# Patient Record
Sex: Female | Born: 1995 | Hispanic: Yes | Smoking: Former smoker
Health system: Southern US, Community
[De-identification: ages and names within clinical notes are randomized; demographics above are authoritative.]

## PROBLEM LIST (undated history)

## (undated) ENCOUNTER — Emergency Department (HOSPITAL_COMMUNITY): Discharge status: Against Medical Advice | Payer: Self-pay

## (undated) ENCOUNTER — Inpatient Hospital Stay (HOSPITAL_COMMUNITY): Payer: Self-pay

## (undated) DIAGNOSIS — A749 Chlamydial infection, unspecified: Secondary | ICD-10-CM

## (undated) DIAGNOSIS — T4145XA Adverse effect of unspecified anesthetic, initial encounter: Secondary | ICD-10-CM

## (undated) DIAGNOSIS — F32A Depression, unspecified: Secondary | ICD-10-CM

## (undated) DIAGNOSIS — O139 Gestational [pregnancy-induced] hypertension without significant proteinuria, unspecified trimester: Secondary | ICD-10-CM

## (undated) DIAGNOSIS — T8859XA Other complications of anesthesia, initial encounter: Secondary | ICD-10-CM

## (undated) DIAGNOSIS — F329 Major depressive disorder, single episode, unspecified: Secondary | ICD-10-CM

## (undated) DIAGNOSIS — F319 Bipolar disorder, unspecified: Secondary | ICD-10-CM

## (undated) HISTORY — DX: Major depressive disorder, single episode, unspecified: F32.9

## (undated) HISTORY — DX: Bipolar disorder, unspecified: F31.9

## (undated) HISTORY — DX: Depression, unspecified: F32.A

## (undated) HISTORY — DX: Chlamydial infection, unspecified: A74.9

## (undated) HISTORY — PX: HERNIA REPAIR: SHX51

---
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
## 2014-03-07 ENCOUNTER — Ambulatory Visit: Payer: Self-pay | Admitting: Gynecology

## 2014-03-13 ENCOUNTER — Ambulatory Visit: Payer: Self-pay | Admitting: Gynecology

## 2014-06-19 ENCOUNTER — Telehealth: Payer: Self-pay | Admitting: Gynecology

## 2014-06-19 NOTE — Telephone Encounter (Signed)
06/19/14-Blanca called patient and spoke w/her in Spanish to let her know the cost to remove her IUD is $216.74 as she has not yet met her deductible and that she would have to pay at least $100.00 when she comes in to have it removed towards the balance. Appt was made with JF/wl

## 2014-06-20 ENCOUNTER — Ambulatory Visit: Payer: BLUE CROSS/BLUE SHIELD | Admitting: Gynecology

## 2014-06-30 ENCOUNTER — Emergency Department (HOSPITAL_COMMUNITY)
Admission: EM | Admit: 2014-06-30 | Discharge: 2014-06-30 | Disposition: A | Discharge status: Home / Self Care | Payer: Self-pay | Attending: Emergency Medicine | Admitting: Emergency Medicine

## 2014-06-30 ENCOUNTER — Encounter (HOSPITAL_COMMUNITY): Payer: Self-pay | Admitting: Emergency Medicine

## 2014-06-30 DIAGNOSIS — Z8659 Personal history of other mental and behavioral disorders: Secondary | ICD-10-CM | POA: Insufficient documentation

## 2014-06-30 DIAGNOSIS — J3489 Other specified disorders of nose and nasal sinuses: Secondary | ICD-10-CM | POA: Insufficient documentation

## 2014-06-30 DIAGNOSIS — Z8619 Personal history of other infectious and parasitic diseases: Secondary | ICD-10-CM | POA: Insufficient documentation

## 2014-06-30 DIAGNOSIS — R0981 Nasal congestion: Secondary | ICD-10-CM | POA: Insufficient documentation

## 2014-06-30 DIAGNOSIS — Z87891 Personal history of nicotine dependence: Secondary | ICD-10-CM | POA: Insufficient documentation

## 2014-06-30 DIAGNOSIS — H65191 Other acute nonsuppurative otitis media, right ear: Secondary | ICD-10-CM | POA: Insufficient documentation

## 2014-06-30 MED ORDER — AMOXICILLIN 500 MG PO CAPS: 500.0000 mg | ORAL_CAPSULE | Freq: Two times a day (BID) | ORAL | Status: DC

## 2014-06-30 MED ORDER — IBUPROFEN 800 MG PO TABS: 800.0000 mg | ORAL_TABLET | Freq: Three times a day (TID) | ORAL | Status: DC

## 2014-06-30 NOTE — ED Notes (Signed)
Pt c/o R ear pain onset today and nasal congestion x 3-4 days, today noticed streaks of blood in mucus while blowing her nose.

## 2014-06-30 NOTE — ED Provider Notes (Signed)
CSN: [id number]     Arrival date & time 06/30/14  1755 History  This chart was scribed for non-physician practitioner, Jinny SandersJoseph Aeralyn Barna, PA-C working with Doug SouSam Jacubowitz, MD, by Abel PrestoKara Demonbreun, ED Scribe. This patient was seen in room WTR8/WTR8 and the patient's care was started at 6:32 PM.     Chief Complaint  Patient presents with  . Otalgia    Patient is a 19 y.o. female presenting with ear pain. The history is provided by the patient and the spouse. No language interpreter was used.  Otalgia Associated symptoms: congestion and rhinorrhea   Associated symptoms: no cough, no fever and no sore throat    HPI Comments: Jamie Hansen is a 19 y.o. female who presents to the Emergency Department complaining of right ear pain with onset today. Pt notes associated nasal congestion, sinus pressure, and blood tinged mucus blown from nose with onset 3-4 days ago. Pt with h/o of seasonal allergies. Pt has NKDA. Pt denies fever, chills, nausea, vomiting, cough, neck pain,  Past Medical History  Diagnosis Date  . Depression   . Bipolar 1 disorder   . Chlamydia    Past Surgical History  Procedure Laterality Date  . Cesarean section    . Hernia repair     Family History  Problem Relation Age of Onset  . Cancer Maternal Grandmother    History  Substance Use Topics  . Smoking status: Former Games developermoker  . Smokeless tobacco: Never Used  . Alcohol Use: No   OB History    Gravida Para Term Preterm AB TAB SAB Ectopic Multiple Living   1 1        1      Review of Systems  Constitutional: Negative for fever and chills.  HENT: Positive for congestion, ear pain, rhinorrhea and sinus pressure. Negative for sore throat.   Respiratory: Negative for cough.       Allergies  Review of patient's allergies indicates no known allergies.  Home Medications   Prior to Admission medications   Medication Sig Start Date End Date Taking? Authorizing Provider  amoxicillin (AMOXIL) 500 MG capsule Take 1  capsule (500 mg total) by mouth 2 (two) times daily. 06/30/14   Ladona MowJoe Nikky Duba, PA-C  fluconazole (DIFLUCAN) 150 MG tablet Take 1 tablet (150 mg total) by mouth once. 08/02/13   Harrington ChallengerNancy J Young, NP  ibuprofen (ADVIL,MOTRIN) 800 MG tablet Take 1 tablet (800 mg total) by mouth 3 (three) times daily. 06/30/14   Ladona MowJoe Jameek Bruntz, PA-C  tinidazole (TINDAMAX) 500 MG tablet Take four tablets today and four tablets tomorrow at the same time 04/16/13   Ok EdwardsJuan H Fernandez, MD   BP 140/85 mmHg  Pulse 103  Temp(Src) 98.4 F (36.9 C) (Oral)  Resp 17  SpO2 100% Physical Exam  Constitutional: She is oriented to person, place, and time. She appears well-developed and well-nourished.  HENT:  Head: Normocephalic.  Right Ear: Tympanic membrane is erythematous.  Left Ear: Tympanic membrane and external ear normal.  Nose: No nasal deformity, septal deviation or nasal septal hematoma. No epistaxis.  No foreign bodies. Right sinus exhibits maxillary sinus tenderness. Right sinus exhibits no frontal sinus tenderness. Left sinus exhibits maxillary sinus tenderness. Left sinus exhibits no frontal sinus tenderness.  Nares mildly erythematous bilaterally  Eyes: Conjunctivae and EOM are normal. Pupils are equal, round, and reactive to light.  Neck: Trachea normal, normal range of motion and full passive range of motion without pain. Neck supple. No spinous process tenderness and no muscular tenderness  present. No rigidity. No edema, no erythema and normal range of motion present. No Brudzinski's sign and no Kernig's sign noted.  Cardiovascular: Normal rate, regular rhythm and normal heart sounds.   Pulmonary/Chest: Effort normal and breath sounds normal. No respiratory distress. She has no wheezes. She has no rales.  Musculoskeletal: Normal range of motion.  Lymphadenopathy:    She has cervical adenopathy (mild anterior).  Neurological: She is alert and oriented to person, place, and time.  Skin: Skin is warm and dry.  Psychiatric: She  has a normal mood and affect. Her behavior is normal.  Nursing note and vitals reviewed.   ED Course  Procedures (including critical care time) DIAGNOSTIC STUDIES: Oxygen Saturation is 100% on room air, normal by my interpretation.    COORDINATION OF CARE: 6:43 PM Discussed treatment plan with patient at beside, the patient agrees with the plan and has no further questions at this time.   Labs Review Labs Reviewed - No data to display  Imaging Review No results found.   EKG Interpretation None      MDM   Final diagnoses:  Other acute nonsuppurative otitis media of right ear   Patient presents with otalgia and exam consistent with acute otitis media secondary to an upper respiratory infection. No concern for acute mastoiditis, meningitis.  No antibiotic use in the last month.  Patient discharged home with Amoxicillin. Advised patient to follow-up with primary care provider. I have also discussed reasons to return immediately to the ER.  Parent expresses understanding and agrees with plan.  I personally performed the services described in this documentation, which was scribed in my presence. The recorded information has been reviewed and is accurate.  BP 140/85 mmHg  Pulse 103  Temp(Src) 98.4 F (36.9 C) (Oral)  Resp 17  SpO2 100%  Signed,  Ladona MowJoe Vedanshi Massaro, PA-C 7:39 PM     Ladona MowJoe Antuan Limes, PA-C 06/30/14 1939  Doug SouSam Jacubowitz, MD 06/30/14 2348

## 2014-06-30 NOTE — Discharge Instructions (Signed)
Otitis media (Otitis Media) La otitis media es el enrojecimiento, el dolor y la inflamacin del odo [place]. La causa de la otitis media puede ser Vella Raring o, ms frecuentemente, una infeccin. Muchas veces ocurre como una complicacin de un resfro comn. SIGNOS Y SNTOMAS Los sntomas de la otitis media son:  Dolor de odos.  Grant Ruts.  Zumbidos en el odo.  Dolor de [place].  Prdida de lquido por el odo. DIAGNSTICO Para diagnosticar la otitis media, el mdico examinar su odo con un otoscopio. Este instrumento le permite al mdico observar el interior del odo y examinar el tmpano. El mdico le preguntar acerca de sus sntomas. TRATAMIENTO  Generalmente la otitis media mejora sin tratamiento entre 3 y los [address]. El mdico podr recetar algunos medicamentos para Primary school teacher. Si la otitis media no mejora dentro de los [address] o es recurrente, el mdico puede prescribir antibiticos si sospecha que la causa es una infeccin bacteriana. INSTRUCCIONES PARA EL CUIDADO EN EL HOGAR   Si le recetaron antibiticos, asegrese de terminarlos, incluso si comienza a sentirse mejor.  Tome los medicamentos solamente como se lo haya indicado el mdico.  Concurra a todas las visitas de control como se lo haya indicado el mdico. SOLICITE ATENCIN MDICA SI:  Tiene otitis media solo en un odo o sangra por la nariz, o ambas cosas.  Advierte un bulto en el cuello.  No mejora luego de 3 a 5 das.  Empeora en lugar de mejorar. SOLICITE ATENCIN MDICA DE INMEDIATO SI:   Aumenta el dolor y no puede controlarlo con Tourist information centre manager.  Observa hinchazn, irritacin o dolor alrededor del odo o rigidez en el cuello.  Nota que una parte de su rostro est paralizada.  Nota que el hueso que se encuentra detrs de la oreja (mastoides) le duele al tocarlo. ASEGRESE DE QUE:   Comprende estas instrucciones.  Controlar su afeccin.  Recibir ayuda de inmediato si no mejora o si  empeora. Document Released: 11/18/2004 Document Revised: 06/25/2013 [facility] Patient Information 2015 [place], [place]. This information is not intended to replace advice given to you by your health care provider. Make sure you discuss any questions you have with your health care provider.   Emergency Department Resource Guide 1) Find a Doctor and Pay Out of Pocket Although you won't have to find out who is covered by your insurance plan, it is a good idea to ask around and get recommendations. You will then need to call the office and see if the doctor you have chosen will accept you as a new patient and what types of options they offer for patients who are self-pay. Some doctors offer discounts or will set up payment plans for their patients who do not have insurance, but you will need to ask so you aren't surprised when you get to your appointment.  2) Contact Your Local Health Department Not all health departments have doctors that can see patients for sick visits, but many do, so it is worth a call to see if yours does. If you don't know where your local health department is, you can check in your phone book. The CDC also has a tool to help you locate your state's health department, and many state websites also have listings of all of their local health departments.  3) Find a Walk-in Clinic If your illness is not likely to be very severe or complicated, you may want to try a walk in clinic. These are popping up all  over the country in pharmacies, drugstores, and shopping centers. They're usually staffed by nurse practitioners or physician assistants that have been trained to treat common illnesses and complaints. They're usually fairly quick and inexpensive. However, if you have serious medical issues or chronic medical problems, these are probably not your best option.  No Primary Care Doctor: - Call Health Connect at  [phone] - they can help you locate a primary care doctor that  accepts your  insurance, provides certain services, etc. - Physician Referral Service- [phone]  Chronic Pain Problems: Organization         Address  Phone   Notes  [facility]  [phone]) [phone] Patients need to be referred by their primary care doctor.   Medication Assistance: Organization         Address  Phone   Notes  [facility] Medication [facility] [address] [place], [place] [address] [phone] --Must be a resident of [place] [facility] -- Must have NO insurance coverage whatsoever (no Medicaid/ Medicare, etc.) -- The pt. MUST have a primary care doctor that directs their care regularly and follows them in the community   MedAssist  [phone]   [employer]  [phone]    Agencies that provide inexpensive medical care: Organization         Address  Phone   Notes  [facility]  [phone]   [facility]    [phone]) [phone]   [facility] [address] [place], [place] [address] [phone]) [phone]   [facility] [address], [place] [phone]   Planned Parenthood    [phone]   [facility]    [phone]   [facility]  [address], [place] Phone:  [phone], Fax:  [phone] Hours of Operation:  9 am - 6 pm, M-F.  Also accepts Medicaid/Medicare and self-pay.  [facility]  [address], [place] Phone: [phone](336) [phone], Fax: [phone]. Hours of Operation:  8:30 am - 5:30 pm, M-F.  Also accepts Medicaid and self-pay.  [facility] [place] [address], [place] Phone: [phone](336) [phone]   [facility] [address], [place], [place] [phone], Ext. [phone] Mondays & Thursdays: 7-9 AM.  First 15 patients are seen on a first come, first serve basis.    Medicaid-accepting [facility] Providers:  Organization          Address  Phone   Notes  [facility] [address], [place] [phone] Also accepts self-pay patients.  [facility] [address], [place]  [phone]) [phone]   [facility] [address], [place] [phone]) [phone]   [facility] [address] [place] [address], [place] [phone]) [phone]   Renaye RakersVeita Bland [address], [place]   [phone](336) [phone] Only accepts [place] Access [place] patients after they have their name applied to their card.   Self-Pay (no insurance) in [facility] [place]:  Organization         Address  Phone   Notes  Sickle Cell Patients, [facility] [address], [place] [phone]   [facility] [address], [place] [phone]   [facility] [place]  [address], [place] [phone]   [facility]/Dr. Osei-Bonsu  [address] [place] [address], [place] or [address], [place] [phone]) [phone] Phone number for both [place] and [place] locations is the same.  [facility] [address], [place] [phone]   [facility] [place] [address] [place] [address], [place] or [address] [phone]) [phone]   [facility] [address], [place] [phone]) [phone], phone; [phone]) [phone], fax Sees patients 1st and 3rd Saturday of every month.  Must not qualify for public or private insurance (i.e. Medicaid, Medicare, [place] Health Choice, Veterans' Benefits)  Household income should be no more than 200% of the poverty level The clinic cannot treat you if you are pregnant or think you are pregnant  Sexually transmitted diseases are not treated at the clinic.    Dental Care: Organization         Address  Phone  Notes  [facility] of [facility] [address],  [place] [phone]) [phone] Accepts children up to age 821 who are enrolled in [place]icaid or [place] Health Choice; pregnant women with a Medicaid card; and children who have applied for Medicaid or [place] Health Choice, but were declined, whose parents can pay a reduced fee at time of service.  [facility] of [facility] [place]  [address], [place] [phone]) [phone] Accepts children up to age 221 who are enrolled in [place] or [place] Health Choice; pregnant women with a Medicaid card; and children who have applied for Medicaid or [place] Health Choice, but were declined, whose parents can pay a reduced fee at time of service.  [facility]  [address], [place] [phone] Patients are seen by appointment only. Walk-ins are not accepted. [facility] will see patients 20 years of age and older. Monday - Tuesday (8am-5pm) Most Wednesdays (8:30-5pm) $30 per visit, cash only  [facility]  [address], [facility] [phone] Patients are seen by appointment only. Walk-ins are not accepted. [facility] will see patients 20 years of age and older. One Wednesday Evening (Monthly: Volunteer Based).  $30 per visit, cash only  [employer] [facility]  [phone] for adults; Children under age 764, call Graduate Pediatric Dentistry at [phone]. Children aged 14-14, please call [phone] to request a pediatric application.  Dental services are provided in all areas of dental care including fillings, crowns and bridges, complete and partial dentures, implants, gum treatment, root canals, and extractions. Preventive care is also provided. Treatment is provided to both adults and children. Patients are selected via a lottery and there is often a waiting list.   [facility] [address], [place]  [phone] [email or web address]   [facility] [address], [place], [place]  [phone], Ext. [phone] Second and Fourth Thursday of each month, opens at 6:30 AM; Clinic ends at 9 AM.  Patients are seen on a first-come first-served basis, and a limited number are seen during each clinic.   [facility]  [address], [place], [place] [phone]) [phone]   Eligibility Requirements You must have lived in [place], [place], or [place] counties for at least the last three months.   You cannot be eligible for state or federal sponsored National Cityhealthcare insurance, including CIGNAVeterans Administration, [place]icaid, or Har[employer] EntertainmentMedicare.  You generally cannot be eligible for healthcare insurance through your employer.    How to apply: Eligibility screenings are held every Tuesday and Wednesday afternoon from 1:00 pm until 4:00 pm. You do not need an appointment for the interview!  [facility] [address], [place], [place]   [place]ole O[place]a  [place] Department  [place]  [phone]    Behavioral Health Resources in the Community: Intensive Outpatient Programs Organization         Address  Phone  Notes  Canova Potter. [address] [place], [place] [phone]   [facility] Outpatient [address] [place], [place]   ADS: Alcohol & Drug Svcs [address] [place], [place]   [place] [address]  [place], [place] or [phone]   Substance Abuse Resources Organization         Address  Phone  Notes  Alcohol and Drug Services  [phone]   [place]  [phone]   The [place]   Chinita Pester  [phone]   Residential & Outpatient Substance Abuse Program  [phone]   Psychological Services Organization         Address  Phone  Notes  [facility]  [place]  [phone]    [place] [address], [place] or [phone]    Mobile Crisis Teams Organization         Address  Phone  Notes  [facility]rapeutic [facility]atives, Mobile Crisis Care Unit  [phone]   Assertive Psychotherapeutic Services  [address] [place], [place]   [place] Levels [address], [place] [phone]    Self-Help/Support Groups Organization         Address  Phone             Notes  [place]. of [place] - variety of support groups  [place] Call for more information  Narcotics Anonymous (NA), [facility] [address], [employer] [place]  2 meetings at this location   Special educational needs teacher         Address  Phone  Notes  ASAP Residential Treatment [place],    [place]  [phone]   [facility]  [address], [place] [phone], [place], [place]   [place], [place] [phone] Admissions: 8am-3pm M-F  Incentives Substance [place] [address],    [place], [place] [phone]   [facility] [address] [place], [place], [place]   [facility] [address],  [place], [place]   Insight Programs - Intensive Outpatient [address], [place], [place]   [facility] ([place].,  [place], [place] [phone] or [phone]   [facility] Treatment Services (RTS) [address], [place], [place] Accepts Medicaid  [facility] [place] [address],  [place] [phone] Substance Abuse/Addiction Treatment   [facility] Organization         Address  Phone  Notes  [facility]  [phone]   Domenic Schwab, PhD [address] [place], [place]   [phone] or [phone]   [facility]   [phone]  [address] [place], [place] [phone]   [facility] [address] [place], [place] [phone] Insurance/Medicaid/sponsorship through [facility] and Families [address], Ste [place], [place] [phone] [place], [place] [phone]    Dr. Adele Schilder  [phone]   [facility] Dept. 1) [address], [place] 2) [place] 3)  [place] 65, [place] [phone]  [phone]   [place] [phone] or [phone] (After Hours)

## 2014-08-07 ENCOUNTER — Encounter: Payer: Self-pay | Admitting: Gynecology

## 2014-08-07 ENCOUNTER — Other Ambulatory Visit: Payer: Self-pay | Admitting: Gynecology

## 2014-08-07 ENCOUNTER — Ambulatory Visit (INDEPENDENT_AMBULATORY_CARE_PROVIDER_SITE_OTHER): Payer: BLUE CROSS/BLUE SHIELD

## 2014-08-07 ENCOUNTER — Ambulatory Visit (INDEPENDENT_AMBULATORY_CARE_PROVIDER_SITE_OTHER): Payer: BLUE CROSS/BLUE SHIELD | Admitting: Gynecology

## 2014-08-07 VITALS — BP 118/80 | Ht <= 58 in | Wt 112.0 lb

## 2014-08-07 DIAGNOSIS — R300 Dysuria: Secondary | ICD-10-CM

## 2014-08-07 DIAGNOSIS — T8332XA Displacement of intrauterine contraceptive device, initial encounter: Secondary | ICD-10-CM

## 2014-08-07 DIAGNOSIS — Z309 Encounter for contraceptive management, unspecified: Secondary | ICD-10-CM

## 2014-08-07 DIAGNOSIS — Z975 Presence of (intrauterine) contraceptive device: Principal | ICD-10-CM

## 2014-08-07 DIAGNOSIS — T8389XA Other specified complication of genitourinary prosthetic devices, implants and grafts, initial encounter: Secondary | ICD-10-CM

## 2014-08-07 DIAGNOSIS — N921 Excessive and frequent menstruation with irregular cycle: Secondary | ICD-10-CM

## 2014-08-07 DIAGNOSIS — N760 Acute vaginitis: Secondary | ICD-10-CM

## 2014-08-07 DIAGNOSIS — N852 Hypertrophy of uterus: Secondary | ICD-10-CM

## 2014-08-07 DIAGNOSIS — N898 Other specified noninflammatory disorders of vagina: Secondary | ICD-10-CM

## 2014-08-07 DIAGNOSIS — B9689 Other specified bacterial agents as the cause of diseases classified elsewhere: Secondary | ICD-10-CM

## 2014-08-07 DIAGNOSIS — Z113 Encounter for screening for infections with a predominantly sexual mode of transmission: Secondary | ICD-10-CM

## 2014-08-07 DIAGNOSIS — A499 Bacterial infection, unspecified: Secondary | ICD-10-CM

## 2014-08-07 DIAGNOSIS — Z789 Other specified health status: Secondary | ICD-10-CM

## 2014-08-07 DIAGNOSIS — Z30432 Encounter for removal of intrauterine contraceptive device: Secondary | ICD-10-CM

## 2014-08-07 LAB — URINALYSIS W MICROSCOPIC + REFLEX CULTURE
BILIRUBIN URINE: NEGATIVE
CRYSTALS: NONE SEEN
Casts: NONE SEEN
Glucose, UA: NEGATIVE mg/dL
Ketones, ur: NEGATIVE mg/dL
LEUKOCYTES UA: NEGATIVE
NITRITE: NEGATIVE
Protein, ur: NEGATIVE mg/dL
SPECIFIC GRAVITY, URINE: 1.02 (ref 1.005–1.030)
UROBILINOGEN UA: 0.2 mg/dL (ref 0.0–1.0)
WBC, UA: NONE SEEN WBC/hpf (ref <3)
pH: 5 (ref 5.0–8.0)

## 2014-08-07 LAB — WET PREP FOR TRICH, YEAST, CLUE
Trich, Wet Prep: NONE SEEN
Yeast Wet Prep HPF POC: NONE SEEN

## 2014-08-07 LAB — PREGNANCY, URINE: PREG TEST UR: NEGATIVE

## 2014-08-07 MED ORDER — ULIPRISTAL ACETATE 30 MG PO TABS: 1.0000 | ORAL_TABLET | Freq: Once | ORAL | Status: DC

## 2014-08-07 MED ORDER — TINIDAZOLE 500 MG PO TABS: ORAL_TABLET | ORAL | Status: DC

## 2014-08-07 NOTE — Patient Instructions (Addendum)
Tinidazole tablets What is this medicine? TINIDAZOLE (tye NI da zole) is an antiinfective. It is used to treat amebiasis, giardiasis, trichomoniasis, and vaginosis. It will not work for colds, flu, or other viral infections. This medicine may be used for other purposes; ask your health care provider or pharmacist if you have questions. COMMON BRAND NAME(S): Tindamax What should I tell my health care provider before I take this medicine? They need to know if you have any of these conditions: -anemia or other blood disorders -if you frequently drink alcohol containing drinks -receiving hemodialysis -seizure disorder -an unusual or allergic reaction to tinidazole, other medicines, foods, dyes, or preservatives -pregnant or trying to get pregnant -breast-feeding How should I use this medicine? Take this medicine by mouth with a full glass of water. Follow the directions on the prescription label. Take with food. Take your medicine at regular intervals. Do not take your medicine more often than directed. Take all of your medicine as directed even if you think you are better. Do not skip doses or stop your medicine early. Talk to your pediatrician regarding the use of this medicine in children. While this drug may be prescribed for children as young as 3 years of age for selected conditions, precautions do apply. Overdosage: If you think you have taken too much of this medicine contact a poison control center or emergency room at once. NOTE: This medicine is only for you. Do not share this medicine with others. What if I miss a dose? If you miss a dose, take it as soon as you can. If it is almost time for your next dose, take only that dose. Do not take double or extra doses. What may interact with this medicine? Do not take this medicine with any of the following medications: -alcohol or any product that contains alcohol -amprenavir oral solution -disulfiram -paclitaxel injection -ritonavir  oral solution -sertraline oral solution -sulfamethoxazole-trimethoprim injection This medicine may also interact with the following medications: -cholestyramine -cimetidine -conivaptan -cyclosporin -fluorouracil -fosphenytoin, phenytoin -ketoconazole -lithium -phenobarbital -tacrolimus -warfarin This list may not describe all possible interactions. Give your health care provider a list of all the medicines, herbs, non-prescription drugs, or dietary supplements you use. Also tell them if you smoke, drink alcohol, or use illegal drugs. Some items may interact with your medicine. What should I watch for while using this medicine? Tell your doctor or health care professional if your symptoms do not improve or if they get worse. Avoid alcoholic drinks while you are taking this medicine and for three days afterward. Alcohol may make you feel dizzy, sick, or flushed. If you are being treated for a sexually transmitted disease, avoid sexual contact until you have finished your treatment. Your sexual partner may also need treatment. What side effects may I notice from receiving this medicine? Side effects that you should report to your doctor or health care professional as soon as possible: -allergic reactions like skin rash, itching or hives, swelling of the face, lips, or tongue -breathing problems -confusion, depression -dark or white patches in the mouth -feeling faint or lightheaded, falls -fever, infection -numbness, tingling, pain or weakness in the hands or feet -pain when passing urine -seizures -unusually weak or tired -vaginal irritation or discharge -vomiting Side effects that usually do not require medical attention (report to your doctor or health care professional if they continue or are bothersome): -dark brown or reddish urine -diarrhea -headache -loss of appetite -metallic taste -nausea -stomach upset This list may not describe all   possible side effects. Call your  doctor for medical advice about side effects. You may report side effects to FDA at 1-800-FDA-1088. Where should I keep my medicine? Keep out of the reach of children. Store at room temperature between 15 and 30 degrees C (59 and 86 degrees F). Protect from light and moisture. Keep container tightly closed. Throw away any unused medicine after the expiration date. NOTE: This sheet is a summary. It may not cover all possible information. If you have questions about this medicine, talk to your doctor, pharmacist, or health care provider.  2015, Elsevier/Gold Standard. (2007-11-06 15:22:28) Bacterial Vaginosis Bacterial vaginosis is a vaginal infection that occurs when the normal balance of bacteria in the vagina is disrupted. It results from an overgrowth of certain bacteria. This is the most common vaginal infection in women of childbearing age. Treatment is important to prevent complications, especially in pregnant women, as it can cause a premature delivery. CAUSES  Bacterial vaginosis is caused by an increase in harmful bacteria that are normally present in smaller amounts in the vagina. Several different kinds of bacteria can cause bacterial vaginosis. However, the reason that the condition develops is not fully understood. RISK FACTORS Certain activities or behaviors can put you at an increased risk of developing bacterial vaginosis, including:  Having a new sex partner or multiple sex partners.  Douching.  Using an intrauterine device (IUD) for contraception. Women do not get bacterial vaginosis from toilet seats, bedding, swimming pools, or contact with objects around them. SIGNS AND SYMPTOMS  Some women with bacterial vaginosis have no signs or symptoms. Common symptoms include:  Grey vaginal discharge.  A fishlike odor with discharge, especially after sexual intercourse.  Itching or burning of the vagina and vulva.  Burning or pain with urination. DIAGNOSIS  Your health care  provider will take a medical history and examine the vagina for signs of bacterial vaginosis. A sample of vaginal fluid may be taken. Your health care provider will look at this sample under a microscope to check for bacteria and abnormal cells. A vaginal pH test may also be done.  TREATMENT  Bacterial vaginosis may be treated with antibiotic medicines. These may be given in the form of a pill or a vaginal cream. A second round of antibiotics may be prescribed if the condition comes back after treatment.  HOME CARE INSTRUCTIONS   Only take over-the-counter or prescription medicines as directed by your health care provider.  If antibiotic medicine was prescribed, take it as directed. Make sure you finish it even if you start to feel better.  Do not have sex until treatment is completed.  Tell all sexual partners that you have a vaginal infection. They should see their health care provider and be treated if they have problems, such as a mild rash or itching.  Practice safe sex by using condoms and only having one sex partner. SEEK MEDICAL CARE IF:   Your symptoms are not improving after 3 days of treatment.  You have increased discharge or pain.  You have a fever. MAKE SURE YOU:   Understand these instructions.  Will watch your condition.  Will get help right away if you are not doing well or get worse. FOR MORE INFORMATION  Centers for Disease Control and Prevention, Division of STD Prevention: www.cdc.gov/std American Sexual Health Association (ASHA): www.ashastd.org  Document Released: 02/08/2005 Document Revised: 11/29/2012 Document Reviewed: 09/20/2012 ExitCare Patient Information 2015 ExitCare, LLC. This information is not intended to replace advice given to you   by your health care provider. Make sure you discuss any questions you have with your health care provider. Ulipristal oral tablets Qu es este medicamento? El ULIPRISTAL es un mtodo anticonceptivo de Associate Professor.  Este medicamento evita el embarazo si lo [place] dentro de 5 das (120 horas) de que se produce un fallo en el control de la natalidad o una relacin sexual sin proteccin. Este medicamento no es eficaz si ya est embarazada. Este medicamento puede ser utilizado para otros usos; si tiene alguna pregunta consulte con su proveedor de atencin mdica o con su farmacutico. MARCAS COMERCIALES DISPONIBLES: Marguerita Beards le debo informar a mi profesional de la salud antes de tomar este medicamento? Necesita saber si usted presenta alguno de los siguientes problemas o situaciones: -una reaccin alrgica o inusual al ulipristal, a otros medicamentos, alimentos, colorantes o conservantes -si est embarazada o buscando quedar embarazada -si est amamantando a un beb Cmo debo utilizar este medicamento? Tome este medicamento por va oral con o sin alimentos. Su mdico podr pedirle que se realice una prueba de [place] de respuesta rpida antes de usar estas tabletas. Tome su medicamento lo antes posible y antes de 5 das (120 horas) despus de las relaciones. Este medicamento se puede tomar en cualquier momento de su ciclo menstrual. Deber seguir las instrucciones de su proveedor de atencin mdica al pie de Scientific laboratory technician. Comunquese con su proveedor de atencin mdica inmediatamente si usted vomitar dentro de 3 horas de tomar su medicamento para discutir si usted necesita tomar otra tableta. Usted recibir un prospecto para el paciente para este producto con cada receta y relleno. Asegrese de leer este folleto cada vez cuidadosamente. Este folleto puede cambiar con frecuencia. Hable con su pediatra para informarse acerca del uso de este medicamento en nios. Puede requerir atencin especial. Sobredosis: Pngase en contacto inmediatamente con un centro toxicolgico o una sala de urgencia si usted cree que haya tomado demasiado medicamento. ATENCIN: [employer] es solo para usted. No comparta este medicamento con  nadie. Qu sucede si me olvido de una dosis? No se aplica en este caso. Este medicamento no es para uso regular. Qu puede interactuar con este medicamento? Esta medicina tambin puede interactuar con los siguientes medicamentos: -pldoras anticonceptivas -bosentano -ciertos medicamentos para infecciones micticas, tales como griseofulvina, itraconazol y quetoconazol -ciertos medicamentos para convulsiones, tales como barbitricos, carbamazepina, [place], [place], [place], Marine scientist -dabigatrn -digoxina -rifampicina -hierba de [address] ser que esta lista no menciona todas las posibles interacciones. Informe a su profesional de [employer] de [employer] productos a base de hierbas, medicamentos de [place] o suplementos nutritivos que est tomando. Si usted fuma, consume bebidas alcohlicas o si utiliza drogas ilegales, indqueselo tambin a su profesional de [employer]. Algunas sustancias pueden interactuar con su medicamento. A qu debo estar atento al usar [employer]? Su perodo puede [employer] o despus de lo esperado. Si su periodo tarda ms de [address] el embarazo es posible. Consulte a su proveedor de atencin mdica tan pronto como puede y realizarse una prueba de Psychiatrist. Hablar con su proveedor de atencin mdica antes de tomar este medicamento si sabe o sospecha que est embarazada. Pngase en contacto con su proveedor de atencin mdica si cree que puede estar embarazada y ha [employer]. Su proveedor de atencin mdica tal vez desee proporcionar informacin sobre su embarazo para ayudar a Education administrator seguridad de Building surveyor. Para obtener informacin, visite [email or web address]. Si tiene dolor abdominal grave alrededor  de 3 a 5 semanas despus de tomar [place], puede que tenga un embarazo fuera del tero, lo que se llama un embarazo ectpico o tubrico. Llame a su proveedor de atencin mdica o ir a la sala  de emergencias ms cercana inmediatamente si piensa que esto est ocurriendo. Debe consultar a su proveedor de atencin mdica acerca de las diversas opciones de mtodos anticonceptivo. Anticonceptivos de emergencia no deben utilizarse como mtodo habitual de prevencin del Psychiatrist. No se debe usar ms que una vez durante un ciclo menstrual. Las pldoras anticonceptivas pueden no actuar correctamente mientras est utilizando este medicamento. Espere al menos 5 das despus de tomar este medicamento para iniciar o continuar otro anticonceptivo hormonal basado. Asegrese de usar un mtodo anticonceptivo de barrera confiable (como un condn con espermicida) entre el momento de que toma este medicamento y su prximo periodo. Este medicamento no la protege de la infeccin por VIH (SIDA) ni de ninguna otra enfermedad de transmisin sexual. Qu efectos secundarios puedo tener al utilizar este medicamento? Efectos secundarios que debe informar a su mdico o a Producer, television/film/video de la salud tan pronto como sea posible: -reacciones alrgicas como erupcin cutnea, picazn o urticarias, hinchazn de la cara, labios o lengua Efectos secundarios que, por lo general, no requieren atencin mdica (debe informarlos a su mdico o a su profesional de la salud si persisten o si son molestos): -mareos -dolor de cabeza -nuseas -manchado -dolor estomacal -cansancio Puede ser que esta lista no menciona todos los posibles efectos secundarios. Comunquese a su mdico por asesoramiento mdico [employer]. Usted puede informar los efectos secundarios a la FDA por telfono al 1-800-FDA-1088. Dnde debo guardar mi medicina? Mantngala fuera del alcance de los nios. [place]a a [employer], entre 20 y 25 grados C (39 y 73 grados F). Protjala de la luz y [place] en la tarjeta dentro del envase original hasta que est listo para tomarla. Deseche todo el medicamento que no haya utilizado, despus de la  fecha de vencimiento. ATENCIN: Este folleto es un resumen. Puede ser que no cubra toda la posible informacin. Si usted tiene preguntas acerca de esta medicina, consulte con su mdico, su farmacutico o su profesional de Radiographer, therapeutic.  2015, Elsevier/Gold Standard. (2013-05-21 13:13:22)

## 2014-08-07 NOTE — Progress Notes (Signed)
   Patient is a 19 year old gravida 1 para 1 who had a Mirena IUD placed 2 years ago. Patient presented to the office today complaining of some dysuria and also having some vaginal bleeding although she describes it as being light for the past 7 days. She also feels vaginal irritation. Patient has been with her current partner for 8 months. Patient had been in the office in June of last year and had a full STD screen which was negative. Patient has had history in the past of chlamydia infection.  Exam: Blood pressure 118/80 Gen. 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 Enlarged uterus anteflex homogeneous myometrium, IUD was seen in the lower uterine segment and right and left ovary were otherwise normal.  Assessment/plan: #1 bacterial vaginosis will be treated with Tindamax 500 mg 4 tablets today and 4 tablets in 24 hours #2 migration of IUD patient's had some light bleeding for several days had intercourse last weekend we'll check urine pregnancy test today if negative she will be prescribed emergency contraception with  Ella 30 mg tablets today. The cervix was cleansed with Betadine solution and the IUD was removed. She will use barrier contraception and await for the next menstrual cycle to replace the IUD.

## 2014-08-08 LAB — GC/CHLAMYDIA PROBE AMP
CT PROBE, AMP APTIMA: NEGATIVE
GC Probe RNA: NEGATIVE

## 2014-11-05 ENCOUNTER — Encounter: Payer: BLUE CROSS/BLUE SHIELD | Admitting: Gynecology

## 2014-11-12 ENCOUNTER — Encounter: Payer: BLUE CROSS/BLUE SHIELD | Admitting: Gynecology

## 2015-01-07 ENCOUNTER — Emergency Department (HOSPITAL_COMMUNITY)
Admission: EM | Admit: 2015-01-07 | Discharge: 2015-01-07 | Disposition: A | Discharge status: Home / Self Care | Payer: Self-pay | Attending: Emergency Medicine | Admitting: Emergency Medicine

## 2015-01-07 ENCOUNTER — Emergency Department (HOSPITAL_COMMUNITY): Payer: Self-pay

## 2015-01-07 ENCOUNTER — Encounter (HOSPITAL_COMMUNITY): Payer: Self-pay | Admitting: Emergency Medicine

## 2015-01-07 DIAGNOSIS — Z87891 Personal history of nicotine dependence: Secondary | ICD-10-CM | POA: Insufficient documentation

## 2015-01-07 DIAGNOSIS — N72 Inflammatory disease of cervix uteri: Secondary | ICD-10-CM | POA: Insufficient documentation

## 2015-01-07 DIAGNOSIS — Z3202 Encounter for pregnancy test, result negative: Secondary | ICD-10-CM | POA: Insufficient documentation

## 2015-01-07 DIAGNOSIS — Z8619 Personal history of other infectious and parasitic diseases: Secondary | ICD-10-CM | POA: Insufficient documentation

## 2015-01-07 DIAGNOSIS — Z8659 Personal history of other mental and behavioral disorders: Secondary | ICD-10-CM | POA: Insufficient documentation

## 2015-01-07 DIAGNOSIS — Z91040 Latex allergy status: Secondary | ICD-10-CM | POA: Insufficient documentation

## 2015-01-07 LAB — CBC
HCT: 39.2 % (ref 36.0–46.0)
Hemoglobin: 13.6 g/dL (ref 12.0–15.0)
MCH: 29.3 pg (ref 26.0–34.0)
MCHC: 34.7 g/dL (ref 30.0–36.0)
MCV: 84.5 fL (ref 78.0–100.0)
Platelets: 257 10*3/uL (ref 150–400)
RBC: 4.64 MIL/uL (ref 3.87–5.11)
RDW: 13.3 % (ref 11.5–15.5)
WBC: 12.9 10*3/uL — ABNORMAL HIGH (ref 4.0–10.5)

## 2015-01-07 LAB — URINALYSIS, ROUTINE W REFLEX MICROSCOPIC
Bilirubin Urine: NEGATIVE
GLUCOSE, UA: NEGATIVE mg/dL
Ketones, ur: NEGATIVE mg/dL
LEUKOCYTES UA: NEGATIVE
Nitrite: NEGATIVE
PH: 5.5 (ref 5.0–8.0)
Protein, ur: NEGATIVE mg/dL
SPECIFIC GRAVITY, URINE: 1.018 (ref 1.005–1.030)

## 2015-01-07 LAB — URINE MICROSCOPIC-ADD ON: BACTERIA UA: NONE SEEN

## 2015-01-07 LAB — COMPREHENSIVE METABOLIC PANEL
ALBUMIN: 4.1 g/dL (ref 3.5–5.0)
ALT: 83 U/L — ABNORMAL HIGH (ref 14–54)
ANION GAP: 10 (ref 5–15)
AST: 148 U/L — AB (ref 15–41)
Alkaline Phosphatase: 65 U/L (ref 38–126)
BILIRUBIN TOTAL: 0.9 mg/dL (ref 0.3–1.2)
BUN: 10 mg/dL (ref 6–20)
CALCIUM: 9.5 mg/dL (ref 8.9–10.3)
CO2: 26 mmol/L (ref 22–32)
Chloride: 102 mmol/L (ref 101–111)
Creatinine, Ser: 0.79 mg/dL (ref 0.44–1.00)
GFR calc Af Amer: >60 mL/min (ref >60)
GFR calc non Af Amer: >60 mL/min (ref >60)
GLUCOSE: 96 mg/dL (ref 65–99)
Potassium: 4.1 mmol/L (ref 3.5–5.1)
Sodium: 138 mmol/L (ref 135–145)
TOTAL PROTEIN: 7 g/dL (ref 6.5–8.1)

## 2015-01-07 LAB — WET PREP, GENITAL
Sperm: NONE SEEN
Trich, Wet Prep: NONE SEEN
YEAST WET PREP: NONE SEEN

## 2015-01-07 LAB — I-STAT BETA HCG BLOOD, ED (MC, WL, AP ONLY): I-stat hCG, quantitative: <5 m[IU]/mL (ref <5)

## 2015-01-07 MED ORDER — DOXYCYCLINE HYCLATE 100 MG PO TABS
100.0000 mg | ORAL_TABLET | Freq: Once | ORAL | Status: AC
  Administered 2015-01-07: 100 mg via ORAL
  Filled 2015-01-07: qty 1

## 2015-01-07 MED ORDER — METRONIDAZOLE 500 MG PO TABS: 500.0000 mg | ORAL_TABLET | Freq: Two times a day (BID) | ORAL | Status: DC

## 2015-01-07 MED ORDER — DOXYCYCLINE HYCLATE 100 MG PO CAPS: 100.0000 mg | ORAL_CAPSULE | Freq: Two times a day (BID) | ORAL | Status: DC

## 2015-01-07 MED ORDER — CEFTRIAXONE SODIUM 250 MG IJ SOLR
250.0000 mg | Freq: Once | INTRAMUSCULAR | Status: AC
  Administered 2015-01-07: 250 mg via INTRAMUSCULAR
  Filled 2015-01-07: qty 250

## 2015-01-07 NOTE — ED Notes (Signed)
MD at bedside. 

## 2015-01-07 NOTE — ED Notes (Signed)
Pt sts lower abd pain and bloating after her periods x 4 months since having her merena removed; pt sts some constipation as well

## 2015-01-07 NOTE — Discharge Instructions (Signed)
Cervicitis (Cervicitis) Es el dolor e hinchazn (inflamacin) del cuello uterino. El cuello del tero se encuentra en la parte inferior del tero. Se abre hacia la vagina. CAUSAS   Enfermedades de transmisin sexual (ETS).   Reacciones alrgicas.   Medicamentos o dispositivos anticonceptivos que se Research officer, trade unioncolocan en la vagina.   Traumatismo en el cuello del tero.   Infecciones bacterianas.  FACTORES DE RIESGO Usted tendr mayor riesgo de sufrir esta enfermedad si:  Tiene relaciones sexuales sin proteccin.  Ha tenido relaciones sexuales con varias parejas.  Comienza a Management consultanttener relaciones sexuales a una edad temprana.  Tiene una historia de ETS. SNTOMAS  Puede ser que no haya sntomas. Si hay sntomas, pueden ser:   Secrecin vaginal de color gris, blanco, amarillo o que tiene mal olor.   Picazn o dolor en la zona externa de la vagina.   Relaciones sexuales dolorosas.   Dolor en la zona baja del abdomen o la espalda, especialmente durante las The St. Paul Travelersrelaciones sexuales.   Ganas de orinar con frecuencia.   Sangrado vaginal anormal entre perodos, despus de las relaciones sexuales o despus de la menopausia.   Sensacin de presin o pesadez en la pelvis.  DIAGNSTICO  El diagnstico se realiza mediante un examen plvico. Otras pruebas son:   Examen de las secreciones en el microscopio (preparado fresco).   Papanicolau  TRATAMIENTO  El tratamiento depender de la causa de la cervicitis. Si la causa es una enfermedad de transmisin sexual, usted y su pareja necesitarn Pensions consultantrealizar un tratamiento. Le prescribirn antibiticos.  INSTRUCCIONES PARA EL CUIDADO EN EL HOGAR   No tenga relaciones sexuales hasta que el mdico la autorice.   No tenga relaciones sexuales hasta que su compaero haya sido tratado si la causa de la cervicitis es una enfermedad de transmisin sexual.   Tome los antibiticos como se le indic. Tmelos todos, aunque se sienta mejor.  SOLICITE  ATENCIN MDICA SI:  Los sntomas vuelven a Research officer, trade unionaparecer.   Tiene fiebre.  ASEGRESE DE QUE:   Comprende estas instrucciones.  Controlar su afeccin.  Recibir ayuda de inmediato si no mejora o si empeora.   Esta informacin no tiene Theme park managercomo fin reemplazar el consejo del mdico. Asegrese de hacerle al mdico cualquier pregunta que tenga.   Document Released: 02/08/2005 Document Revised: 10/11/2012 Elsevier Interactive Patient Education [employer]2016 Elsevier Inc.

## 2015-01-07 NOTE — ED Provider Notes (Signed)
CSN: [id number]     Arrival date & time 01/07/15  1732 History   First MD Initiated Contact with Patient 01/07/15 1818     Chief Complaint  Patient presents with  . Abdominal Pain     (Consider location/radiation/quality/duration/timing/severity/associated sxs/prior Treatment) Patient is a 19 y.o. female presenting with abdominal pain. The history is provided by the patient.  Abdominal Pain Pain location:  LLQ and RLQ Pain quality: aching and cramping   Pain radiates to:  Does not radiate Pain severity:  Moderate Onset quality:  Gradual Duration:  4 weeks Timing:  Constant Progression:  Worsening Chronicity:  New Relieved by:  Nothing Worsened by:  Nothing tried Ineffective treatments:  None tried Associated symptoms: no chest pain, no chills, no diarrhea, no dysuria, no fever, no nausea, no shortness of breath, no vaginal bleeding, no vaginal discharge and no vomiting     19 year old female with a chief complaints of lower abdominal cramping been going on for about a week. Patient had her IUD removed to this pain. Patient has not spoke with her GYN about this issue. Patient denies vaginal bleeding or vaginal discharge. Denies fevers or chills.  Past Medical History  Diagnosis Date  . Depression   . Bipolar 1 disorder (HCC)   . Chlamydia    Past Surgical History  Procedure Laterality Date  . Cesarean section    . Hernia repair     Family History  Problem Relation Age of Onset  . Cancer Maternal Grandmother    Social History  Substance Use Topics  . Smoking status: Former Games developer  . Smokeless tobacco: Never Used  . Alcohol Use: No   OB History    Gravida Para Term Preterm AB TAB SAB Ectopic Multiple Living   Review of Systems  Constitutional: Negative for fever and chills.  HENT: Negative for congestion and rhinorrhea.   Eyes: Negative for redness and visual disturbance.  Respiratory: Negative for shortness of breath and wheezing.    Cardiovascular: Negative for chest pain and palpitations.  Gastrointestinal: Positive for abdominal pain. Negative for nausea, vomiting and diarrhea.  Genitourinary: Negative for dysuria, urgency, vaginal bleeding and vaginal discharge.  Musculoskeletal: Negative for myalgias and arthralgias.  Skin: Negative for pallor and wound.  Neurological: Negative for dizziness and headaches.      Allergies  Latex  Home Medications   Prior to Admission medications   Medication Sig Start Date End Date Taking? Authorizing Provider  doxycycline (VIBRAMYCIN) 100 MG capsule Take 1 capsule (100 mg total) by mouth 2 (two) times daily. 01/07/15   Melene Plan, DO  metroNIDAZOLE (FLAGYL) 500 MG tablet Take 1 tablet (500 mg total) by mouth 2 (two) times daily. 01/07/15   Melene Plan, DO  tinidazole (TINDAMAX) 500 MG tablet Take four tablets today and four tablets tomorrow at the same time Patient not taking: Reported on 01/07/2015 08/07/14   Ok Edwards, MD  ulipristal acetate (ELLA) 30 MG tablet Take 1 tablet (30 mg total) by mouth once. Patient not taking: Reported on 01/07/2015 08/07/14   Ok Edwards, MD   BP 138/93 mmHg  Pulse 87  Temp(Src) 98.5 F (36.9 C) (Oral)  Resp 20  SpO2 100% Physical Exam  Constitutional: She is oriented to person, place, and time. She appears well-developed and well-nourished. No distress.  HENT:  Head: Normocephalic and atraumatic.  Eyes: EOM are normal. Pupils are equal, round, and reactive to  light.  Neck: Normal range of motion. Neck supple.  Cardiovascular: Normal rate and regular rhythm.  Exam reveals no gallop and no friction rub.   No murmur heard. Pulmonary/Chest: Effort normal. She has no wheezes. She has no rales.  Abdominal: Soft. She exhibits no distension. There is no tenderness. There is no rebound and no guarding.  Genitourinary: Cervix exhibits motion tenderness. Right adnexum displays no mass and no tenderness. Left adnexum displays no mass and  no tenderness.  Copious discharge  Musculoskeletal: She exhibits no edema or tenderness.  Neurological: She is alert and oriented to person, place, and time.  Skin: Skin is warm and dry. She is not diaphoretic.  Psychiatric: She has a normal mood and affect. Her behavior is normal.  Nursing note and vitals reviewed.   ED Course  Procedures (including critical care time) Labs Review Labs Reviewed  WET PREP, GENITAL - Abnormal; Notable for the following:    Clue Cells Wet Prep HPF POC PRESENT (*)    WBC, Wet Prep HPF POC MODERATE (*)    All other components within normal limits  COMPREHENSIVE METABOLIC PANEL - Abnormal; Notable for the following:    AST 148 (*)    ALT 83 (*)    All other components within normal limits  CBC - Abnormal; Notable for the following:    WBC 12.9 (*)    All other components within normal limits  URINALYSIS, ROUTINE W REFLEX MICROSCOPIC (NOT AT [facility]) - Abnormal; [facility]:    APPearance CLOUDY (*)    Hgb urine dipstick TRACE (*)    All other components within normal limits  URINE MICROSCOPIC-ADD ON - Abnormal; Notable for the following:    Squamous Epithelial / LPF 0-5 (*)    All other components within normal limits  HIV ANTIBODY (ROUTINE TESTING)  I-STAT BETA HCG BLOOD, ED ([facility], [facility], [facility] ONLY)  GC/CHLAMYDIA PROBE AMP ([place]) NOT AT [facility]    Imaging Review Dg Abd Acute W/chest  01/07/2015  CLINICAL DATA:  Pt sts lower abd pain and bloating after her periods x 4 months since having her merena removed; pt sts some constipation as well EXAM: DG ABDOMEN ACUTE W/ 1V CHEST COMPARISON:  None. FINDINGS: Linear scarring or subsegmental atelectasis in the right lower lung. Left lung clear. Heart size normal. No effusion. No free air. Normal bowel gas pattern. There are no abnormal calcifications. Regional bones unremarkable. IMPRESSION: 1. Linear scar or atelectasis in the right lower lung. 2. Negative abdominal radiographs. Electronically  Signed   By: Corlis Leak M.D.   On: 01/07/2015 18:23   I have personally reviewed and evaluated these images and lab results as part of my medical decision-making.   EKG Interpretation None      MDM   Final diagnoses:  Cervicitis    19 yo F with a chief complaint of lower abdominal pain. Pelvic exam concerning for PID will treat. UA negative, acute abd ordered from triage negative. Preg negative.     Patient also has BV on wet prep. Will treat. GYN follow-up.  8:41 PM:  I have discussed the diagnosis/risks/treatment options with the patient and family and believe the pt to be eligible for discharge home to follow-up with Gyn. We also discussed returning to the ED immediately if new or worsening sx occur. We discussed the sx which are most concerning (e.g., sudden worsening pain, fever, inability to tolerate by mouth) that necessitate immediate return. Medications administered to the patient during their  visit and any new prescriptions provided to the patient are listed below.  Medications given during this visit Medications  cefTRIAXone (ROCEPHIN) injection 250 mg (250 mg Intramuscular Given 01/07/15 1945)  doxycycline (VIBRA-TABS) tablet 100 mg (100 mg Oral Given 01/07/15 1945)    New Prescriptions   DOXYCYCLINE (VIBRAMYCIN) 100 MG CAPSULE    Take 1 capsule (100 mg total) by mouth 2 (two) times daily.   METRONIDAZOLE (FLAGYL) 500 MG TABLET    Take 1 tablet (500 mg total) by mouth 2 (two) times daily.    The patient appears reasonably screen and/or stabilized for discharge and I doubt any other medical condition or other [facility] requiring further screening, evaluation, or treatment in the ED at this time prior to discharge.    Melene Planan Agustus Mane, DO 01/07/15 2042

## 2015-01-08 LAB — HIV ANTIBODY (ROUTINE TESTING W REFLEX): HIV SCREEN 4TH GENERATION: NONREACTIVE

## 2015-01-08 LAB — GC/CHLAMYDIA PROBE AMP (~~LOC~~) NOT AT ARMC
CHLAMYDIA, DNA PROBE: POSITIVE — AB
NEISSERIA GONORRHEA: NEGATIVE

## 2015-01-09 ENCOUNTER — Telehealth (HOSPITAL_BASED_OUTPATIENT_CLINIC_OR_DEPARTMENT_OTHER): Payer: Self-pay | Admitting: Emergency Medicine

## 2015-08-03 ENCOUNTER — Emergency Department (HOSPITAL_COMMUNITY)
Admission: EM | Admit: 2015-08-03 | Discharge: 2015-08-04 | Disposition: A | Discharge status: Home / Self Care | Payer: Medicaid Other | Attending: Emergency Medicine | Admitting: Emergency Medicine

## 2015-08-03 ENCOUNTER — Encounter (HOSPITAL_COMMUNITY): Payer: Self-pay | Admitting: Emergency Medicine

## 2015-08-03 DIAGNOSIS — Y939 Activity, unspecified: Secondary | ICD-10-CM | POA: Insufficient documentation

## 2015-08-03 DIAGNOSIS — F329 Major depressive disorder, single episode, unspecified: Secondary | ICD-10-CM | POA: Diagnosis not present

## 2015-08-03 DIAGNOSIS — S8002XA Contusion of left knee, initial encounter: Secondary | ICD-10-CM

## 2015-08-03 DIAGNOSIS — S5011XA Contusion of right forearm, initial encounter: Secondary | ICD-10-CM | POA: Insufficient documentation

## 2015-08-03 DIAGNOSIS — Y999 Unspecified external cause status: Secondary | ICD-10-CM | POA: Diagnosis not present

## 2015-08-03 DIAGNOSIS — S5012XA Contusion of left forearm, initial encounter: Secondary | ICD-10-CM | POA: Diagnosis not present

## 2015-08-03 DIAGNOSIS — S060X1A Concussion with loss of consciousness of 30 minutes or less, initial encounter: Secondary | ICD-10-CM | POA: Diagnosis not present

## 2015-08-03 DIAGNOSIS — H9192 Unspecified hearing loss, left ear: Secondary | ICD-10-CM | POA: Insufficient documentation

## 2015-08-03 DIAGNOSIS — Y929 Unspecified place or not applicable: Secondary | ICD-10-CM | POA: Diagnosis not present

## 2015-08-03 DIAGNOSIS — M542 Cervicalgia: Secondary | ICD-10-CM | POA: Insufficient documentation

## 2015-08-03 DIAGNOSIS — W228XXA Striking against or struck by other objects, initial encounter: Secondary | ICD-10-CM | POA: Insufficient documentation

## 2015-08-03 DIAGNOSIS — Z87891 Personal history of nicotine dependence: Secondary | ICD-10-CM | POA: Insufficient documentation

## 2015-08-03 DIAGNOSIS — S0990XA Unspecified injury of head, initial encounter: Secondary | ICD-10-CM | POA: Diagnosis present
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
## 2015-08-04 ENCOUNTER — Emergency Department (HOSPITAL_COMMUNITY): Payer: Medicaid Other

## 2015-08-04 MED ORDER — NAPROXEN 500 MG PO TABS: 500.0000 mg | ORAL_TABLET | Freq: Two times a day (BID) | ORAL | Status: DC

## 2015-08-04 MED ORDER — NAPROXEN 250 MG PO TABS
500.0000 mg | ORAL_TABLET | Freq: Once | ORAL | Status: AC
  Administered 2015-08-04: 500 mg via ORAL
  Filled 2015-08-04: qty 2

## 2015-08-04 NOTE — Discharge Instructions (Signed)
Please read and follow all provided instructions.  Your diagnoses today include:  1. Concussion, with loss of consciousness of 30 minutes or less, initial encounter     Tests performed today include:  CT scan of your head that did not show any serious injury.  X-ray of your knee - no broken bones  Vital signs. See below for your results today.   Medications prescribed:   Naproxen - anti-inflammatory pain medication  Do not exceed 500mg  naproxen every 12 hours, take with food  You have been prescribed an anti-inflammatory medication or NSAID. Take with food. Take smallest effective dose for the shortest duration needed for your pain. Stop taking if you experience stomach pain or vomiting.   Take any prescribed medications only as directed.  Home care instructions:  Follow any educational materials contained in this packet.  BE VERY CAREFUL not to take multiple medicines containing Tylenol (also called acetaminophen). Doing so can lead to an overdose which can damage your liver and cause liver failure and possibly death.   Follow-up instructions: Please follow-up with your primary care provider in the next 3 days for further evaluation of your symptoms.   Return instructions:  SEEK IMMEDIATE MEDICAL ATTENTION IF:  There is confusion or drowsiness (although children frequently become drowsy after injury).   You cannot awaken the injured person.   You have more than one episode of vomiting.   You notice dizziness or unsteadiness which is getting worse, or inability to walk.   You have convulsions or unconsciousness.   You experience severe, persistent headaches not relieved by Tylenol.  You cannot use arms or legs normally.   There are changes in pupil sizes. (This is the black center in the colored part of the eye)   There is clear or bloody discharge from the nose or ears.   You have change in speech, vision, swallowing, or understanding.   Localized weakness,  numbness, tingling, or change in bowel or bladder control.  You have any other emergent concerns.  Additional Information: You have had a head injury which does not appear to require admission at this time.  Your vital signs today were: BP 117/80 mmHg   Pulse 69   Temp(Src) 97.6 F (36.4 C) (Oral)   Resp 14   Ht 4\' 11"  (1.499 m)   Wt 56.926 kg   BMI 25.33 kg/m2   SpO2 99%   LMP 06/30/2015 If your blood pressure (BP) was elevated above 135/85 this visit, please have this repeated by your doctor within one month. --------------

## 2015-08-04 NOTE — ED Notes (Signed)
Pt departed in NAD.  
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
## 2015-08-04 NOTE — ED Notes (Signed)
Patient transported to CT 

## 2015-09-17 ENCOUNTER — Emergency Department (HOSPITAL_COMMUNITY)
Admission: EM | Admit: 2015-09-17 | Discharge: 2015-09-17 | Disposition: A | Discharge status: Home / Self Care | Payer: Medicaid Other | Attending: Emergency Medicine | Admitting: Emergency Medicine

## 2015-09-17 ENCOUNTER — Encounter (HOSPITAL_COMMUNITY): Payer: Self-pay | Admitting: *Deleted

## 2015-09-17 ENCOUNTER — Inpatient Hospital Stay (EMERGENCY_DEPARTMENT_HOSPITAL)
Admission: AD | Admit: 2015-09-17 | Discharge: 2015-09-17 | Disposition: A | Discharge status: Home / Self Care | Payer: Medicaid Other | Source: Ambulatory Visit | Attending: Obstetrics and Gynecology | Admitting: Obstetrics and Gynecology

## 2015-09-17 DIAGNOSIS — N898 Other specified noninflammatory disorders of vagina: Secondary | ICD-10-CM | POA: Insufficient documentation

## 2015-09-17 DIAGNOSIS — Z3202 Encounter for pregnancy test, result negative: Secondary | ICD-10-CM | POA: Insufficient documentation

## 2015-09-17 DIAGNOSIS — N760 Acute vaginitis: Secondary | ICD-10-CM | POA: Diagnosis not present

## 2015-09-17 DIAGNOSIS — F319 Bipolar disorder, unspecified: Secondary | ICD-10-CM

## 2015-09-17 DIAGNOSIS — N390 Urinary tract infection, site not specified: Secondary | ICD-10-CM | POA: Diagnosis not present

## 2015-09-17 DIAGNOSIS — Z91040 Latex allergy status: Secondary | ICD-10-CM

## 2015-09-17 DIAGNOSIS — Z87891 Personal history of nicotine dependence: Secondary | ICD-10-CM

## 2015-09-17 DIAGNOSIS — B9689 Other specified bacterial agents as the cause of diseases classified elsewhere: Secondary | ICD-10-CM

## 2015-09-17 DIAGNOSIS — R1030 Lower abdominal pain, unspecified: Secondary | ICD-10-CM | POA: Diagnosis present

## 2015-09-17 HISTORY — DX: Gestational (pregnancy-induced) hypertension without significant proteinuria, unspecified trimester: O13.9

## 2015-09-17 LAB — URINE MICROSCOPIC-ADD ON

## 2015-09-17 LAB — URINALYSIS, ROUTINE W REFLEX MICROSCOPIC
BILIRUBIN URINE: NEGATIVE
BILIRUBIN URINE: NEGATIVE
Glucose, UA: NEGATIVE mg/dL
Glucose, UA: NEGATIVE mg/dL
KETONES UR: NEGATIVE mg/dL
Ketones, ur: NEGATIVE mg/dL
NITRITE: NEGATIVE
Nitrite: NEGATIVE
PROTEIN: NEGATIVE mg/dL
Protein, ur: NEGATIVE mg/dL
Specific Gravity, Urine: 1.015 (ref 1.005–1.030)
Specific Gravity, Urine: 1.016 (ref 1.005–1.030)
pH: 7 (ref 5.0–8.0)
pH: 6.5 (ref 5.0–8.0)

## 2015-09-17 LAB — COMPREHENSIVE METABOLIC PANEL
ALK PHOS: 59 U/L (ref 38–126)
ALT: 33 U/L (ref 14–54)
AST: 21 U/L (ref 15–41)
Albumin: 4.3 g/dL (ref 3.5–5.0)
Anion gap: 8 (ref 5–15)
BUN: 10 mg/dL (ref 6–20)
CALCIUM: 9.9 mg/dL (ref 8.9–10.3)
CO2: 27 mmol/L (ref 22–32)
Chloride: 103 mmol/L (ref 101–111)
Creatinine, Ser: 0.72 mg/dL (ref 0.44–1.00)
Glucose, Bld: 101 mg/dL — ABNORMAL HIGH (ref 65–99)
Potassium: 4 mmol/L (ref 3.5–5.1)
SODIUM: 138 mmol/L (ref 135–145)
Total Bilirubin: 0.8 mg/dL (ref 0.3–1.2)
Total Protein: 7.4 g/dL (ref 6.5–8.1)

## 2015-09-17 LAB — CBC
HCT: 41.5 % (ref 36.0–46.0)
Hemoglobin: 14.3 g/dL (ref 12.0–15.0)
MCH: 28.9 pg (ref 26.0–34.0)
MCHC: 34.5 g/dL (ref 30.0–36.0)
MCV: 84 fL (ref 78.0–100.0)
PLATELETS: 262 10*3/uL (ref 150–400)
RBC: 4.94 MIL/uL (ref 3.87–5.11)
RDW: 13.4 % (ref 11.5–15.5)
WBC: 9.8 10*3/uL (ref 4.0–10.5)

## 2015-09-17 LAB — WET PREP, GENITAL
Sperm: NONE SEEN
Trich, Wet Prep: NONE SEEN
Yeast Wet Prep HPF POC: NONE SEEN

## 2015-09-17 LAB — I-STAT BETA HCG BLOOD, ED (MC, WL, AP ONLY)

## 2015-09-17 LAB — POCT PREGNANCY, URINE: PREG TEST UR: NEGATIVE

## 2015-09-17 MED ORDER — FLUCONAZOLE 150 MG PO TABS: ORAL_TABLET | ORAL | 0 refills | Status: DC

## 2015-09-17 MED ORDER — SULFAMETHOXAZOLE-TRIMETHOPRIM 800-160 MG PO TABS: 1.0000 | ORAL_TABLET | Freq: Two times a day (BID) | ORAL | 0 refills | Status: DC

## 2015-09-17 MED ORDER — METRONIDAZOLE 500 MG PO TABS: 500.0000 mg | ORAL_TABLET | Freq: Two times a day (BID) | ORAL | 0 refills | Status: DC

## 2015-09-17 NOTE — Discharge Instructions (Signed)
Jamie Hansen,  Por favor tome el antibitico flagyl dos veces al da durante una semana. Si usted desarrolla la secrecin de queso y picazn, tome diflucan para la levadura. Contine con el motrin y el ibuprofeno segn sea necesario para calambres. Recibir una llamada si cualquiera de sus otras infecciones de transmisin sexual resultan positivas.  Ms. Jamie Hansen,  Please take the antibiotic flagyl twice daily for a week. If you develop cheesy discharge and itching, take diflucan for yeast. Continue motrin and ibuprofen as needed for cramping. You will get a call if any of your other sexually transmitted infections result as positive.

## 2015-09-17 NOTE — ED Notes (Signed)
This RN and Sherron Monday MD reviewed d/c instructions with patient via the interpreter phone and pt has no further questions. Pt stable and NAD.

## 2015-09-17 NOTE — ED Triage Notes (Signed)
Patient presents with c/o lower abd and bloating also c/o vaginal discharge

## 2015-09-17 NOTE — Discharge Instructions (Signed)

## 2015-09-17 NOTE — MAU Provider Note (Signed)
History     CSN: [id number]  Arrival date and time: 09/17/15 1818   First Provider Initiated Contact with Patient 09/17/15 1937      Chief Complaint  Patient presents with  . Vaginal Bleeding  . Abdominal Pain   HPI   Spanish interpretor used   [name] is a 20 y.o. female G1P1001 here with concerns of fatigue, increased urination, and lower abdominal pain and lower back pain. She had mirena IUD removed one year ago and has been having heavy menstrual/ painful periods.   She started spotting today and passed one clot. This is the time her normal menstrual cycle would start.   OB History    Gravida Para Term Preterm AB Living   1 1 1     1    SAB TAB Ectopic Multiple Live Births           1      Past Medical History:  Diagnosis Date  . Bipolar 1 disorder (HCC)   . Chlamydia   . Depression   . Pregnancy induced hypertension     Past Surgical History:  Procedure Laterality Date  . CESAREAN SECTION    . HERNIA REPAIR      Family History  Problem Relation Age of Onset  . Cancer Maternal Grandmother     Social History  Substance Use Topics  . Smoking status: Former Games developer  . Smokeless tobacco: Never Used  . Alcohol use No    Allergies:  Allergies  Allergen Reactions  . Latex Itching and Other (See Comments)    Itching and yeast like symptoms with latex condom use.    Facility-Administered Medications Prior to Admission  Medication Dose Route Frequency Provider Last Rate Last Dose  . levonorgestrel (MIRENA) 20 MCG/24HR IUD   Intrauterine Once Dara Lords, MD       Prescriptions Prior to Admission  Medication Sig Dispense Refill Last Dose  . doxycycline (VIBRAMYCIN) 100 MG capsule Take 1 capsule (100 mg total) by mouth 2 (two) times daily. 20 capsule 0   . metroNIDAZOLE (FLAGYL) 500 MG tablet Take 1 tablet (500 mg total) by mouth 2 (two) times daily. 14 tablet 0   . naproxen (NAPROSYN) 500 MG tablet Take 1 tablet (500 mg total) by  mouth 2 (two) times daily. 20 tablet 0    Results for orders placed or performed during the hospital encounter of 09/17/15 (from the past 48 hour(s))  Urinalysis, Routine w reflex microscopic (not at [facility])     Status: Abnormal   Collection Time: 09/17/15  6:45 PM  Result Value Ref Range   Color, Urine YELLOW YELLOW   APPearance CLEAR CLEAR   Specific Gravity, Urine 1.015 1.005 - 1.030   pH 7.0 5.0 - 8.0   Glucose, UA NEGATIVE NEGATIVE mg/dL   Hgb urine dipstick LARGE (A) NEGATIVE   Bilirubin Urine NEGATIVE NEGATIVE   Ketones, ur NEGATIVE NEGATIVE mg/dL   Protein, ur NEGATIVE NEGATIVE mg/dL   Nitrite NEGATIVE NEGATIVE   Leukocytes, UA TRACE (A) NEGATIVE  Urine microscopic-add on     Status: Abnormal   Collection Time: 09/17/15  6:45 PM  Result Value Ref Range   Squamous Epithelial / LPF 0-5 (A) NONE SEEN   WBC, UA 0-5 0 - 5 WBC/hpf   RBC / HPF TOO NUMEROUS TO COUNT 0 - 5 RBC/hpf   Bacteria, UA FEW (A) NONE SEEN  Pregnancy, urine POC     Status: None   Collection Time: 09/17/15  7:12 PM  Result Value Ref Range   Preg Test, Ur NEGATIVE NEGATIVE    Comment:        THE SENSITIVITY OF THIS METHODOLOGY IS >24 mIU/mL     Review of Systems  Genitourinary: Negative for dysuria.  Neurological: Negative for dizziness.   Physical Exam   Blood pressure 139/91, pulse 78, temperature 98.5 F (36.9 C), temperature source Oral, resp. rate 16, height 5' (1.524 m), weight 123 lb (55.8 kg), last menstrual period 08/12/2015.  Physical Exam  Constitutional: She is oriented to person, place, and time. She appears well-developed and well-nourished. No distress.  HENT:  Head: Normocephalic.  Eyes: Pupils are equal, round, and reactive to light.  Respiratory: Effort normal.  GI: Soft. Normal appearance. She exhibits no distension and no mass. There is tenderness in the suprapubic area. There is no rebound and no guarding.  Musculoskeletal: Normal range of motion.  Neurological: She is alert  and oriented to person, place, and time.  Skin: Skin is warm. She is not diaphoretic.  Psychiatric: Her behavior is normal.    MAU Course  Procedures  None  MDM  Patient declined STD testing  UA   Assessment and Plan   A:  1. UTI (lower urinary tract infection)   2. Encounter for pregnancy test with result negative     P:  Discharge home stable condition Rx: Bactrim Follow up with PCP Return to MAU for emergencies only   Duane Lope, NP 09/18/2015 8:05 AM

## 2015-09-17 NOTE — MAU Note (Signed)
No period in July. This morning she woke up with back pain and cramping.  When she went to the bathroom, she passed a big red clot, since then she has been bleeding.  Had not done a preg test

## 2015-09-17 NOTE — ED Provider Notes (Signed)
[facility]-EMERGENCY DEPT Provider Note   CSN: [id number] Arrival date & time: 09/17/15  2029  First Provider Contact:  First MD Initiated Contact with Patient 09/17/15 2203      History   Chief Complaint Chief Complaint  Patient presents with  . Abdominal Pain    HPI Jamie Hansen is a 20 y.o. female. She presents for 2 weeks of vaginal discharge and 3 days of lower abdominal pain. She was seen at [facility] earlier today and had a negative pregnancy test and negative beta hcg. She was given bactrim for possible UTI but had not filled prescription. She denied n/v/d. She is sexually active with 1 female partner with whom she has been with for 2 years. She is uncertain if he has had other partners. She was treated for chlamydia last year. They do not use condoms, and she is not on birth control despite her not wanting to have another child until "the future." No fevers. She says she passed a blood clot this morning and was concerned she could have been pregnant. She is having abdominal cramping similar to her normal menstrual cramps. She has had improvement in abdominal pain with motrin.   Visit conducted with assistance of [employer] [phone]).   HPI  Past Medical History:  Diagnosis Date  . Bipolar 1 disorder (HCC)   . Chlamydia   . Depression   . Pregnancy induced hypertension     Patient Active Problem List   Diagnosis Date Noted  . Bipolar disorder, unspecified (HCC) 11/14/2012  . Anxiety 11/14/2012  . History of chlamydia infection 11/14/2012    Past Surgical History:  Procedure Laterality Date  . CESAREAN SECTION    . HERNIA REPAIR      OB History    Gravida Para Term Preterm AB Living   1 1 1     1    SAB TAB Ectopic Multiple Live Births           1       Home Medications    Prior to Admission medications   Medication Sig Start Date End Date Taking? Authorizing Provider  fluconazole (DIFLUCAN) 150 MG tablet Take 1 pill if you  develop itching. May repeat in 72 hours if symptoms do not improve. 09/17/15   Jatoria Kneeland Percell Boston, MD  metroNIDAZOLE (FLAGYL) 500 MG tablet Take 1 tablet (500 mg total) by mouth 2 (two) times daily. 09/17/15   Maia Handa Percell Boston, MD  naproxen (NAPROSYN) 500 MG tablet Take 1 tablet (500 mg total) by mouth 2 (two) times daily. 08/04/15   Renne Crigler, PA-C    Family History Family History  Problem Relation Age of Onset  . Cancer Maternal Grandmother     Social History Social History  Substance Use Topics  . Smoking status: Former Games developer  . Smokeless tobacco: Never Used  . Alcohol use No     Allergies   Latex   Review of Systems Review of Systems  Constitutional: Negative for activity change, chills and fever.  HENT: Negative for congestion, rhinorrhea and sore throat.   Eyes: Negative for pain and visual disturbance.  Respiratory: Negative for cough and shortness of breath.   Cardiovascular: Negative for chest pain and palpitations.  Gastrointestinal: Negative for abdominal pain, diarrhea, nausea and vomiting.  Genitourinary: Positive for frequency, vaginal bleeding and vaginal discharge. Negative for dyspareunia, dysuria and urgency.  Musculoskeletal: Positive for back pain (cramping). Negative for joint swelling.  Skin: Negative for rash and wound.  Physical Exam Updated Vital Signs BP 125/64   Pulse 80   Temp 99.2 F (37.3 C) (Oral)   Resp 18   Ht 4\' 11"  (1.499 m)   Wt 54.6 kg   LMP 09/17/2015 (Exact Date)   SpO2 100%   BMI 24.30 kg/m   Physical Exam  Constitutional: She is oriented to person, place, and time. She appears well-developed and well-nourished. No distress.  HENT:  Head: Normocephalic and atraumatic.  Mouth/Throat: Oropharynx is clear and moist.  Eyes: Conjunctivae and EOM are normal. Pupils are equal, round, and reactive to light.  Neck: Normal range of motion. Neck supple.  Cardiovascular: Normal rate, regular rhythm, normal heart  sounds and intact distal pulses.   No murmur heard. Pulmonary/Chest: Effort normal and breath sounds normal. No respiratory distress. She has no wheezes.  Abdominal: Soft. Bowel sounds are normal. She exhibits no distension and no mass. There is tenderness (Mild LLQ tenderness). There is no rebound and no guarding.  Genitourinary:  Genitourinary Comments: Vaginal bleeding noted on speculum exam. No thick discharge once blood wiped away. No cervical motion tenderness on bimanual exam. No rashes or lesions noted.  Musculoskeletal: Normal range of motion. She exhibits no edema.  Neurological: She is alert and oriented to person, place, and time.  Skin: Skin is warm and dry.  Psychiatric: She has a normal mood and affect.  Nursing note and vitals reviewed.  ED Treatments / Results  Labs (all labs ordered are listed, but only abnormal results are displayed) Labs Reviewed  WET PREP, GENITAL - Abnormal; Notable for the following:       Result Value   Clue Cells Wet Prep HPF POC PRESENT (*)    WBC, Wet Prep HPF POC MANY (*)    All other components within normal limits  COMPREHENSIVE METABOLIC PANEL - Abnormal; Notable for the following:    Glucose, Bld 101 (*)    All other components within normal limits  URINALYSIS, ROUTINE W REFLEX MICROSCOPIC (NOT AT [facility]) - Abnormal; Notable for the following:    APPearance CLOUDY (*)    Hgb urine dipstick LARGE (*)    Leukocytes, UA SMALL (*)    All other components within normal limits  URINE MICROSCOPIC-ADD ON - Abnormal; Notable for the following:    Squamous Epithelial / LPF 0-5 (*)    Bacteria, UA RARE (*)    All other components within normal limits  CBC  RPR  HIV ANTIBODY (ROUTINE TESTING)  I-STAT BETA HCG BLOOD, ED ([facility], [facility], [facility] ONLY)  GC/CHLAMYDIA PROBE AMP () NOT AT [facility]    EKG  EKG Interpretation None       Radiology No results found.  Procedures Procedures (including critical care time)  Medications Ordered in  ED Medications - No data to display   Initial Impression / Assessment and Plan / ED Course  I have reviewed the triage vital signs and the nursing notes.  Pertinent labs & imaging results that were available during my care of the patient were reviewed by me and considered in my medical decision making (see chart for details).  Clinical Course   Pt presents with abdominal pain and increased vaginal discharge. Wet prep was positive for clue cells. No cervical motion tenderness on exam and LLQ pain was not consistently present, so no imaging was performed. HIV, RPR, GC/chlamydia obtained and pending. Low suspicion for PID given lack of cervical motion tenderness. UA from earlier in the day showed small leukocytes and blood but  add-on showed squamous cells, and patient appears to be on her menstrual cycle. Recommended treatment with flagyl. Provided prescription for diflucan in case pt develops yeast infection. Recommended not taking bactrim prescribed earlier for possible UTI, as labs more consistent with bacterial vaginosis.   Final Clinical Impressions(s) / ED Diagnoses   Final diagnoses:  BV (bacterial vaginosis)    New Prescriptions New Prescriptions   FLUCONAZOLE (DIFLUCAN) 150 MG TABLET    Take 1 pill if you develop itching. May repeat in 72 hours if symptoms do not improve.   METRONIDAZOLE (FLAGYL) 500 MG TABLET    Take 1 tablet (500 mg total) by mouth 2 (two) times daily.     Casey Burkitt, MD 09/18/15 [id number]    Zadie Rhine, MD 09/18/15 1640

## 2015-09-17 NOTE — ED Notes (Signed)
MD at bedside. 

## 2015-09-18 LAB — GC/CHLAMYDIA PROBE AMP (~~LOC~~) NOT AT ARMC
CHLAMYDIA, DNA PROBE: NEGATIVE
NEISSERIA GONORRHEA: NEGATIVE

## 2015-09-18 LAB — RPR: RPR: NONREACTIVE

## 2015-09-18 LAB — HIV ANTIBODY (ROUTINE TESTING W REFLEX): HIV SCREEN 4TH GENERATION: NONREACTIVE

## 2015-09-22 ENCOUNTER — Encounter: Payer: Self-pay | Admitting: Gynecology

## 2015-09-23 ENCOUNTER — Telehealth (HOSPITAL_BASED_OUTPATIENT_CLINIC_OR_DEPARTMENT_OTHER): Payer: Self-pay | Admitting: Emergency Medicine

## 2015-10-09 ENCOUNTER — Encounter: Payer: Self-pay | Admitting: Gynecology

## 2015-10-09 DIAGNOSIS — Z0289 Encounter for other administrative examinations: Secondary | ICD-10-CM

## 2015-10-13 ENCOUNTER — Ambulatory Visit (INDEPENDENT_AMBULATORY_CARE_PROVIDER_SITE_OTHER): Payer: Medicaid Other | Admitting: Gynecology

## 2015-10-13 ENCOUNTER — Encounter: Payer: Self-pay | Admitting: Gynecology

## 2015-10-13 VITALS — BP 124/78

## 2015-10-13 DIAGNOSIS — R350 Frequency of micturition: Secondary | ICD-10-CM | POA: Diagnosis not present

## 2015-10-13 DIAGNOSIS — Z8619 Personal history of other infectious and parasitic diseases: Secondary | ICD-10-CM | POA: Diagnosis not present

## 2015-10-13 DIAGNOSIS — N3001 Acute cystitis with hematuria: Secondary | ICD-10-CM | POA: Diagnosis not present

## 2015-10-13 DIAGNOSIS — N979 Female infertility, unspecified: Secondary | ICD-10-CM | POA: Diagnosis not present

## 2015-10-13 LAB — URINALYSIS W MICROSCOPIC + REFLEX CULTURE
BILIRUBIN URINE: NEGATIVE
CASTS: NONE SEEN [LPF]
Crystals: NONE SEEN [HPF]
Glucose, UA: NEGATIVE
KETONES UR: NEGATIVE
NITRITE: NEGATIVE
Protein, ur: NEGATIVE
SPECIFIC GRAVITY, URINE: 1.015 (ref 1.001–1.035)
WBC, UA: >60 WBC/HPF — AB (ref <=5)
Yeast: NONE SEEN [HPF]
pH: 5 (ref 5.0–8.0)

## 2015-10-13 MED ORDER — PHENAZOPYRIDINE HCL 200 MG PO TABS: 200.0000 mg | ORAL_TABLET | Freq: Three times a day (TID) | ORAL | 0 refills | Status: DC | PRN

## 2015-10-13 MED ORDER — FLUCONAZOLE 150 MG PO TABS: 150.0000 mg | ORAL_TABLET | Freq: Once | ORAL | 0 refills | Status: AC

## 2015-10-13 MED ORDER — NITROFURANTOIN MONOHYD MACRO 100 MG PO CAPS: 100.0000 mg | ORAL_CAPSULE | Freq: Two times a day (BID) | ORAL | 0 refills | Status: DC

## 2015-10-13 NOTE — Progress Notes (Signed)
   Patient is a 20 year old who presented to the office today complaining over the past 2 days of urinary frequency and the inability to empty completely. Also some slight suprapubic discomfort. Patient denied any vaginal discharge. Patient had any back pain, fever, chills, nausea, or vomiting. Patient also stating that for the past years that she had the IUD removed she has not been able to get pregnant but has not really been trying. She did have chlamydia infection several years ago. Her partner has had 2 children with a prior partner. He is in his 57s.  Exam: Abdomen: Soft nontender no rebound or guarding Back: No CVA tenderness Pelvic: Bartholin urethra Skene was within normal limits Vagina: No lesions or discharge Cervix: No lesions or discharge Uterus: Anteverted normal size shape and consistency Adnexa: No palpable mass or tenderness Rectal exam: Not done  Urinalysis: Moderate bacteria, greater than 60 WBC, greater than 60 RBC  Assessment/plan: #1 clinical evidence of urinary tract infection will be treated with Macrobid one by mouth twice a day for 7 days with the addition of Pyridium 300 mg 3 times a day for 3 days. #2 because of patient's sensitivity to antibiotics and developing yeast infection a prescription of Diflucan 150 mg will be provided #3 patient with primary infertility normal menstrual cycle was given instructions on timing of intercourse with utilization ovulation predictor kit and of unsuccessful in 3 months we will schedule an HSG as well as a semen analysis. Literature information on infertility was provided.  Time of consultation today was 15 minutes

## 2015-10-13 NOTE — Patient Instructions (Addendum)
Infertilidad (Infertility) La infertilidad es no poder Research scientist (physical sciences)lograr el embarazo (concebir) despus de un ao de Pharmacologistmantener relaciones sexuales regularmente sin usar ningn mtodo de control de la natalidad. La infertilidad tambin puede significar que una mujer no puede llevar el embarazo a trmino.  Tanto hombres como mujeres pueden tener problemas de fertilidad. CULES SON LAS CAUSAS DE LA INFERTILIDAD? Cules son las causas de la infertilidad en las mujeres? Hay muchas causas posibles de infertilidad en las mujeres. En algunas mujeres, no hay ninguna causa aparente de infertilidad (infertilidad idioptica). La infertilidad tambin puede estar vinculada a ms de [place]una causa. Los problemas de infertilidad en las mujeres pueden deberse a problemas en el ciclo menstrual o los rganos reproductivos, ciertas afecciones mdicas y factores relacionados con la edad y el estilo de vida.  Los problemas en el ciclo menstrual pueden interferir con los ovarios que producen los vulos (ovulacin). Esto puede causar dificultad para quedar embarazada e incluye tener un ciclo menstrual muy largo, muy corto o irregular.  Los problemas relacionados con los rganos reproductivos incluyen lo siguiente:  Un cuello de tero anormalmente angosto o que no permanece cerrado durante el Senecaembarazo.  Una obstruccin en las trompas de [place].  Un tero de forma anormal.  Fibromas uterinos. Estos son masas de tejido (tumores) que pueden Solicitordesarrollarse en el tero.  Las afecciones mdicas que pueden afectar la fertilidad en las mujeres incluyen lo siguiente:  Sndrome de ovario poliqustico (SOP). Este es un trastorno hormonal que produce pequeos quistes en los ovarios y es la causa ms frecuente de infertilidad en las mujeres.  Endometriosis. Es una enfermedad en la que el tejido que recubre el tero (endometrio) crece fuera de su ubicacin normal.  Insuficiencia ovrica primaria. Esto es cuando los ovarios dejan de producir  vulos y hormonas antes de los 40aos de [place].  Enfermedades de transmisin sexual (ETS), como la clamidia o la gonorrea. Estas infecciones pueden provocar fibrosis en las trompas de [place], lo que disminuye la probabilidad de que los vulos lleguen al tero.  Trastornos autoinmunitarios. Estas son afecciones en las que el sistema inmunitario ataca las clulas normales y saludables.  Desequilibrios hormonales.  Otros factores incluyen los siguientes:  La edad. La fertilidad en las mujeres declina con la edad, especialmente despus de los 35aos.  Tener bajo peso o sobrepeso.  Beber alcohol en exceso.  Consumir drogas.  Hacer ejercicio en exceso.  La exposicin a toxinas ambientales, como la radiacin, los plaguicidas y ciertos qumicos. Cules son las causas de la infertilidad en los hombres? Hay muchas causas de infertilidad en los hombres. La infertilidad puede estar vinculada a ms de Dean Foods Companyuna causa. Los problemas de infertilidad en los hombres pueden deberse a problemas con los espermatozoides o los rganos reproductivos, ciertas afecciones mdicas y factores relacionados con la edad y el estilo de vida. Algunos hombres tienen infertilidad idioptica.   Problemas con los espermatozoides. La infertilidad puede ser consecuencia de un problema para producir lo siguiente:  Suficientes espermatozoides (bajo recuento de espermatozoides).  Suficientes espermatozoides con forma normal (morfologa de los espermatozoides).  Espermatozoides que puedan llegar al vulo (motilidad deficiente).  Las causas de la infertilidad tambin incluyen las siguientes:  Un problema con las hormonas.  Venas agrandadas (varicocele), quistes (espermatocele) o tumores en los testculos.  Disfuncin sexual.  Neomia DearUna lesin en los testculos.  Un defecto de nacimiento, como no tener los conductos que transportan los espermatozoides (conductos deferentes).  Algunas de las afecciones mdicas que pueden  afectar la fertilidad Teachers Insurance and Annuity Associationen los  hombres son las siguientes:  Diabetes.  Tratamiento Water quality scientist, como la radioterapia o la quimioterapia.  El sndrome de Klinefelter. Este es un trastorno gentico hereditario.  Problemas de tiroides, como una tiroides hipoactiva o hiperactiva.  Fibrosis qustica.  Enfermedades de transmisin sexual.  Otros factores incluyen los siguientes:  La edad. La fertilidad en los hombres declina con la edad.  Beber alcohol en exceso.  Consumir drogas.  La exposicin a toxinas ambientales, como la radiacin, los plaguicidas y el plomo. CULES SON LOS SNTOMAS DE LA INFERTILIDAD? El nico signo de infertilidad es no poder Research scientist (physical sciences) despus de un ao de Pharmacologist relaciones sexuales regularmente sin usar ningn mtodo de control de la natalidad.  CMO SE DIAGNOSTICA LA INFERTILIDAD? Para recibir un diagnstico de infertilidad, ambos integrantes de la pareja se harn a un examen fsico. Tambin se analizar en detalle la historia clnica y sexual de ambos. Si no hay una razn evidente de infertilidad, se harn estudios adicionales. Qu estudios se harn las mujeres? Las mujeres primero pueden hacerse estudios para controlar si ovulan todos los meses. Estos estudios pueden incluir lo siguiente:  Anlisis de sangre para [employer] niveles hormonales.  Una ecografa de los ovarios. Este estudio detecta posibles problemas en los ovarios.  Toma de [place] de tejido que recubre el tero para examinarla en el microscopio (biopsia de endometrio). Las mujeres que ovulan pueden realizarse estudios adicionales. Estos pueden incluir los siguientes:  Histerosalpingografa.  Es una radiografa de las trompas de [place] y el tero despus de Tourist information centre manager un tipo especfico de sustancia de [place].  Esta prueba puede mostrar la forma del tero y si las trompas de [place] estn abiertas.  Laparoscopia.  En [employer], se [place] un tubo que  emite luz (laparoscopio) para Stage manager en las trompas de [place]io y otros rganos femeninos.  Ecografa transvaginal.  Este es un estudio de diagnstico por imgenes que determina si hay anomalas en el tero y los ovarios.  El mdico puede utilizar este estudio para contar la cantidad de [employer] ovarios.  Histeroscopa.  En [employer], se [place] un tubo que emite luz para examinar el cuello y el interior del tero.  Se realiza para Psychologist, sport and exercise en el interior del tero. Qu estudios se harn los hombres? Los estudios para Production assistant, radio infertilidad en los hombres incluyen los siguientes:  Anlisis de semen para controlar el recuento, la morfologa y la motilidad de los espermatozoides.  Anlisis de sangre para [employer].  Toma de [place] de tejido del interior de un testculo (biopsia). La muestra se examina en el microscopio.  Anlisis de sangre para Psychologist, sport and exercise genticas (pruebas genticas). CUL ES EL TRATAMIENTO PARA LA INFERTILIDAD EN LAS MUJERES?  El tratamiento depende de la causa de la infertilidad. En la [employer], la infertilidad en las mujeres se trata con medicamentos o [place].  Las mujeres pueden tomar medicamentos para:  Clinical cytogeneticist de ovulacin.  Tratar otras enfermedades, como el [place].  Se puede realizar [place] para lo siguiente:  Reparar daos en los ovarios, las trompas de [place], [address] cuello del tero o Careers information officer.  Extraer tumores del tero.  Extraer tejido cicatricial del tero, la pelvis u otros rganos femeninos. CUL ES EL TRATAMIENTO PARA LA INFERTILIDAD EN LOS HOMBRES?  El tratamiento depende de la causa de la infertilidad. En la [employer], la infertilidad en los hombres se trata con medicamentos o [place].  Los hombres pueden tomar medicamentos para:  Regulatory affairs officer.  Tratar otras enfermedades.  Tratar la disfuncin  sexual.  Se puede realizar [place] para lo siguiente:  Eliminar obstrucciones en el tracto reproductivo.  Corregir otros problemas estructurales del tracto reproductivo. QU SON LAS TCNICAS DE REPRODUCCIN ASISTIDA? Las tcnicas de reproduccin asistida (TRA) se refieren a todos los tratamientos y procedimientos que unen vulos y espermatozoides fuera del cuerpo para ayudar a una pareja a Office manager. Las TRA se suelen Teacher, English as a foreign language con medicamentos para la fertilidad que estimulan la ovulacin. En algunos casos, las TRA se llevan a cabo con vulos extrados del cuerpo de otra mujer (vulos de donante) o con vulos previamente fertilizados y congelados (embriones).  Hay diferentes tipos de TRA. Estos incluyen los siguientes:   Inseminacin intrauterina (IIU).  En este procedimiento, los espermatozoides se colocan directamente en el tero de la mujer con un tubo largo y delgado.  Esta tcnica puede ser la ms efectiva para la infertilidad causada por problemas con los espermatozoides, incluidos el bajo recuento y la baja motilidad.  Se puede utilizar en combinacin con medicamentos para la fertilidad.  Fertilizacin in vitro (FIV).  Por lo general, se [place] esta tcnica cuando las trompas de [place] de la mujer estn obstruidas o si el hombre tiene un bajo recuento de espermatozoides.  Los medicamentos para la infertilidad estimulan los ovarios para que produzcan varios vulos. Cuando estn maduros, estos vulos se extraen del cuerpo y se unen a los espermatozoides para ser fertilizados.  Luego los vulos fertilizados se colocan en el tero de la [place].   Esta informacin no tiene Theme park manager el consejo del mdico. Asegrese de hacerle al mdico cualquier pregunta que tenga.   Document Released: 02/28/2007 Document Revised: 03/01/2014 Elsevier Interactive Patient Education 2016 [employer]. Phenazopyridine tablets Qu es este medicamento? La FENAZOPIRIDINA es un analgsico.  Sunday Spillers para Engineer, materials, ardor o molestia causada por una infeccin o irritacin del tracto urinario. Este medicamento no es un antibitico. No curar una infeccin del tracto urinario. Este medicamento puede ser utilizado para otros usos; si tiene alguna pregunta consulte con su proveedor de atencin mdica o con su farmacutico. Qu le debo informar a mi profesional de la salud antes de tomar este medicamento? Necesita saber si usted presenta alguno de los siguientes problemas o situaciones: -deficiencia de glucosa-6-fosfato deshidrogenasa (L-6PD) -enfermedad renal -una reaccin alrgica o inusual a la fenazopiridina, a otros medicamentos, alimentos, colorantes o conservantes -si est embarazada o buscando quedar embarazada -si est amamantando a un beb Cmo debo utilizar este medicamento? Tome este medicamento por va oral con un vaso de agua. Siga las instrucciones de la etiqueta del [place]. Tmelo despus de las comidas. Tome sus dosis a intervalos regulares. No tome su medicamento con una frecuencia mayor que la indicada. No omita ninguna dosis o suspenda el uso de su medicamento antes de lo indicado aun si se siente mejor. No deje de tomarlo excepto si as lo indica su mdico. Hable con su pediatra para informarse acerca del uso de este medicamento en nios. Puede requerir atencin especial. Sobredosis: Pngase en contacto inmediatamente con un centro toxicolgico o una sala de urgencia si usted cree que haya tomado demasiado medicamento. ATENCIN: [employer] es solo para usted. No comparta este medicamento con nadie. Qu sucede si me olvido de una dosis? Si olvida una dosis, tmela lo antes posible. Si es casi la hora de la prxima dosis, tome slo esa dosis. No tome dosis adicionales  o dobles. Qu puede interactuar con este medicamento? No se esperan interacciones. Puede ser que esta lista no menciona todas las posibles interacciones. Informe a su profesional de DIRECTVla  salud de Ingram Micro Inctodos los productos a base de hierbas, medicamentos de Covedaleventa libre o suplementos nutritivos que est tomando. Si usted fuma, consume bebidas alcohlicas o si utiliza drogas ilegales, indqueselo tambin a su profesional de Beazer Homesla salud. Algunas sustancias pueden interactuar con su medicamento. A qu debo estar atento al usar PPL Corporationeste medicamento? Si los sntomas no mejoran o si empeoran, consulte con su mdico o con su profesional de Beazer Homesla salud. Este medicamento produce un color rojo en los lquidos corporales. Este efecto es inofensivo y Geneticist, moleculardesaparecer despus de que deje de tomar este medicamento. Este medicamento cambiar el color de su orina a un color naranja oscura o rojo. El color rojo puede manchar la ropa. Los lentes de contacto se pueden Soil scientistmanchar permanentemente. Es preferible no usar lentes de contacto blandos mientras est tomando este medicamento Si es diabtico podr Baristaobtener un resultado positivo falso en los anlisis de determinacin del nivel de International aid/development workerazcar en la orina. Consulte a su proveedor de Psychologist, prison and probation servicesatencin mdica. Qu efectos secundarios puedo tener al Boston Scientificutilizar este medicamento? Efectos secundarios que debe informar a su mdico o a Producer, television/film/videosu profesional de la salud tan pronto como sea posible: -Therapist, artreacciones alrgicas como erupcin cutnea, picazn o urticarias, hinchazn de la cara, labios o lengua -color azul o morado de la piel -dificultad al respirar -fiebre -orinar menos -sangrado, magulladuras inusuales -cansancio o debilidad inusual -vmitos -color amarillento de los ojos o la piel Efectos secundarios que, por lo general, no requieren Psychologist, prison and probation servicesatencin mdica (debe informarlos a su mdico o a su profesional de la salud si persisten o si son molestos): -orina de color amarillo oscuro -dolor de cabeza -Programme researcher, broadcasting/film/videomalestar estomacal Puede ser que esta lista no menciona todos los posibles efectos secundarios. Comunquese a su mdico por asesoramiento mdico Hewlett-Packardsobre los efectos secundarios. Usted puede informar los  efectos secundarios a la FDA por telfono al 1-800-FDA-1088. Dnde debo guardar mi medicina? Mantngala fuera del alcance de los nios. Gurdela a Sanmina-SCItemperatura ambiente, entre 15 y 30 grados C (2159 y 4386 grados F). Protjala de la luz y de la humedad. Deseche todo el medicamento que no haya utilizado, despus de la fecha de vencimiento. ATENCIN: Este folleto es un resumen. Puede ser que no cubra toda la posible informacin. Si usted tiene preguntas acerca de esta medicina, consulte con su mdico, su farmacutico o su profesional de Radiographer, therapeuticla salud.    2016, Elsevier/Gold Standard. (2014-04-02 00:00:00) Nitrofurantoin tablets or capsules Qu es este medicamento? La NITROFURANTONA es un antibitico. Se utiliza en el tratamiento de la infecciones del tracto urinario. Este medicamento puede ser utilizado para otros usos; si tiene alguna pregunta consulte con su proveedor de atencin mdica o con su farmacutico. Qu le debo informar a mi profesional de la salud antes de tomar este medicamento? Necesita saber si usted presenta alguno de los Coventry Health Caresiguientes problemas o situaciones: -anemia -diabetes -deficiencia de glucosa-6-fosfato deshidrogenasa -enfermedad renal -enfermedad heptica -enfermedad pulmonar -otras enfermedades crnicas -una reaccin alrgica o inusual a la nitrofurantona, a otros antibiticos, a otros medicamentos, alimentos, colorantes o conservantes -si est embarazada o buscando quedar embarazada -si est amamantando a un beb Cmo debo utilizar este medicamento? Tome este medicamento por va oral con un vaso de agua. Siga las instrucciones de la etiqueta del Edenbornmedicamento. Tome este medicamento con leche o con alimentos. Tome sus dosis a intervalos regulares. No tome su medicamento  con Neomia Dear frecuencia mayor que la indicada. No deje de tomarlo excepto si as lo indica su mdico. Hable con su pediatra para informarse acerca del uso de este medicamento en nios. Aunque este medicamento se  puede recetar para condiciones selectivas, las precauciones se aplican. Sobredosis: Pngase en contacto inmediatamente con un centro toxicolgico o una sala de urgencia si usted cree que haya tomado demasiado medicamento. ATENCIN: [employer] es solo para usted. No comparta este medicamento con nadie. Qu sucede si me olvido de una dosis? Si olvida una dosis, tmela lo antes posible. Si es casi la hora de la prxima dosis, tome slo esa dosis. No tome dosis adicionales o dobles. Qu puede interactuar con este medicamento? -anticidos que contienen trisilicato de magnesio -probenecid -antibiticos quinolnicos, tales como ciprofloxacina, lomefloxacino, norfloxacino y ofloxacino -sulfapirazona Puede ser que esta lista no menciona todas las posibles interacciones. Informe a su profesional de [employer] Homes de [employer] Inc productos a base de hierbas, medicamentos de [place] o suplementos nutritivos que est tomando. Si usted fuma, consume bebidas alcohlicas o si utiliza drogas ilegales, indqueselo tambin a su profesional de [employer]. Algunas sustancias pueden interactuar con su medicamento. A qu debo estar atento al usar PPL Corporation? Si los sntomas no mejoran o si experimenta nuevos sntomas, consulte con su mdico o su profesional de [employer]. Beba varios vasos de Warehouse manager. Si toma este medicamento durante un perodo de [employer], debe visitar a su mdico para chequear su evolucin peridicamente. Si es diabtico, podr Barista un resultado positivo falso en los [employer] de determinacin del nivel de azcar en la orina con algunas marcas de [place] de [place]. Consulte con su mdico. Qu efectos secundarios puedo tener al [employer] este medicamento? Efectos secundarios que debe informar a su mdico o a Producer, television/film/video de la salud tan pronto como sea posible: -Therapist, art como erupcin cutnea o urticarias, hinchazn de la cara, labios o lengua -dolor en el  pecho -tos -dificultad al respirar -mareos, somnolencia -fiebre o infeccin -molestias o dolores articulares -piel plida o teida azul -enrojecimiento, formacin de ampollas, descamacin o aflojamiento de la piel, inclusive dentro de la boca -hormigueo, ardor, Engineer, mining o entumecimiento de las manos o los pies -sangrado o magulladuras inusuales -cansancio o debilidad inusual -color amarillento de ojos o piel Efectos secundarios que, por lo general, no requieren Psychologist, prison and probation services (debe informarlos a su mdico o a su profesional de la salud si persisten o si son molestos): -orina de color amarillo oscuro -diarrea -dolor de cabeza -prdida del apetito -nuseas o vmitos -prdida del cabello temporal Puede ser que esta lista no menciona todos los posibles efectos secundarios. Comunquese a su mdico por asesoramiento mdico [employer]lett[employer]ard. Usted puede informar los efectos secundarios a la FDA por telfono al 1-800-FDA-1088. Dnde debo guardar mi medicina? Mantngala fuera del alcance de los nios. Gurdela a San[employer]-SCI, entre 15 y 30 grados C (23 y 8 grados F). Protjala de la luz. Deseche todo el medicamento que no haya utilizado, despus de la fecha de vencimiento. ATENCIN: Este folleto es un resumen. Puede ser que no cubra toda la posible informacin. Si usted tiene preguntas acerca de esta medicina, consulte con su mdico, su farmacutico o su profesional de Radiographer, therapeutic.    2016, Elsevier/Gold Standard. (2014-04-02 00:00:00) Infeccin urinaria  (Urinary Tract Infection)  La infeccin urinaria puede ocurrir en cualquier lugar del tracto urinario. El tracto urinario es un sistema de drenaje del cuerpo por el que se [employer]  desechos y el exceso de [place]. El tracto urinario est formado por dos riones, dos urteres, la vejiga y Engineer, mining. Los riones son rganos que tienen forma de frijol. Cada rin tiene aproximadamente el tamao del puo. Estn situados debajo de  las [place], uno a cada lado de la columna vertebral CAUSAS  La causa de la infeccin son los microbios, que son organismos microscpicos, que incluyen hongos, virus, y bacterias. Estos organismos son tan pequeos que slo pueden verse a travs del microscopio. Las bacterias son los microorganismos que ms comnmente causan infecciones urinarias.  SNTOMAS  Los sntomas pueden variar segn la edad y el sexo del paciente y por la ubicacin de la infeccin. Los sntomas en las mujeres jvenes incluyen la necesidad frecuente e intensa de orinar y una sensacin dolorosa de ardor en la vejiga o en la uretra durante la miccin. Las mujeres y los hombres mayores podrn sentir cansancio, temblores y debilidad y Futures trader musculares y Engineer, mining abdominal. Si tiene [place], puede significar que la infeccin est en los riones. Otros sntomas son dolor en la espalda o en los lados debajo de las [place], nuseas y vmitos.  DIAGNSTICO  Para diagnosticar una infeccin urinaria, el mdico le preguntar acerca de sus sntomas. [employer] una [place] de [place]. La muestra de orina se analiza para Engineer, manufacturing bacterias y glbulos blancos de Risk manager. Los glbulos blancos se forman en el organismo para ayudar a Artist las infecciones.  TRATAMIENTO  Por lo general, las infecciones urinarias pueden tratarse con medicamentos. Debido a que la [employer]son de las infecciones son causadas por bacterias, por lo general pueden tratarse con antibiticos. La eleccin del antibitico y la duracin del tratamiento depender de sus sntomas y el tipo de bacteria causante de la infeccin.  INSTRUCCIONES PARA EL CUIDADO EN EL HOGAR   Si le recetaron antibiticos, tmelos exactamente como su mdico le indique. Termine el medicamento aunque se sienta mejor despus de haber tomado slo algunos.  Beba gran cantidad de lquido para mantener la orina de tono claro o color amarillo plido.  Evite la cafena, el t y las [address]. Estas sustancias irritan la vejiga.  Vaciar la vejiga con frecuencia. Evite retener la orina durante largos perodos.  Vace la vejiga antes y despus de Management consultant.  Despus de mover el intestino, las mujeres deben higienizarse la regin perineal desde adelante hacia atrs. Use slo un papel tissue por vez. SOLICITE ATENCIN MDICA SI:   Siente dolor en la espalda.  Le sube la fiebre.  Los sntomas no mejoran luego de [address]. SOLICITE ATENCIN MDICA DE INMEDIATO SI:   Siente dolor intenso en la espalda o en la zona inferior del abdomen.  Comienza a sentir escalofros.  Tiene nuseas o vmitos.  Tiene una sensacin continua de quemazn o molestias al [employer]. ASEGRESE DE QUE:   Comprende estas instrucciones.  Controlar su enfermedad.  Solicitar ayuda de inmediato si no mejora o empeora.   Esta informacin no tiene Theme park manager el consejo del mdico. Asegrese de hacerle al mdico cualquier pregunta que tenga.   Document Released: 11/18/2004 Document Revised: 11/03/2011 Elsevier Interactive Patient Education [employer]

## 2015-10-15 ENCOUNTER — Other Ambulatory Visit: Payer: Self-pay | Admitting: Gynecology

## 2015-10-15 ENCOUNTER — Other Ambulatory Visit: Payer: Self-pay | Admitting: Anesthesiology

## 2015-10-15 DIAGNOSIS — N3001 Acute cystitis with hematuria: Secondary | ICD-10-CM

## 2015-10-15 LAB — URINE CULTURE: Organism ID, Bacteria: 10000

## 2015-10-15 MED ORDER — NITROFURANTOIN MONOHYD MACRO 100 MG PO CAPS: 100.0000 mg | ORAL_CAPSULE | Freq: Two times a day (BID) | ORAL | 0 refills | Status: DC

## 2015-10-31 ENCOUNTER — Encounter: Payer: Self-pay | Admitting: Gynecology

## 2015-11-05 ENCOUNTER — Encounter: Payer: Self-pay | Admitting: Gynecology

## 2015-11-05 DIAGNOSIS — Z0289 Encounter for other administrative examinations: Secondary | ICD-10-CM

## 2015-11-14 ENCOUNTER — Encounter: Payer: Self-pay | Admitting: Gynecology

## 2015-11-14 ENCOUNTER — Ambulatory Visit (INDEPENDENT_AMBULATORY_CARE_PROVIDER_SITE_OTHER): Payer: Medicaid Other | Admitting: Gynecology

## 2015-11-14 VITALS — BP 112/70 | Ht 59.0 in | Wt 119.0 lb

## 2015-11-14 DIAGNOSIS — Z01419 Encounter for gynecological examination (general) (routine) without abnormal findings: Secondary | ICD-10-CM | POA: Diagnosis not present

## 2015-11-14 DIAGNOSIS — Z0000 Encounter for general adult medical examination without abnormal findings: Secondary | ICD-10-CM

## 2015-11-14 DIAGNOSIS — N979 Female infertility, unspecified: Secondary | ICD-10-CM

## 2015-11-14 DIAGNOSIS — Z113 Encounter for screening for infections with a predominantly sexual mode of transmission: Secondary | ICD-10-CM

## 2015-11-14 LAB — CBC WITH DIFFERENTIAL/PLATELET
Basophils Absolute: 0 {cells}/uL (ref 0–200)
Basophils Relative: 0 %
Eosinophils Absolute: 71 {cells}/uL (ref 15–500)
Eosinophils Relative: 1 %
HCT: 41.7 % (ref 35.0–45.0)
Hemoglobin: 14 g/dL (ref 11.7–15.5)
Lymphocytes Relative: 29 %
Lymphs Abs: 2059 {cells}/uL (ref 850–3900)
MCH: 28.7 pg (ref 27.0–33.0)
MCHC: 33.6 g/dL (ref 32.0–36.0)
MCV: 85.5 fL (ref 80.0–100.0)
MPV: 10.7 fL (ref 7.5–12.5)
Monocytes Absolute: 355 {cells}/uL (ref 200–950)
Monocytes Relative: 5 %
Neutro Abs: 4615 {cells}/uL (ref 1500–7800)
Neutrophils Relative %: 65 %
Platelets: 244 10*3/uL (ref 140–400)
RBC: 4.88 MIL/uL (ref 3.80–5.10)
RDW: 14 % (ref 11.0–15.0)
WBC: 7.1 10*3/uL (ref 3.8–10.8)

## 2015-11-14 MED ORDER — DOXYCYCLINE HYCLATE 100 MG PO CAPS: 100.0000 mg | ORAL_CAPSULE | Freq: Two times a day (BID) | ORAL | 0 refills | Status: DC

## 2015-11-14 NOTE — Progress Notes (Signed)
Jamie Hansen 04/09/1995 [id number]   History:    20 y.o.  for annual gyn exam gravida 1 para 1 not using any form of contraception asymptomatic today. Patient reports normal menstrual cycle. Patient has been with unprotected intercourse for the past 2 years with the same partner has not been able to conceive. Her only child is from a previous relationship and her new partner has had a child with another relationship.  Her boyfriend is in the [employer]	[place]. Patient with past history of Chlamydia infection several years ago. Patient later the flu vaccine today.  Past medical history,surgical history, family history and social history were all reviewed and documented in the EPIC chart.  Gynecologic History Patient's last menstrual period was 10/20/2015. Contraception: none Last Pap: Not indicated. Results were: Not indicated Last mammogram: Not indicated. Results were: Not indicated  Obstetric History OB History  Gravida Para Term Preterm AB Living  1 1 1     1   SAB TAB Ectopic Multiple Live Births          1    # Outcome Date GA Lbr Len/2nd Weight Sex Delivery Anes PTL Lv  1 Term      CS-LTranv   LIV       ROS: A ROS was performed and pertinent positives and negatives are included in the history.  GENERAL: No fevers or chills. HEENT: No change in vision, no earache, sore throat or sinus congestion. NECK: No pain or stiffness. CARDIOVASCULAR: No chest pain or pressure. No palpitations. PULMONARY: No shortness of breath, cough or wheeze. GASTROINTESTINAL: No abdominal pain, nausea, vomiting or diarrhea, melena or bright red blood per rectum. GENITOURINARY: No urinary frequency, urgency, hesitancy or dysuria. MUSCULOSKELETAL: No joint or muscle pain, no back pain, no recent trauma. DERMATOLOGIC: No rash, no itching, no lesions. ENDOCRINE: No polyuria, polydipsia, no heat or cold intolerance. No recent change in weight. HEMATOLOGICAL: No anemia or easy bruising or bleeding.  NEUROLOGIC: No headache, seizures, numbness, tingling or weakness. PSYCHIATRIC: No depression, no loss of interest in normal activity or change in sleep pattern.     Exam: chaperone present  BP 112/70   Ht 4\' 11"  (1.499 m)   Wt 119 lb (54 kg)   LMP 10/20/2015   BMI 24.04 kg/m   Body mass index is 24.04 kg/m.  General appearance : Well developed well nourished female. No acute distress HEENT: Eyes: no retinal hemorrhage or exudates,  Neck supple, trachea midline, no carotid bruits, no thyroidmegaly Lungs: Clear to auscultation, no rhonchi or wheezes, or rib retractions  Heart: Regular rate and rhythm, no murmurs or gallops Breast:Examined in sitting and supine position were symmetrical in appearance, no palpable masses or tenderness,  no skin retraction, no nipple inversion, no nipple discharge, no skin discoloration, no axillary or supraclavicular lymphadenopathy Abdomen: no palpable masses or tenderness, no rebound or guarding Extremities: no edema or skin discoloration or tenderness  Pelvic:  Bartholin, Urethra, Skene Glands: Within normal limits             Vagina: No gross lesions or discharge  Cervix: No gross lesions or discharge  Uterus  anteverted, normal size, shape and consistency, non-tender and mobile  Adnexa  Without masses or tenderness  Anus and perineum  normal   Rectovaginal  normal sphincter tone without palpated masses or tenderness             Hemoccult not indicated     Assessment/Plan:  21 y.o. female for annual exam  with secondary infertility will be scheduled for an HSG after her next menstrual cycle. The procedure was outlined. Literature information was provided. She'll be prescribed Vibramycin 100 mg to take twice a day for 3 days starting the day before the procedure for prophylaxis. We also discussed importance of prenatal vitamins for neural tube defect prophylaxis. We also discussed and literature information was provided so that her boyfriend we'll  get a semen analysis and give us the report and then she will make an appointment to discuss these studies and plan a course of management. A GC and Chlamydia culture was done today along with a CBC and urinalysis. Patient received the flu vaccine today.   Ok EdwardsFERNANDEZ,Devontae Casasola H MD, 12:52 PM 11/14/2015

## 2015-11-14 NOTE — Patient Instructions (Signed)
Influenza Virus Vaccine (Flucelvax) Qu es este medicamento? La VACUNA ANTIGRIPAL ayuda a disminuir el riesgo de contraer la influenza, tambin conocida como la gripe. La vacuna solo ayuda a protegerle contra algunas cepas de influenza. Este medicamento puede ser utilizado para otros usos; si tiene alguna pregunta consulte con su proveedor de atencin mdica o con su farmacutico. Qu le debo informar a mi profesional de la salud antes de tomar este medicamento? Necesita saber si usted presenta alguno de los siguientes problemas o situaciones: -trastorno de sangrado como hemofilia -fiebre o infeccin -sndrome de Guillain-Barre u otros problemas neurolgicos -problemas del sistema inmunolgico -infeccin por el virus de la inmunodeficiencia humana (VIH) o SIDA -niveles bajos de plaquetas en la sangre -esclerosis mltiple -una reaccin alrgica o inusual a las vacunas antigripales, a otros medicamentos, alimentos, colorantes o conservantes -si est embarazada o buscando quedar embarazada -si est amamantando a un beb Cmo debo utilizar este medicamento? Esta vacuna se administra mediante inyeccin por va intramuscular. Lo administra un profesional de [employer]	[place]. Recibir una copia de informacin escrita sobre la vacuna antes de cada vacuna. Asegrese de leer este folleto cada vez cuidadosamente. Este folleto puede cambiar con frecuencia. Hable con su pediatra para informarse acerca del uso de este medicamento en nios. Puede requerir atencin especial. Sobredosis: Pngase en contacto inmediatamente con un centro toxicolgico o una sala de urgencia si usted cree que haya tomado demasiado medicamento. ATENCIN: [employer] es solo para usted. No comparta este medicamento con nadie. Qu sucede si me olvido de una dosis? No se aplica en este caso. Qu puede interactuar con este medicamento? -quimioterapia o radioterapia -medicamentos que suprimen el sistema inmunolgico, tales como  etanercept, anakinra, infliximab y adalimumab -medicamentos que tratan o previenen cogulos sanguneos, como warfarina -fenitona -medicamentos esteroideos, como la prednisona o la cortisona -teofilina -vacunas Puede ser que esta lista no menciona todas las posibles interacciones. Informe a su profesional de [employer]	[place] de [employer] productos a base de hierbas, medicamentos de [place] o suplementos nutritivos que est tomando. Si usted fuma, consume bebidas alcohlicas o si utiliza drogas ilegales, indqueselo tambin a su profesional de [employer]	[place]. Algunas sustancias pueden interactuar con su medicamento. A qu debo estar atento al usar [employer]? Informe a su mdico o a Barrister's clerk de la [employer] todos los efectos secundarios que persistan despus de 3 das. Llame a su proveedor de atencin mdica si se presentan sntomas inusuales dentro de las 6 semanas de recibir esta vacuna. Es posible que todava pueda contraer la gripe, pero la enfermedad no ser tan fuerte como normalmente. No puede contraer la gripe de esta vacuna. La vacuna antigripal no le protege contra resfros u otras enfermedades que pueden causar [place]. Debe vacunarse cada ao. Qu efectos secundarios puedo tener al [employer] este medicamento? Efectos secundarios que debe informar a su mdico o a Barrister's clerk de la salud tan pronto como sea posible: -reacciones alrgicas como erupcin cutnea, picazn o urticarias, hinchazn de la cara, labios o lengua Efectos secundarios que, por lo general, no requieren atencin mdica (debe informarlos a su mdico o a su profesional de la salud si persisten o si son molestos): -fiebre -dolor de cabeza -molestias y dolores musculares -dolor, sensibilidad, enrojecimiento o Estate agent de la inyeccin -cansancio Puede ser que esta lista no menciona todos los posibles efectos secundarios. Comunquese a su mdico por asesoramiento mdico [employer]. Usted  puede informar los efectos secundarios a la FDA por telfono al 1-800-FDA-1088. Dnde  debo guardar mi medicina? Esta vacuna se administrar por un profesional de la salud en una [place], Corporate investment banker, consultorio mdico u otro consultorio de un profesional de la salud. No se le suministrar esta vacuna para guardar en su domicilio. ATENCIN: Este folleto es un resumen. Puede ser que no cubra toda la posible informacin. Si usted tiene preguntas acerca de esta medicina, consulte con su mdico, su farmacutico o su profesional de Radiographer, therapeutic.    2016, Elsevier/Gold Standard. (2011-01-25 16:29:16) Espermograma (Semen Analysis Test) POR QU ME DEBO REALIZAR ESTE EXAMEN? El espermograma se [place] para Chief Operating Officer ciertos aspectos de la salud de los rganos reproductivos del hombre (testculos) y el sistema hormonal que cumple una funcin en la produccin de semen. El semen es una secrecin blanquecina que se expulsa desde el pene durante la fase final del orgasmo (eyaculacin). Est compuesto por lquidos y nutrientes de la prstata, las vesculas seminales y otras glndulas. Tambin contiene espermatozoides de los testculos. Un solo espermatozoide contiene una serie completa del cdigo gentico de un hombre (cromosomas). Este [employer] puede realizarse como parte de la prueba de esterilidad, que se efecta para ayudar a Chief Strategy Officer las causas de la incapacidad de [place]. Cuando el espermograma se realiza por esta causa, la prueba incluir la forma (morfologa), el tamao y el movimiento (movilidad) de los espermatozoides. En este anlisis tambin se puede incluir el examen de la capacidad de los espermatozoides de Customer service manager en un vulo Camera operator) as Immunologist formacin del material gentico (ADN). Tambin puede realizarse un espermograma para determinar si una vasectoma realizada con anterioridad fue satisfactoria. Una vasectoma es un procedimiento que se realiza para hacer que un hombre sea estril de forma  [place]. Consiste en la ligadura del conducto que recibe el semen del testculo, que se denomina conducto deferente. En una vasectoma, se impide que el semen atraviese el conducto deferente y el pene de modo que no se introduzca en la vagina durante la relacin sexual. QU TIPO DE MUESTRA SE TOMA? Para esta prueba, se extrae Lauris Poag de semen. TENDR QUE RECOGER LAS MUESTRAS EN MI CASA? Se recolectar Lauris Poag de semen al eyacular en un recipiente de vidrio o plstico estril provisto por el laboratorio. La prueba puede hacerse en el hogar, el consultorio del mdico o en el laboratorio. Si la muestra se Engineer, production, siga las instrucciones del mdico sobre cmo obtener la [place]. [place] Fetter debe entregarse al laboratorio en el lapso de 1 hora despus de la recoleccin. Tambin debe protegerse del calor o el fro extremos. CMO DEBO PREPARARME PARA ESTA PRUEBA? Para la prueba de esterilidad: Evite la actividad sexual durante 2 a 3 das antes de la recoleccin de la [place] de semen. Sin embargo, no evite Psychiatrist un perodo prolongado porque esto puede Special educational needs teacher movilidad de los espermatozoides. Para la prueba en la que se determina si la vasectoma fue satisfactoria: Asegrese de Civil Service fast streamer o dos veces antes del da de recoleccin de la [place] de semen. Esto despejar el conducto deferente del semen que estuvo presente antes de la vasectoma. QU SON LOS VALORES DE REFERENCIA? Los valores de referencia son los valores saludables establecidos despus de Education officer, environmental el anlisis a un grupo grande de personas sanas. Pueden variar [employer], laboratorios y hospitales. Es su responsabilidad retirar el resultado del [place]. Consulte en el laboratorio o en el departamento en el que fue realizado el estudio cundo y cmo podr [employer].   Volumen: de 2 a 5  ml.  Tiempo de licuefaccin: de 20 a 30 minutos despus de la  recoleccin.  Aspecto: normal (color blanquecino).  Movilidad/ml: mayor o igual que [id number] ( ).  Semen/ml: mayor o igual que [id number] ( ).  Viscosidad: mayor o igual que 3.  Aglutinacin: mayor o igual que 3.  Supravital: mayor o igual que el 75% vivos.  Fructosa: positivo.  pH: de 7,12 a 8.  Recuento de espermatozoides (densidad): mayor o igual que 40millones/ml.  Movilidad espermtica: mayor o igual que el 50% en una hora.  Morfologa de los espermatozoides: mayor que el 30% (criterio de [place] mayor que el 14%) de formas normales. QU SIGNIFICAN LOS RESULTADOS? El bajo nmero de espermatozoides, su movilidad o su morfologa anormales pueden causar problemas de esterilidad masculina. Cuando el espermograma se realiza para determinar si la vasectoma fue satisfactoria, la presencia de semen puede indicar que la ciruga no fue exitosa. El mdico puede sugerir que se repita la prueba. Hable con el mdico [employer], las opciones de tratamiento y, si es necesario, la necesidad de Education officer, environmental ms [place]. Hable con el mdico si tiene [employer].   Esta informacin no tiene Theme park manager el consejo del mdico. Asegrese de hacerle al mdico cualquier pregunta que tenga.   Document Released: 11/29/2012 Document Revised: 03/01/2014 Elsevier Interactive Patient Education 2016 [employer]. Histerosalpingografa (Hysterosalpingography) La histerosalpingografa es un procedimiento para observar el interior del tero y las trompas de [place]. Durante este procedimiento, se inyecta una sustancia de contraste en el tero, a travs de la vagina y el cuello del tero para poder visualizar el tero mientras se toman imgenes radiogrficas. Este procedimiento puede ayudar al mdico a diagnosticar tumores, adherencias o anormalidades estructurales en el tero. Generalmente, se [place] para determinar las razones por las que una  mujer no puede tener hijos (infertilidad). El procedimiento suele durar entre 15 y 30 minutos. INFORME A SU MDICO:  Cualquier alergia que tenga.  Todos los Walt Disney, incluidos vitaminas, hierbas, gotas oftlmicas, cremas y [address] de [address].  Problemas previos que usted o los [employer] de su familia hayan tenido con el uso de anestsicos.  Enfermedades de [employer].  Cirugas previas.  Afecciones mdicas que tenga. RIESGOS Y COMPLICACIONES  En general, se trata de un procedimiento seguro. Sin embargo, Photographer, pueden surgir problemas. Estos son algunos posibles problemas:  Infeccin en el revestimiento interior del tero (endometritis) o en las trompas de [place] (salpingitis).  Dao o perforacin del tero o las trompas de [place].  Reaccin alrgica a la sustancia de [place] para tomar la radiografa. ANTES DEL PROCEDIMIENTO   Debe planificar el procedimiento para despus que finalice su perodo, pero antes de su prxima ovulacin. Esto ocurre por lo general [employer] 5 y 10 de su ltimo perodo. El Da 1 es Film/video editor de su perodo.  Consulte a su mdico si debe cambiar o suspender los medicamentos que toma habitualmente.  Podr comer y beber normalmente.  Antes de comenzar el procedimiento, vace la vejiga. PROCEDIMIENTO  Es posible que le administren un medicamento para Lexicographer (sedante) o un analgsico de venta libre para Paramedic las molestias durante el procedimiento.  Deber acostarse en una mesa de radiografas con los pies en los estribos.  Le colocarn en la vagina un dispositivo llamado espculo. Esto permite al mdico observar el interior de la vagina hasta el cuello del tero.  El cuello del tero se lava con un jabn especial.  Luego se pasa un tubo delgado y flexible a travs del cuello del tero hasta el tero.  Por el tubo se colocar una sustancia de [place].  Le tomarn varias  radiografas a medida que el contraste pasa a travs del tero y las trompas de [place].  Despus del procedimiento se retira el tubo. DESPUS DEL PROCEDIMIENTO   La mayor parte de la sustancia de contraste se eliminar de la vagina naturalmente. Puede ser necesario que use un apsito sanitario.  Puede sentir clicos leves y notar una hemorragia vaginal leve. Luego de 24 horas deben desaparecer.  Pregunte cundo Hughes Supplyestarn listos los resultados. Asegrese de Starbucks Corporationobtener los resultados.   Esta informacin no tiene Theme park managercomo fin reemplazar el consejo del mdico. Asegrese de hacerle al mdico cualquier pregunta que tenga.   Document Released: 05/03/2011 Document Revised: 02/13/2013 Elsevier Interactive Patient Education [employer] Inc2016 Elsevier Inc.

## 2015-11-15 LAB — URINALYSIS W MICROSCOPIC + REFLEX CULTURE
BILIRUBIN URINE: NEGATIVE
Bacteria, UA: NONE SEEN [HPF]
CRYSTALS: NONE SEEN [HPF]
Casts: NONE SEEN [LPF]
Glucose, UA: NEGATIVE
Hgb urine dipstick: NEGATIVE
Ketones, ur: NEGATIVE
LEUKOCYTES UA: NEGATIVE
Nitrite: NEGATIVE
Protein, ur: NEGATIVE
RBC / HPF: NONE SEEN RBC/HPF (ref <=2)
SPECIFIC GRAVITY, URINE: 1.013 (ref 1.001–1.035)
WBC UA: NONE SEEN WBC/HPF (ref <=5)
Yeast: NONE SEEN [HPF]
pH: 7 (ref 5.0–8.0)

## 2015-11-15 LAB — GC/CHLAMYDIA PROBE AMP
CT PROBE, AMP APTIMA: NOT DETECTED
GC PROBE AMP APTIMA: NOT DETECTED

## 2015-11-17 ENCOUNTER — Telehealth: Payer: Self-pay | Admitting: *Deleted

## 2015-11-17 DIAGNOSIS — N979 Female infertility, unspecified: Secondary | ICD-10-CM

## 2015-11-17 NOTE — Telephone Encounter (Signed)
-----   Message from Jerilynn Mageslaudia Shaffer sent at 11/17/2015  9:31 AM EDT ----- Regarding: hsg Please schedule hsg for JF pt. LMP 11-16-15. She prefers morning appointment. Thx

## 2015-11-17 NOTE — Telephone Encounter (Signed)
[facility] pt will be reminded to take doxycycline 100 mg twice a day for 3 days starting day of procedure and no sex prior to procedure. Debarah CrapeClaudia will relay all this information in spanish for pt.

## 2015-11-18 NOTE — Telephone Encounter (Signed)
Pt will need to be self pay due to insurance medicaid and out of pocket cost [phone], pt would like to have canceled unable to afford this procedure.

## 2015-11-19 ENCOUNTER — Ambulatory Visit (INDEPENDENT_AMBULATORY_CARE_PROVIDER_SITE_OTHER): Payer: Medicaid Other

## 2015-11-19 DIAGNOSIS — Z3200 Encounter for pregnancy test, result unknown: Secondary | ICD-10-CM

## 2015-11-19 DIAGNOSIS — Z3202 Encounter for pregnancy test, result negative: Secondary | ICD-10-CM | POA: Diagnosis not present

## 2015-11-19 LAB — POCT PREGNANCY, URINE: PREG TEST UR: NEGATIVE

## 2015-11-19 NOTE — Progress Notes (Signed)
Patient presented to clinic for pregnancy test. Test confirms she is not pregnant at this time. Patient was happy with these results.

## 2015-11-24 ENCOUNTER — Ambulatory Visit (HOSPITAL_COMMUNITY): Payer: Medicaid Other

## 2015-12-26 ENCOUNTER — Ambulatory Visit: Payer: Medicaid Other | Admitting: Gynecology

## 2015-12-26 DIAGNOSIS — Z0289 Encounter for other administrative examinations: Secondary | ICD-10-CM

## 2016-03-19 ENCOUNTER — Inpatient Hospital Stay (HOSPITAL_COMMUNITY)
Admission: AD | Admit: 2016-03-19 | Discharge: 2016-03-20 | Disposition: A | Discharge status: Home / Self Care | Payer: Medicaid Other | Source: Ambulatory Visit | Attending: Gynecology | Admitting: Gynecology

## 2016-03-19 DIAGNOSIS — B9689 Other specified bacterial agents as the cause of diseases classified elsewhere: Secondary | ICD-10-CM | POA: Insufficient documentation

## 2016-03-19 DIAGNOSIS — Z91040 Latex allergy status: Secondary | ICD-10-CM | POA: Insufficient documentation

## 2016-03-19 DIAGNOSIS — R102 Pelvic and perineal pain: Secondary | ICD-10-CM

## 2016-03-19 DIAGNOSIS — N760 Acute vaginitis: Secondary | ICD-10-CM | POA: Insufficient documentation

## 2016-03-19 DIAGNOSIS — Z87891 Personal history of nicotine dependence: Secondary | ICD-10-CM | POA: Insufficient documentation

## 2016-03-19 LAB — URINALYSIS, ROUTINE W REFLEX MICROSCOPIC
BILIRUBIN URINE: NEGATIVE
Glucose, UA: NEGATIVE mg/dL
Hgb urine dipstick: NEGATIVE
Ketones, ur: NEGATIVE mg/dL
LEUKOCYTES UA: NEGATIVE
NITRITE: NEGATIVE
PH: 5 (ref 5.0–8.0)
Protein, ur: NEGATIVE mg/dL
SPECIFIC GRAVITY, URINE: 1.013 (ref 1.005–1.030)

## 2016-03-19 LAB — CBC
HEMATOCRIT: 39.6 % (ref 36.0–46.0)
HEMOGLOBIN: 14.1 g/dL (ref 12.0–15.0)
MCH: 28.8 pg (ref 26.0–34.0)
MCHC: 35.6 g/dL (ref 30.0–36.0)
MCV: 80.8 fL (ref 78.0–100.0)
Platelets: 243 10*3/uL (ref 150–400)
RBC: 4.9 MIL/uL (ref 3.87–5.11)
RDW: 13.8 % (ref 11.5–15.5)
WBC: 9.3 10*3/uL (ref 4.0–10.5)

## 2016-03-19 LAB — POCT PREGNANCY, URINE: Preg Test, Ur: NEGATIVE

## 2016-03-19 LAB — HCG, QUANTITATIVE, PREGNANCY: hCG, Beta Chain, Quant, S: <1 m[IU]/mL (ref <5)

## 2016-03-19 NOTE — MAU Note (Signed)
I want to see if I am pregnant. Has gotten 4 positive upts. LMP 02/25/16. Last period was late. Alittle lower abd pain but no bleeding.

## 2016-03-20 DIAGNOSIS — R102 Pelvic and perineal pain: Secondary | ICD-10-CM

## 2016-03-20 LAB — ABO/RH: ABO/RH(D): O POS

## 2016-03-20 LAB — WET PREP, GENITAL
Sperm: NONE SEEN
Trich, Wet Prep: NONE SEEN
Yeast Wet Prep HPF POC: NONE SEEN

## 2016-03-20 LAB — HIV ANTIBODY (ROUTINE TESTING W REFLEX): HIV SCREEN 4TH GENERATION: NONREACTIVE

## 2016-03-20 MED ORDER — METRONIDAZOLE 500 MG PO TABS: 500.0000 mg | ORAL_TABLET | Freq: Two times a day (BID) | ORAL | 0 refills | Status: AC

## 2016-03-20 NOTE — Progress Notes (Signed)
Sharen CounterLisa Leftwich-Kirby CNM in to discuss test results and d/c plan using [place] video interpreter. Understanding voiced

## 2016-03-20 NOTE — MAU Provider Note (Signed)
Chief Complaint: to see if i am pregnant   First Provider Initiated Contact with Patient 03/19/16 2340      SUBJECTIVE HPI: Jamie Hansen is a 21 y.o. G1P1001 who presents to maternity admissions reporting pelvic pain x 1 week and positive home pregnancy tests x 4 yesterday.  She reports the lines on the pregnancy test were faint but indicated positive on each one.  Patient's last menstrual period was 02/25/2016.  She has regular periods.  This is a new symptom, the pain is low in her abdomen/pelvis and sharp and it is intermittent and unchanged since onset.  She has not tried any treatments. Nothing makes it better or worse.   She denies vaginal bleeding, vaginal itching/burning, urinary symptoms, h/a, dizziness, n/v, or fever/chills.    Video interpreter with spanish language used for all communication.  HPI  Past Medical History:  Diagnosis Date  . Bipolar 1 disorder (HCC)   . Chlamydia   . Depression   . Pregnancy induced hypertension    Past Surgical History:  Procedure Laterality Date  . CESAREAN SECTION    . HERNIA REPAIR     Social History   Social History  . Marital status: Divorced    Spouse name: N/A  . Number of children: N/A  . Years of education: N/A   Occupational History  . Not on file.   Social History Main Topics  . Smoking status: Former Games developermoker  . Smokeless tobacco: Never Used  . Alcohol use No  . Drug use: No  . Sexual activity: Yes    Partners: Female    Birth control/ protection: None     Comment: 1ST INTERCOURSE- 14, PARTNERS- 3   Other Topics Concern  . Not on file   Social History Narrative  . No narrative on file   No current facility-administered medications on file prior to encounter.    No current outpatient prescriptions on file prior to encounter.   Allergies  Allergen Reactions  . Latex Itching and Other (See Comments)    Itching and yeast like symptoms with latex condom use.    ROS:  Review of Systems   Constitutional: Negative for chills, fatigue and fever.  Respiratory: Negative for shortness of breath.   Cardiovascular: Negative for chest pain.  Gastrointestinal: Negative for nausea and vomiting.  Genitourinary: Positive for pelvic pain. Negative for difficulty urinating, dysuria, flank pain, vaginal bleeding, vaginal discharge and vaginal pain.  Neurological: Negative for dizziness and headaches.  Psychiatric/Behavioral: Negative.      I have reviewed patient's Past Medical Hx, Surgical Hx, Family Hx, Social Hx, medications and allergies.   Physical Exam   Patient Vitals for the past 24 hrs:  BP Temp Temp src Pulse Resp Height Weight  03/20/16 0045 133/65 98.8 F (37.1 C) Oral 81 16 - -  03/19/16 2234 139/89 98.9 F (37.2 C) Oral 96 18 - -  03/19/16 2044 137/84 98.1 F (36.7 C) - 94 18 4\' 11"  (1.499 m) 120 lb 12.8 oz (54.8 kg)   Constitutional: Well-developed, well-nourished female in no acute distress.  Cardiovascular: normal rate Respiratory: normal effort GI: Abd soft, non-tender. Pos BS x 4 MS: Extremities nontender, no edema, normal ROM Neurologic: Alert and oriented x 4.  GU: Neg CVAT.  PELVIC EXAM: Cervix pink, visually closed, without lesion, scant white creamy discharge, vaginal walls and external genitalia normal Bimanual exam: Cervix 0/long/high, firm, anterior, neg CMT, uterus nontender, nonenlarged, adnexa without tenderness, enlargement, or mass   LAB RESULTS  Results for orders placed or performed during the hospital encounter of 03/19/16 (from the past 24 hour(s))  CBC     Status: None   Collection Time: 03/19/16  9:51 PM  Result Value Ref Range   WBC 9.3 4.0 - 10.5 K/uL   RBC 4.90 3.87 - 5.11 MIL/uL   Hemoglobin 14.1 12.0 - 15.0 g/dL   HCT 69.6 29.5 - 28.4 %   MCV 80.8 78.0 - 100.0 fL   MCH 28.8 26.0 - 34.0 pg   MCHC 35.6 30.0 - 36.0 g/dL   RDW 13.2 44.0 - 10.2 %   Platelets 243 150 - 400 K/uL  hCG, quantitative, pregnancy     Status: None    Collection Time: 03/19/16  9:51 PM  Result Value Ref Range   hCG, Beta Chain, Quant, S <1 <5 mIU/mL  ABO/Rh     Status: None   Collection Time: 03/19/16  9:51 PM  Result Value Ref Range   ABO/RH(D) O POS   Urinalysis, Routine w reflex microscopic     Status: None   Collection Time: 03/19/16 10:15 PM  Result Value Ref Range   Color, Urine YELLOW YELLOW   APPearance CLEAR CLEAR   Specific Gravity, Urine 1.013 1.005 - 1.030   pH 5.0 5.0 - 8.0   Glucose, UA NEGATIVE NEGATIVE mg/dL   Hgb urine dipstick NEGATIVE NEGATIVE   Bilirubin Urine NEGATIVE NEGATIVE   Ketones, ur NEGATIVE NEGATIVE mg/dL   Protein, ur NEGATIVE NEGATIVE mg/dL   Nitrite NEGATIVE NEGATIVE   Leukocytes, UA NEGATIVE NEGATIVE  Pregnancy, urine POC     Status: None   Collection Time: 03/19/16 10:32 PM  Result Value Ref Range   Preg Test, Ur NEGATIVE NEGATIVE  Wet prep, genital     Status: Abnormal   Collection Time: 03/19/16 11:55 PM  Result Value Ref Range   Yeast Wet Prep HPF POC NONE SEEN NONE SEEN   Trich, Wet Prep NONE SEEN NONE SEEN   Clue Cells Wet Prep HPF POC PRESENT (A) NONE SEEN   WBC, Wet Prep HPF POC FEW (A) NONE SEEN   Sperm NONE SEEN     --/--/O POS (01/26 2151)  IMAGING No results found.  MAU Management/MDM: Ordered labs and reviewed results.  With quant hcg <1, no evidence of recent pregnancy so likely false positive pregnancy tests at home.  No evidence of PID or acute pelvic/abdominal process.  BV on wet prep. Treat with Flagyl 500 mg BID x 7 days.  Pt to f/u with Gyn provider if pelvic pain persists/worsens, return to MAU as needed for emergencies. Pt stable at time of discharge.  ASSESSMENT 1. Acute pelvic pain, female   2. BV (bacterial vaginosis)     PLAN Discharge home  Allergies as of 03/20/2016      Reactions   Latex Itching, Other (See Comments)   Itching and yeast like symptoms with latex condom use.      Medication List    STOP taking these medications   doxycycline 100  MG capsule Commonly known as:  VIBRAMYCIN   naproxen 500 MG tablet Commonly known as:  NAPROSYN   nitrofurantoin (macrocrystal-monohydrate) 100 MG capsule Commonly known as:  MACROBID   phenazopyridine 200 MG tablet Commonly known as:  PYRIDIUM     TAKE these medications   metroNIDAZOLE 500 MG tablet Commonly known as:  FLAGYL Take 1 tablet (500 mg total) by mouth 2 (two) times daily.      Follow-up Information  Ok Edwards, MD Follow up.   Specialties:  Gynecology, Radiology Why:  As needed if symptoms persist. Return to MAU as needed for emergencies. Contact information: [address] [place] [address] [phone]           Sharen Counter Certified Nurse-Midwife 03/20/2016  7:31 AM

## 2016-03-23 LAB — GC/CHLAMYDIA PROBE AMP (~~LOC~~) NOT AT ARMC
CHLAMYDIA, DNA PROBE: NEGATIVE
NEISSERIA GONORRHEA: NEGATIVE

## 2016-07-07 ENCOUNTER — Encounter: Payer: Self-pay | Admitting: Gynecology

## 2016-07-11 ENCOUNTER — Inpatient Hospital Stay (HOSPITAL_COMMUNITY): Payer: Self-pay

## 2016-07-11 ENCOUNTER — Encounter (HOSPITAL_COMMUNITY): Payer: Self-pay

## 2016-07-11 ENCOUNTER — Inpatient Hospital Stay (HOSPITAL_COMMUNITY)
Admission: AD | Admit: 2016-07-11 | Discharge: 2016-07-11 | Disposition: A | Discharge status: Home / Self Care | Payer: Self-pay | Source: Ambulatory Visit | Attending: Obstetrics & Gynecology | Admitting: Obstetrics & Gynecology

## 2016-07-11 DIAGNOSIS — O99612 Diseases of the digestive system complicating pregnancy, second trimester: Secondary | ICD-10-CM | POA: Insufficient documentation

## 2016-07-11 DIAGNOSIS — O26892 Other specified pregnancy related conditions, second trimester: Secondary | ICD-10-CM | POA: Insufficient documentation

## 2016-07-11 DIAGNOSIS — Z3A08 8 weeks gestation of pregnancy: Secondary | ICD-10-CM | POA: Insufficient documentation

## 2016-07-11 DIAGNOSIS — O26899 Other specified pregnancy related conditions, unspecified trimester: Secondary | ICD-10-CM

## 2016-07-11 DIAGNOSIS — Z888 Allergy status to other drugs, medicaments and biological substances status: Secondary | ICD-10-CM | POA: Insufficient documentation

## 2016-07-11 DIAGNOSIS — O99342 Other mental disorders complicating pregnancy, second trimester: Secondary | ICD-10-CM | POA: Insufficient documentation

## 2016-07-11 DIAGNOSIS — F319 Bipolar disorder, unspecified: Secondary | ICD-10-CM | POA: Insufficient documentation

## 2016-07-11 DIAGNOSIS — R42 Dizziness and giddiness: Secondary | ICD-10-CM | POA: Insufficient documentation

## 2016-07-11 DIAGNOSIS — R109 Unspecified abdominal pain: Secondary | ICD-10-CM

## 2016-07-11 DIAGNOSIS — R110 Nausea: Secondary | ICD-10-CM | POA: Insufficient documentation

## 2016-07-11 DIAGNOSIS — R531 Weakness: Secondary | ICD-10-CM | POA: Insufficient documentation

## 2016-07-11 DIAGNOSIS — O34219 Maternal care for unspecified type scar from previous cesarean delivery: Secondary | ICD-10-CM | POA: Insufficient documentation

## 2016-07-11 DIAGNOSIS — Z809 Family history of malignant neoplasm, unspecified: Secondary | ICD-10-CM | POA: Insufficient documentation

## 2016-07-11 DIAGNOSIS — R0603 Acute respiratory distress: Secondary | ICD-10-CM | POA: Insufficient documentation

## 2016-07-11 DIAGNOSIS — O26891 Other specified pregnancy related conditions, first trimester: Secondary | ICD-10-CM | POA: Insufficient documentation

## 2016-07-11 DIAGNOSIS — Z9889 Other specified postprocedural states: Secondary | ICD-10-CM | POA: Insufficient documentation

## 2016-07-11 DIAGNOSIS — O9989 Other specified diseases and conditions complicating pregnancy, childbirth and the puerperium: Secondary | ICD-10-CM

## 2016-07-11 DIAGNOSIS — K5900 Constipation, unspecified: Secondary | ICD-10-CM | POA: Insufficient documentation

## 2016-07-11 DIAGNOSIS — O09299 Supervision of pregnancy with other poor reproductive or obstetric history, unspecified trimester: Secondary | ICD-10-CM

## 2016-07-11 DIAGNOSIS — R0602 Shortness of breath: Secondary | ICD-10-CM | POA: Insufficient documentation

## 2016-07-11 DIAGNOSIS — Z87891 Personal history of nicotine dependence: Secondary | ICD-10-CM | POA: Insufficient documentation

## 2016-07-11 LAB — WET PREP, GENITAL
Clue Cells Wet Prep HPF POC: NONE SEEN
SPERM: NONE SEEN
Trich, Wet Prep: NONE SEEN
Yeast Wet Prep HPF POC: NONE SEEN

## 2016-07-11 LAB — ABO/RH: ABO/RH(D): O POS

## 2016-07-11 LAB — CBC
HEMATOCRIT: 35.8 % — AB (ref 36.0–46.0)
Hemoglobin: 12.6 g/dL (ref 12.0–15.0)
MCH: 29.2 pg (ref 26.0–34.0)
MCHC: 35.2 g/dL (ref 30.0–36.0)
MCV: 82.9 fL (ref 78.0–100.0)
Platelets: 251 10*3/uL (ref 150–400)
RBC: 4.32 MIL/uL (ref 3.87–5.11)
RDW: 14.6 % (ref 11.5–15.5)
WBC: 10.1 10*3/uL (ref 4.0–10.5)

## 2016-07-11 LAB — URINALYSIS, ROUTINE W REFLEX MICROSCOPIC
Bilirubin Urine: NEGATIVE
GLUCOSE, UA: NEGATIVE mg/dL
HGB URINE DIPSTICK: NEGATIVE
Ketones, ur: NEGATIVE mg/dL
Leukocytes, UA: NEGATIVE
Nitrite: NEGATIVE
PROTEIN: NEGATIVE mg/dL
Specific Gravity, Urine: 1.013 (ref 1.005–1.030)
pH: 7 (ref 5.0–8.0)

## 2016-07-11 LAB — HCG, QUANTITATIVE, PREGNANCY: hCG, Beta Chain, Quant, S: 91600 m[IU]/mL — ABNORMAL HIGH (ref <5)

## 2016-07-11 LAB — POCT PREGNANCY, URINE: Preg Test, Ur: POSITIVE — AB

## 2016-07-11 MED ORDER — PROMETHAZINE HCL 12.5 MG PO TABS: 12.5000 mg | ORAL_TABLET | Freq: Four times a day (QID) | ORAL | 0 refills | Status: DC | PRN

## 2016-07-11 NOTE — Discharge Instructions (Signed)
Eating Plan for Hyperemesis Gravidarum °Hyperemesis gravidarum is a severe form of morning sickness. Because this condition causes severe nausea and vomiting, it can lead to dehydration, malnutrition, and weight loss. One way to lessen the symptoms of nausea and vomiting is to follow the eating plan for hyperemesis gravidarum. It is often used along with prescribed medicines to control your symptoms. °What can I do to relieve my symptoms? °Listen to your body. Everyone is different and has different preferences. Find what works best for you. Take any of the following actions that are helpful to you: °· Eat and drink slowly. °· Eat 5-6 small meals daily instead of 3 large meals. °· Eat crackers before you get out of bed in the morning. °· Try having a snack in the middle of the night. °· Starchy foods are usually tolerated well. Examples include cereal, toast, bread, potatoes, pasta, rice, and pretzels. °· Ginger may help with nausea. Add ¼ tsp ground ginger to hot tea or choose ginger tea. °· Try drinking 100% fruit juice or an electrolyte drink. An electrolyte drink contains sodium, potassium, and chloride. °· Continue to take your prenatal vitamins as told by your health care provider. If you are having trouble taking your prenatal vitamins, talk with your health care provider about different options. °· Include at least 1 serving of protein with your meals and snacks. Protein options include meats or poultry, beans, nuts, eggs, and yogurt. Try eating a protein-rich snack before bed. Examples of these snacks include cheese and crackers or half of a peanut butter or turkey sandwich. °· Consider eliminating foods that trigger your symptoms. These may include spicy foods, coffee, high-fat foods, very sweet foods, and acidic foods. °· Try meals that have more protein combined with bland, salty, lower-fat, and dry foods, such as nuts, seeds, pretzels, crackers, and cereal. °· Talk with your healthcare provider about  starting a supplement of vitamin B6. °· Have fluids that are cold, clear, and carbonated or sour. Examples include lemonade, ginger ale, lemon-lime soda, ice water, and sparkling water. °· Try lemon or mint tea. °· Try brushing your teeth or using a mouth rinse after meals. °What should I avoid to reduce my symptoms? °Avoiding some of the following things may help reduce your symptoms. °· Foods with strong smells. Try eating meals in well-ventilated areas that are free of odors. °· Drinking water or other beverages with meals. Try not to drink anything during the 30 minutes before and after your meals. °· Drinking more than 1 cup of fluid at a time. Sometimes using a straw helps. °· Fried or high-fat foods, such as butter and cream sauces. °· Spicy foods. °· Skipping meals as best as you can. Nausea can be more intense on an empty stomach. If you cannot tolerate food at that time, do not force it. Try sucking on ice chips or other frozen items, and make up for missed calories later. °· Lying down within 2 hours after eating. °· Environmental triggers. These may include smoky rooms, closed spaces, rooms with strong smells, warm or humid places, overly loud and noisy rooms, and rooms with motion or flickering lights. °· Quick and sudden changes in your movement. °This information is not intended to replace advice given to you by your health care provider. Make sure you discuss any questions you have with your health care provider. °Document Released: 12/06/2006 Document Revised: 10/08/2015 Document Reviewed: 09/09/2015 °Elsevier Interactive Patient Education © 2017 [employer] Inc[employer] ° °

## 2016-07-11 NOTE — MAU Note (Signed)
Pt reports she can't breathe well, she feels fatigued, and she gets dizzy. Also reports she has not had a BM for the last week and has lower abd pain.

## 2016-07-11 NOTE — MAU Provider Note (Signed)
History   Patient Jamie Hansen Is a [age] y.o.  G2P1001 At 5555w1d By certain LMP here with complaints of abdominal pain/constipation, shortness of breath,  dizziness and nausea. She has spent the last 4 months in [place]; [place] yesterday. She has not established prenatal care yet.    CSN: [id number]  Arrival date and time: 07/11/16 1733   First Provider Initiated Contact with Patient 07/11/16 1840      Chief Complaint  Patient presents with  . Constipation  . Respiratory Distress   Dizziness  This is a new problem. The current episode started 1 to 4 weeks ago. The problem occurs intermittently. The problem has been unchanged. Associated symptoms include abdominal pain, a change in bowel habit, fatigue, nausea and weakness. Pertinent negatives include no urinary symptoms, visual change or vomiting. Nothing aggravates the symptoms. She has tried nothing for the symptoms.  Patient says she feels tired, weak and dizzy. She has not been eating because food makes her nauseated, although she did eat rice and meat today. She has not been throwing up. She has sharp abdominal pains which she says is related to constipation as she has not had a bowel movement in 1 week.   OB History    Gravida Para Term Preterm AB Living   2 1 1     1    SAB TAB Ectopic Multiple Live Births           1      Past Medical History:  Diagnosis Date  . Bipolar 1 disorder (HCC)   . Chlamydia   . Depression   . Pregnancy induced hypertension     Past Surgical History:  Procedure Laterality Date  . CESAREAN SECTION    . HERNIA REPAIR      Family History  Problem Relation Age of Onset  . Cancer Maternal Grandmother     Social History  Substance Use Topics  . Smoking status: Former Games developermoker  . Smokeless tobacco: Never Used  . Alcohol use No    Allergies:  Allergies  Allergen Reactions  . Latex Itching and Other (See Comments)    Itching and yeast like  symptoms with latex condom use.    No prescriptions prior to admission.    Review of Systems  Constitutional: Positive for fatigue.  Gastrointestinal: Positive for abdominal pain, change in bowel habit, constipation and nausea. Negative for vomiting.  Genitourinary: Negative for vaginal bleeding and vaginal discharge.  Neurological: Positive for dizziness and weakness.   Physical Exam   Blood pressure 123/60, pulse 84, temperature 98.6 F (37 C), temperature source Oral, resp. rate 16, height 4\' 11"  (1.499 m), weight 116 lb (52.6 kg), last menstrual period 05/15/2016, SpO2 100 %.  Physical Exam  Constitutional: She is oriented to person, place, and time. She appears well-developed and well-nourished.  HENT:  Head: Normocephalic.  Neck: Normal range of motion.  Respiratory: Effort normal.  GI: Soft. She exhibits no distension and no mass. There is no tenderness. There is no rebound and no guarding.  Genitourinary: Vagina normal.  Genitourinary Comments: NEFG; no blood or discharge in the vaginal vault, cervix is pink with no lesions, no suprapubic tenderness, no adnexal tenderness.   Musculoskeletal: Normal range of motion.  Neurological: She is alert and oriented to person, place, and time. She has normal reflexes.  Skin: Skin is warm and dry.    MAU Course  Procedures  MDM ABO, betahcg, CBC -Transvaginal US shows SIUP  US Ob Comp Less 14 Wks  Result Date: 07/11/2016 CLINICAL DATA:  Left lower quadrant pain 1 week. Positive urine pregnancy test. Estimated gestational age per LMP 8 weeks 1 day. EXAM: OBSTETRIC <14 WK ULTRASOUND TECHNIQUE: Transabdominal ultrasound was performed for evaluation of the gestation as well as the maternal uterus and adnexal regions. COMPARISON:  None. FINDINGS: Intrauterine gestational sac: Single visualized. Yolk sac:  Visualized. Embryo:  Visualized. Cardiac Activity: Visualized. Heart Rate: 173 bpm CRL:   18.5  mm   8 w to d                  [place] EDC:  02/18/2017 Subchorionic hemorrhage:  None visualized. Maternal uterus/adnexae: Normal right ovary. Rounded hyperechoic left ovarian mass measuring 2.1 cm in diameter. No significant free pelvic fluid. IMPRESSION: Single live IUP with estimated gestational age 105 weeks 2 days. 2.1 cm rounded echogenic mass over the left ovary. 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 Recommend attention on follow-up ultrasound. Electronically Signed   By: Elberta Fortis M.D.   On: 07/11/2016 19:29   US Ob Transvaginal  Result Date: 07/11/2016 CLINICAL DATA:  Left lower quadrant pain 1 week. Positive urine pregnancy test. Estimated gestational age per LMP 8 weeks 1 day. EXAM: OBSTETRIC <14 WK ULTRASOUND TECHNIQUE: Transabdominal ultrasound was performed for evaluation of the gestation as well as the maternal uterus and adnexal regions. COMPARISON:  None. FINDINGS: Intrauterine gestational sac: Single visualized. Yolk sac:  Visualized. Embryo:  Visualized. Cardiac Activity: Visualized. Heart Rate: 173 bpm CRL:   18.5  mm   8 w to d                  [place] EDC: 02/18/2017 Subchorionic hemorrhage:  None visualized. Maternal uterus/adnexae: Normal right ovary. Rounded hyperechoic left ovarian mass measuring 2.1 cm in diameter. No significant free pelvic fluid. IMPRESSION: Single live IUP with estimated gestational age 105 weeks 2 days. 2.1 cm rounded echogenic mass over the left ovary. 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 Recommend attention on follow-up ultrasound. Electronically Signed   By: Elberta Fortis M.D.   On: 07/11/2016 19:29     Assessment and Plan   1. Constipation, unspecified constipation type   2. Abdominal pain affecting pregnancy    3. Patient stable for discharge. Patient plans to keep Prenatal visit at [facility] on 08-02-2016.  RX for phenergan sent to pharmacy on file.  4. Recommended increase water, fiber, miralax and colace for constipation. Reviewed when to return to the MAU (bleeding,  abdominal pain that does not go away after constipation has resolved) 5. Patient verbalized understanding; all questions answered.    Charlesetta Garibaldi Francile Woolford CNM 07/11/2016, 7:28 PM

## 2016-07-12 LAB — GC/CHLAMYDIA PROBE AMP (~~LOC~~) NOT AT ARMC
Chlamydia: NEGATIVE
Neisseria Gonorrhea: NEGATIVE

## 2016-08-06 ENCOUNTER — Other Ambulatory Visit (HOSPITAL_COMMUNITY): Payer: Self-pay | Admitting: Nurse Practitioner

## 2016-08-06 DIAGNOSIS — Z3682 Encounter for antenatal screening for nuchal translucency: Secondary | ICD-10-CM

## 2016-08-18 ENCOUNTER — Encounter (HOSPITAL_COMMUNITY): Payer: Self-pay

## 2016-08-18 ENCOUNTER — Other Ambulatory Visit (HOSPITAL_COMMUNITY): Payer: Self-pay | Admitting: Nurse Practitioner

## 2016-08-18 ENCOUNTER — Ambulatory Visit (HOSPITAL_COMMUNITY)
Admission: RE | Admit: 2016-08-18 | Discharge: 2016-08-18 | Disposition: A | Discharge status: Home / Self Care | Payer: Medicaid Other | Source: Ambulatory Visit | Attending: Nurse Practitioner | Admitting: Nurse Practitioner

## 2016-08-18 DIAGNOSIS — Z3682 Encounter for antenatal screening for nuchal translucency: Secondary | ICD-10-CM | POA: Diagnosis present

## 2016-08-18 DIAGNOSIS — O34219 Maternal care for unspecified type scar from previous cesarean delivery: Secondary | ICD-10-CM | POA: Insufficient documentation

## 2016-08-18 DIAGNOSIS — Z3A13 13 weeks gestation of pregnancy: Secondary | ICD-10-CM | POA: Insufficient documentation

## 2016-08-18 DIAGNOSIS — O09292 Supervision of pregnancy with other poor reproductive or obstetric history, second trimester: Secondary | ICD-10-CM | POA: Diagnosis not present

## 2016-08-18 DIAGNOSIS — Z98891 History of uterine scar from previous surgery: Secondary | ICD-10-CM

## 2016-08-24 ENCOUNTER — Other Ambulatory Visit: Payer: Self-pay

## 2017-02-08 ENCOUNTER — Inpatient Hospital Stay (HOSPITAL_COMMUNITY)
Admission: AD | Admit: 2017-02-08 | Discharge: 2017-02-08 | Disposition: A | Discharge status: Home / Self Care | Payer: Medicaid Other | Source: Ambulatory Visit | Attending: Family Medicine | Admitting: Family Medicine

## 2017-02-08 ENCOUNTER — Encounter (HOSPITAL_COMMUNITY): Payer: Self-pay

## 2017-02-08 DIAGNOSIS — O133 Gestational [pregnancy-induced] hypertension without significant proteinuria, third trimester: Secondary | ICD-10-CM

## 2017-02-08 DIAGNOSIS — Z3689 Encounter for other specified antenatal screening: Secondary | ICD-10-CM

## 2017-02-08 DIAGNOSIS — R030 Elevated blood-pressure reading, without diagnosis of hypertension: Secondary | ICD-10-CM | POA: Insufficient documentation

## 2017-02-08 DIAGNOSIS — Z3A38 38 weeks gestation of pregnancy: Secondary | ICD-10-CM

## 2017-02-08 DIAGNOSIS — O26893 Other specified pregnancy related conditions, third trimester: Secondary | ICD-10-CM | POA: Diagnosis not present

## 2017-02-08 DIAGNOSIS — O09299 Supervision of pregnancy with other poor reproductive or obstetric history, unspecified trimester: Secondary | ICD-10-CM

## 2017-02-08 DIAGNOSIS — Z87891 Personal history of nicotine dependence: Secondary | ICD-10-CM | POA: Diagnosis not present

## 2017-02-08 HISTORY — DX: Adverse effect of unspecified anesthetic, initial encounter: T41.45XA

## 2017-02-08 HISTORY — DX: Other complications of anesthesia, initial encounter: T88.59XA

## 2017-02-08 LAB — PROTEIN / CREATININE RATIO, URINE
Creatinine, Urine: 45 mg/dL
Total Protein, Urine: <6 mg/dL

## 2017-02-08 LAB — COMPREHENSIVE METABOLIC PANEL
ALT: 22 U/L (ref 14–54)
AST: 22 U/L (ref 15–41)
Albumin: 3.4 g/dL — ABNORMAL LOW (ref 3.5–5.0)
Alkaline Phosphatase: 167 U/L — ABNORMAL HIGH (ref 38–126)
Anion gap: 12 (ref 5–15)
BUN: 10 mg/dL (ref 6–20)
CO2: 19 mmol/L — ABNORMAL LOW (ref 22–32)
Calcium: 9.1 mg/dL (ref 8.9–10.3)
Chloride: 101 mmol/L (ref 101–111)
Creatinine, Ser: 0.5 mg/dL (ref 0.44–1.00)
GFR calc Af Amer: >60 mL/min (ref >60)
GFR calc non Af Amer: >60 mL/min (ref >60)
Glucose, Bld: 75 mg/dL (ref 65–99)
Potassium: 3.7 mmol/L (ref 3.5–5.1)
Sodium: 132 mmol/L — ABNORMAL LOW (ref 135–145)
Total Bilirubin: 1.5 mg/dL — ABNORMAL HIGH (ref 0.3–1.2)
Total Protein: 7.7 g/dL (ref 6.5–8.1)

## 2017-02-08 LAB — CBC
HCT: 37.7 % (ref 36.0–46.0)
Hemoglobin: 13.1 g/dL (ref 12.0–15.0)
MCH: 28.9 pg (ref 26.0–34.0)
MCHC: 34.7 g/dL (ref 30.0–36.0)
MCV: 83.2 fL (ref 78.0–100.0)
Platelets: 188 10*3/uL (ref 150–400)
RBC: 4.53 MIL/uL (ref 3.87–5.11)
RDW: 14 % (ref 11.5–15.5)
WBC: 10.1 10*3/uL (ref 4.0–10.5)

## 2017-02-08 LAB — URINALYSIS, ROUTINE W REFLEX MICROSCOPIC
Bilirubin Urine: NEGATIVE
Glucose, UA: NEGATIVE mg/dL
Hgb urine dipstick: NEGATIVE
Ketones, ur: NEGATIVE mg/dL
Leukocytes, UA: NEGATIVE
Nitrite: NEGATIVE
Protein, ur: NEGATIVE mg/dL
Specific Gravity, Urine: 1.009 (ref 1.005–1.030)
pH: 5 (ref 5.0–8.0)

## 2017-02-08 NOTE — MAU Note (Signed)
Urine in lab 

## 2017-02-08 NOTE — MAU Provider Note (Signed)
Chief Complaint:  Hypertension   First Provider Initiated Contact with Patient 02/08/17 1830      HPI: Jamie Hansen is a 21 y.o. G2P1001 at 94w3dwho presents to maternity admissions reporting elevated BP. She was seen in the office this morning around 0900 where her BP was elevated. Went to [employer] around 1430 and took her BP where it was noted to be 151/94. She denies being on any medication for BP and this being a chronic issue. She report dizziness and visual changes- seeing spots with BP elevation. She denies epigastric pain, headache or increased swelling. She reports a hx of preeclampsia with previous pregnancy with C/S- plans TOLAC.She reports good fetal movement, denies LOF, vaginal bleeding, vaginal itching/burning, urinary symptoms n/v, or fever/chills.     Past Medical History: Past Medical History:  Diagnosis Date  . Bipolar 1 disorder (HCC)   . Chlamydia   . Complication of anesthesia    low BP, drowsy  . Depression   . Pregnancy induced hypertension     Past obstetric history: OB History  Gravida Para Term Preterm AB Living  2 1 1     1   SAB TAB Ectopic Multiple Live Births          1    # Outcome Date GA Lbr Len/2nd Weight Sex Delivery Anes PTL Lv  2 Current           1 Term      CS-LTranv   LIV      Past Surgical History: Past Surgical History:  Procedure Laterality Date  . CESAREAN SECTION    . HERNIA REPAIR      Family History: Family History  Problem Relation Age of Onset  . Cancer Maternal Grandmother     Social History: Social History   Tobacco Use  . Smoking status: Former Games developer  . Smokeless tobacco: Never Used  Substance Use Topics  . Alcohol use: No  . Drug use: No    Allergies:  Allergies  Allergen Reactions  . Latex Itching and Other (See Comments)    Itching and yeast like symptoms with latex condom use.    Meds:  Medications Prior to Admission  Medication Sig Dispense Refill Last Dose  . promethazine (PHENERGAN)  12.5 MG tablet Take 1 tablet (12.5 mg total) by mouth every 6 (six) hours as needed for nausea or vomiting. 30 tablet 0 Taking    ROS:  Review of Systems  Constitutional: Negative.   Respiratory: Negative.   Cardiovascular: Negative.   Gastrointestinal: Negative.   Genitourinary: Negative.   Musculoskeletal: Negative.   Skin: Negative.   Neurological: Positive for dizziness. Negative for syncope, weakness, light-headedness and headaches.  Psychiatric/Behavioral: Negative.   Positive for visual changes-spots    I have reviewed patient's Past Medical Hx, Surgical Hx, Family Hx, Social Hx, medications and allergies.   Physical Exam   Patient Vitals for the past 24 hrs:  BP Temp Temp src Pulse Resp SpO2  02/08/17 2053 124/79 98.4 F (36.9 C) Oral 96 18 100 %  02/08/17 2030 122/79 - - 81 - 100 %  02/08/17 2000 125/80 - - 83 - 98 %  02/08/17 1945 120/76 - - 93 - 98 %  02/08/17 1930 126/77 - - 94 - 98 %  02/08/17 1915 122/71 - - 95 - 99 %  02/08/17 1901 119/78 - - 91 - 99 %  02/08/17 1845 126/87 - - 100 - 99 %  02/08/17 1830 131/87 - - 87 -  99 %  02/08/17 1811 133/64 98.6 F (37 C) Oral (!) 103 18 100 %   Constitutional: Well-developed, well-nourished female in no acute distress.  Cardiovascular: normal rate Respiratory: normal effort GI: Abd soft, non-tender, gravid appropriate for gestational age.  MS: Extremities nontender, no edema, normal ROM, DTR +1  Neurologic: Alert and oriented x 4.  GU: Neg CVAT.  Cervical exam:  Dilation: Closed Effacement (%): Thick Cervical Position: Posterior Exam by:: Lanice ShirtsV. Rogers, CNM Presentation: vertex by digital exam   FHT:  Baseline 140 , moderate variability, accelerations present, no decelerations Contractions: irreg mild contractions    Labs: Results for orders placed or performed during the hospital encounter of 02/08/17 (from the past 24 hour(s))  Protein / creatinine ratio, urine     Status: None   Collection Time: 02/08/17   6:00 PM  Result Value Ref Range   Creatinine, Urine 45.00 mg/dL   Total Protein, Urine <6.00 mg/dL   Protein Creatinine Ratio        0.00 - 0.15 mg/mg[Cre]  Urinalysis, Routine w reflex microscopic     Status: None   Collection Time: 02/08/17  6:00 PM  Result Value Ref Range   Color, Urine YELLOW YELLOW   APPearance CLEAR CLEAR   Specific Gravity, Urine 1.009 1.005 - 1.030   pH 5.0 5.0 - 8.0   Glucose, UA NEGATIVE NEGATIVE mg/dL   Hgb urine dipstick NEGATIVE NEGATIVE   Bilirubin Urine NEGATIVE NEGATIVE   Ketones, ur NEGATIVE NEGATIVE mg/dL   Protein, ur NEGATIVE NEGATIVE mg/dL   Nitrite NEGATIVE NEGATIVE   Leukocytes, UA NEGATIVE NEGATIVE  CBC     Status: None   Collection Time: 02/08/17  7:04 PM  Result Value Ref Range   WBC 10.1 4.0 - 10.5 K/uL   RBC 4.53 3.87 - 5.11 MIL/uL   Hemoglobin 13.1 12.0 - 15.0 g/dL   HCT 16.137.7 09.636.0 - 04.546.0 %   MCV 83.2 78.0 - 100.0 fL   MCH 28.9 26.0 - 34.0 pg   MCHC 34.7 30.0 - 36.0 g/dL   RDW 40.914.0 81.111.5 - 91.415.5 %   Platelets 188 150 - 400 K/uL  Comprehensive metabolic panel     Status: Abnormal   Collection Time: 02/08/17  7:04 PM  Result Value Ref Range   Sodium 132 (L) 135 - 145 mmol/L   Potassium 3.7 3.5 - 5.1 mmol/L   Chloride 101 101 - 111 mmol/L   CO2 19 (L) 22 - 32 mmol/L   Glucose, Bld 75 65 - 99 mg/dL   BUN 10 6 - 20 mg/dL   Creatinine, Ser 7.820.50 0.44 - 1.00 mg/dL   Calcium 9.1 8.9 - 95.610.3 mg/dL   Total Protein 7.7 6.5 - 8.1 g/dL   Albumin 3.4 (L) 3.5 - 5.0 g/dL   AST 22 15 - 41 U/L   ALT 22 14 - 54 U/L   Alkaline Phosphatase 167 (H) 38 - 126 U/L   Total Bilirubin 1.5 (H) 0.3 - 1.2 mg/dL   GFR calc non Af Amer >60 >60 mL/min   GFR calc Af Amer >60 >60 mL/min   Anion gap 12 5 - 15   --/--/O POS (05/20 1831)  MAU Course/MDM: Orders Placed This Encounter  Procedures  . CBC  . Comprehensive metabolic panel  . Protein / creatinine ratio, urine  . Urinalysis, Routine w reflex microscopic  . Discharge patient   NST  reviewed  Steward DroneVeronica Rogers Certified Nurse-Midwife 02/08/2017 9:00 PM  2005: Assumed care of pt,  labs pending Donette LarryBhambri, Dinorah Masullo, CNM     Labs and BPs reviewed. No evidence of HTN or preeclampsia. Stable for discharge.  A/P:  1. [redacted] weeks gestation of pregnancy   2. NST (non-stress test) reactive   3. History of pre-eclampsia in prior pregnancy, currently pregnant    [facility] Pre-e precautions Labor precautions  Allergies as of 02/08/2017      Reactions   Latex Itching, Other (See Comments)   Itching and yeast like symptoms with latex condom use.      Medication List    TAKE these medications   promethazine 12.5 MG tablet Commonly known as:  PHENERGAN Take 1 tablet (12.5 mg total) by mouth every 6 (six) hours as needed for nausea or vomiting.      Donette LarryBhambri, Chayna Surratt, CNM  02/08/2017 9:00 PM

## 2017-02-08 NOTE — Discharge Instructions (Signed)

## 2017-02-08 NOTE — MAU Note (Signed)
Pt presents with c/o elevated BP.  States was in office today had elevated BP, retook BP this evening in [facility] and BP remains elevated

## 2017-02-22 ENCOUNTER — Inpatient Hospital Stay (HOSPITAL_COMMUNITY): Payer: Medicaid Other | Admitting: Anesthesiology

## 2017-02-22 ENCOUNTER — Encounter (HOSPITAL_COMMUNITY): Payer: Self-pay

## 2017-02-22 ENCOUNTER — Inpatient Hospital Stay (HOSPITAL_COMMUNITY)
Admission: AD | Admit: 2017-02-22 | Discharge: 2017-02-25 | DRG: 798 | Disposition: A | Discharge status: Home / Self Care | Payer: Medicaid Other | Source: Ambulatory Visit | Attending: Family Medicine | Admitting: Family Medicine

## 2017-02-22 DIAGNOSIS — O09299 Supervision of pregnancy with other poor reproductive or obstetric history, unspecified trimester: Secondary | ICD-10-CM

## 2017-02-22 DIAGNOSIS — Z302 Encounter for sterilization: Secondary | ICD-10-CM | POA: Diagnosis not present

## 2017-02-22 DIAGNOSIS — Z87891 Personal history of nicotine dependence: Secondary | ICD-10-CM

## 2017-02-22 DIAGNOSIS — Z91040 Latex allergy status: Secondary | ICD-10-CM

## 2017-02-22 DIAGNOSIS — O4292 Full-term premature rupture of membranes, unspecified as to length of time between rupture and onset of labor: Principal | ICD-10-CM | POA: Diagnosis present

## 2017-02-22 DIAGNOSIS — Z3A40 40 weeks gestation of pregnancy: Secondary | ICD-10-CM | POA: Diagnosis not present

## 2017-02-22 DIAGNOSIS — O34211 Maternal care for low transverse scar from previous cesarean delivery: Secondary | ICD-10-CM | POA: Diagnosis present

## 2017-02-22 DIAGNOSIS — F419 Anxiety disorder, unspecified: Secondary | ICD-10-CM

## 2017-02-22 DIAGNOSIS — O99824 Streptococcus B carrier state complicating childbirth: Secondary | ICD-10-CM | POA: Diagnosis not present

## 2017-02-22 DIAGNOSIS — Z8619 Personal history of other infectious and parasitic diseases: Secondary | ICD-10-CM

## 2017-02-22 DIAGNOSIS — F319 Bipolar disorder, unspecified: Secondary | ICD-10-CM

## 2017-02-22 DIAGNOSIS — O34219 Maternal care for unspecified type scar from previous cesarean delivery: Secondary | ICD-10-CM | POA: Diagnosis not present

## 2017-02-22 LAB — CBC
HCT: 35.9 % — ABNORMAL LOW (ref 36.0–46.0)
HEMOGLOBIN: 12.7 g/dL (ref 12.0–15.0)
MCH: 28.6 pg (ref 26.0–34.0)
MCHC: 35.4 g/dL (ref 30.0–36.0)
MCV: 80.9 fL (ref 78.0–100.0)
Platelets: 216 10*3/uL (ref 150–400)
RBC: 4.44 MIL/uL (ref 3.87–5.11)
RDW: 14.3 % (ref 11.5–15.5)
WBC: 12.1 10*3/uL — AB (ref 4.0–10.5)

## 2017-02-22 LAB — COMPREHENSIVE METABOLIC PANEL
ALBUMIN: 3 g/dL — AB (ref 3.5–5.0)
ALK PHOS: 197 U/L — AB (ref 38–126)
ALT: 24 U/L (ref 14–54)
ANION GAP: 13 (ref 5–15)
AST: 27 U/L (ref 15–41)
BUN: 10 mg/dL (ref 6–20)
CALCIUM: 8.7 mg/dL — AB (ref 8.9–10.3)
CHLORIDE: 102 mmol/L (ref 101–111)
CO2: 18 mmol/L — AB (ref 22–32)
CREATININE: 0.68 mg/dL (ref 0.44–1.00)
GFR calc non Af Amer: >60 mL/min (ref >60)
GLUCOSE: 86 mg/dL (ref 65–99)
Potassium: 3.8 mmol/L (ref 3.5–5.1)
SODIUM: 133 mmol/L — AB (ref 135–145)
Total Bilirubin: 0.7 mg/dL (ref 0.3–1.2)
Total Protein: 6.8 g/dL (ref 6.5–8.1)

## 2017-02-22 LAB — TYPE AND SCREEN
ABO/RH(D): O POS
Antibody Screen: NEGATIVE

## 2017-02-22 LAB — POCT FERN TEST

## 2017-02-22 MED ORDER — LACTATED RINGERS IV SOLN
500.0000 mL | Freq: Once | INTRAVENOUS | Status: AC
  Administered 2017-02-22: 500 mL via INTRAVENOUS

## 2017-02-22 MED ORDER — OXYTOCIN BOLUS FROM INFUSION
500.0000 mL | Freq: Once | INTRAVENOUS | Status: AC
  Administered 2017-02-23: 500 mL via INTRAVENOUS

## 2017-02-22 MED ORDER — ACETAMINOPHEN 325 MG PO TABS: 650.0000 mg | ORAL_TABLET | ORAL | Status: DC | PRN

## 2017-02-22 MED ORDER — EPHEDRINE 5 MG/ML INJ
10.0000 mg | INTRAVENOUS | Status: DC | PRN
  Filled 2017-02-22: qty 2

## 2017-02-22 MED ORDER — PHENYLEPHRINE 40 MCG/ML (10ML) SYRINGE FOR IV PUSH (FOR BLOOD PRESSURE SUPPORT)
80.0000 ug | PREFILLED_SYRINGE | INTRAVENOUS | Status: DC | PRN
Start: 2017-02-22 — End: 2017-02-23
  Filled 2017-02-22: qty 5

## 2017-02-22 MED ORDER — OXYTOCIN 40 UNITS IN LACTATED RINGERS INFUSION - SIMPLE MED: 1.0000 m[IU]/min | INTRAVENOUS | Status: DC

## 2017-02-22 MED ORDER — DIPHENHYDRAMINE HCL 50 MG/ML IJ SOLN: 12.5000 mg | INTRAMUSCULAR | Status: DC | PRN

## 2017-02-22 MED ORDER — PENICILLIN G POTASSIUM 5000000 UNITS IJ SOLR
5.0000 10*6.[IU] | Freq: Once | INTRAVENOUS | Status: AC
  Administered 2017-02-22: 5 10*6.[IU] via INTRAVENOUS
  Filled 2017-02-22: qty 5

## 2017-02-22 MED ORDER — TERBUTALINE SULFATE 1 MG/ML IJ SOLN
0.2500 mg | Freq: Once | INTRAMUSCULAR | Status: DC | PRN
  Filled 2017-02-22: qty 1

## 2017-02-22 MED ORDER — OXYCODONE-ACETAMINOPHEN 5-325 MG PO TABS: 2.0000 | ORAL_TABLET | ORAL | Status: DC | PRN

## 2017-02-22 MED ORDER — LACTATED RINGERS IV SOLN
500.0000 mL | INTRAVENOUS | Status: DC | PRN
  Administered 2017-02-22: 500 mL via INTRAVENOUS
  Administered 2017-02-22: 1000 mL via INTRAVENOUS

## 2017-02-22 MED ORDER — OXYTOCIN 40 UNITS IN LACTATED RINGERS INFUSION - SIMPLE MED: 2.5000 [IU]/h | INTRAVENOUS | Status: DC

## 2017-02-22 MED ORDER — ONDANSETRON HCL 4 MG/2ML IJ SOLN
4.0000 mg | Freq: Four times a day (QID) | INTRAMUSCULAR | Status: DC | PRN
  Administered 2017-02-22 – 2017-02-23 (×2): 4 mg via INTRAVENOUS
  Filled 2017-02-22 (×2): qty 2

## 2017-02-22 MED ORDER — FENTANYL CITRATE (PF) 100 MCG/2ML IJ SOLN: 100.0000 ug | INTRAMUSCULAR | Status: DC | PRN

## 2017-02-22 MED ORDER — PENICILLIN G POT IN DEXTROSE 60000 UNIT/ML IV SOLN
3.0000 10*6.[IU] | INTRAVENOUS | Status: DC
  Administered 2017-02-22 – 2017-02-23 (×5): 3 10*6.[IU] via INTRAVENOUS
  Filled 2017-02-22 (×8): qty 50

## 2017-02-22 MED ORDER — LIDOCAINE HCL (PF) 1 % IJ SOLN
30.0000 mL | INTRAMUSCULAR | Status: AC | PRN
  Administered 2017-02-23: 30 mL via SUBCUTANEOUS
  Filled 2017-02-22: qty 30

## 2017-02-22 MED ORDER — SODIUM BICARBONATE 8.4 % IV SOLN
INTRAVENOUS | Status: DC | PRN
  Administered 2017-02-22: 5 mL via EPIDURAL

## 2017-02-22 MED ORDER — FENTANYL 2.5 MCG/ML BUPIVACAINE 1/10 % EPIDURAL INFUSION (WH - ANES)
14.0000 mL/h | INTRAMUSCULAR | Status: DC | PRN
  Administered 2017-02-22: 14 mL/h via EPIDURAL
  Administered 2017-02-22: 10 mL/h via EPIDURAL
  Administered 2017-02-22: 11.5 mL/h via EPIDURAL
  Filled 2017-02-22 (×3): qty 100

## 2017-02-22 MED ORDER — LIDOCAINE HCL (PF) 1 % IJ SOLN
INTRAMUSCULAR | Status: DC | PRN
  Administered 2017-02-22: 3 mL via EPIDURAL
  Administered 2017-02-22: 4 mL via EPIDURAL

## 2017-02-22 MED ORDER — OXYTOCIN 40 UNITS IN LACTATED RINGERS INFUSION - SIMPLE MED
1.0000 m[IU]/min | INTRAVENOUS | Status: DC
  Administered 2017-02-22: 1 m[IU]/min via INTRAVENOUS

## 2017-02-22 MED ORDER — SOD CITRATE-CITRIC ACID 500-334 MG/5ML PO SOLN
30.0000 mL | ORAL | Status: DC | PRN
  Administered 2017-02-23: 30 mL via ORAL
  Filled 2017-02-22 (×2): qty 15

## 2017-02-22 MED ORDER — OXYTOCIN 40 UNITS IN LACTATED RINGERS INFUSION - SIMPLE MED
1.0000 m[IU]/min | INTRAVENOUS | Status: DC
  Administered 2017-02-22: 2 m[IU]/min via INTRAVENOUS
  Filled 2017-02-22: qty 1000

## 2017-02-22 MED ORDER — PHENYLEPHRINE 40 MCG/ML (10ML) SYRINGE FOR IV PUSH (FOR BLOOD PRESSURE SUPPORT)
80.0000 ug | PREFILLED_SYRINGE | INTRAVENOUS | Status: DC | PRN
  Filled 2017-02-22: qty 5
  Filled 2017-02-22: qty 10

## 2017-02-22 MED ORDER — LACTATED RINGERS IV SOLN
INTRAVENOUS | Status: DC
  Administered 2017-02-22 (×6): via INTRAVENOUS

## 2017-02-22 MED ORDER — LACTATED RINGERS IV SOLN
INTRAVENOUS | Status: DC
  Administered 2017-02-22 – 2017-02-23 (×3): via INTRAUTERINE

## 2017-02-22 MED ORDER — OXYCODONE-ACETAMINOPHEN 5-325 MG PO TABS: 1.0000 | ORAL_TABLET | ORAL | Status: DC | PRN

## 2017-02-22 NOTE — H&P (Signed)
Jamie Hansen is a 22 y.o. female G2P1001 with IUP at [redacted]w[redacted]d presenting for SROM. Pt states she has been having irregular contractions, associated with none vaginal bleeding for no hours..  Membranes are intact, meconium light, with active fetal movement.  She has history of CS in [place], desires TOLAC. [facility] at [facility] since first trimester. Then transferred care to [facility]. Re-started pre-natal care at [facility] at 32 weeks.  Prenatal History/Complications:  Past Medical History: Past Medical History:  Diagnosis Date  . Bipolar 1 disorder (HCC)   . Chlamydia   . Complication of anesthesia    low BP, drowsy  . Depression   . Pregnancy induced hypertension     Past Surgical History: Past Surgical History:  Procedure Laterality Date  . CESAREAN SECTION    . HERNIA REPAIR      Obstetrical History: OB History    Gravida Para Term Preterm AB Living   2 1 1     1    SAB TAB Ectopic Multiple Live Births           1       Social History: Social History   Socioeconomic History  . Marital status: Divorced    Spouse name: None  . Number of children: None  . Years of education: None  . Highest education level: None  Social Needs  . Financial resource strain: None  . Food insecurity - worry: None  . Food insecurity - inability: None  . Transportation needs - medical: None  . Transportation needs - non-medical: None  Occupational History  . None  Tobacco Use  . Smoking status: Former Games developer  . Smokeless tobacco: Never Used  Substance and Sexual Activity  . Alcohol use: No  . Drug use: No  . Sexual activity: Yes    Partners: Female    Birth control/protection: None    Comment: 1ST INTERCOURSE- 14, PARTNERS- 3  Other Topics Concern  . None  Social History Narrative  . None    Family History: Family History  Problem Relation Age of Onset  . Cancer Maternal Grandmother     Allergies: Allergies  Allergen Reactions  . Latex Itching and Other (See  Comments)    Itching and yeast like symptoms with latex condom use.    Medications Prior to Admission  Medication Sig Dispense Refill Last Dose  . promethazine (PHENERGAN) 12.5 MG tablet Take 1 tablet (12.5 mg total) by mouth every 6 (six) hours as needed for nausea or vomiting. 30 tablet 0 Taking        Review of Systems   Constitutional: Negative for fever and chills Eyes: Negative for visual disturbances Respiratory: Negative for shortness of breath, dyspnea Cardiovascular: Negative for chest pain or palpitations  Gastrointestinal: Negative for vomiting, diarrhea and constipation.  POSITIVE for abdominal pain (contractions) Genitourinary: Negative for dysuria and urgency Musculoskeletal: Negative for back pain, joint pain, myalgias  Neurological: Negative for dizziness and headaches      Blood pressure 138/85, pulse 81, temperature 98.1 F (36.7 C), temperature source Oral, resp. rate 18, height 4\' 11"  (1.499 m), weight 136 lb (61.7 kg), last menstrual period 05/15/2016, SpO2 99 %. General appearance: alert, cooperative and no distress Lungs: clear to auscultation bilaterally Heart: regular rate and rhythm Abdomen: soft, non-tender; bowel sounds normal Extremities: Homans sign is negative, no sign of DVT DTR's 2+ Presentation: cephalic Fetal monitoring  Baseline: 130 bpm Uterine activity  q5 Dilation: 1.5 Effacement (%): 50 Station: -2 Exam by:: Bari Mantis  RN   Prenatal labs: ABO, Rh: --/--/O POS (05/20 1831) Antibody:   Rubella:   RPR:    HBsAg:    HIV: Non Reactive (01/26 2151)  GBS:    1 hr Glucola passed Genetic screening  negative Anatomy US negative  Prenatal Transfer Tool  Maternal Diabetes: No Genetic Screening: Normal Maternal Ultrasounds/Referrals: Normal Fetal Ultrasounds or other Referrals:  None Maternal Substance Abuse:  No Significant Maternal Medications:  None Significant Maternal Lab Results: None     Results for orders placed or  performed during the hospital encounter of 02/22/17 (from the past 24 hour(s))  Fern Test   Collection Time: 02/22/17  5:15 AM  Result Value Ref Range   POCT BoswellFern Test      Assessment: Jamie Hansen is a 22 y.o. G2P1001 with an IUP at 4369w3d presenting for SROM. She is for a TOLAC, op report not available but per records, scheduled at 37 weeks for pre-eclampsia. Signed TOLAC forms, available in media. Patient is Jehovah's witness.    -Plan for GBS treatment -Low dose pitocin -patient to re-sign TOLAC on Birthing Suites  Plan: #Labor: expectant management #Pain:  Per request #FWB Cat 1 #ID: GBS: positive #MOF:  Breast and formula #MOC: BTL #Circ: yes (outpatient)   Charlesetta GaribaldiKathryn Lorraine Kooistra 02/22/2017, 5:23 AM    Attestation of Attending Supervision of Advanced Practitioner (PA/CNM/NP): Evaluation and management procedures were performed by the Advanced Practitioner under my supervision and collaboration.  I have reviewed the Advanced Practitioner's note and chart, and I agree with the management and plan.  Baldemar LenisK. Meryl Benjy Kana, M.D. Attending Obstetrician & Gynecologist, [facility], [facility]  02/22/2017 8:02 AM

## 2017-02-22 NOTE — Progress Notes (Signed)
Patient ID: [name], female   DOB: November 16, 1995, 22 y.o.   MRN: [id number]  Having some abd discomfort w/ ctx; Pit started back about 2120 after FHR brady around 2000  BP 119/67, other VSS FHR 140s, 10x10accels, no further decels Ctx q 5 mins w/ Pit just increased to 474mu/min Cx deferred  IUP@term  TOLAC Early active labor- difficult to keep adequate ctx due to FHR tolerance  Will try to achieve adequate MVUs, then plan to check cx after 2 hrs  Cam HaiSHAW, Javier Mamone CNM 02/22/2017

## 2017-02-22 NOTE — Progress Notes (Signed)
RN called. IUPC not tracing well.  On exam IUPC had coiled. Removed IUPC and replaced with a new one. Contractions tracing well with new IUPC.  FHR: 135, moderate with 15x15 accels, with variable decelerations.  IUPC: q 2-4 mins.  Will start amnioinfusion.   Thressa ShellerHeather Hogan 4:38 PM 02/22/17

## 2017-02-22 NOTE — Anesthesia Procedure Notes (Signed)
Epidural Patient location during procedure: OB Start time: 02/22/2017 7:47 AM  Staffing Anesthesiologist: Mal AmabileFoster, Abhi Moccia, MD Performed: anesthesiologist   Preanesthetic Checklist Completed: patient identified, site marked, surgical consent, pre-op evaluation, timeout performed, IV checked, risks and benefits discussed and monitors and equipment checked  Epidural Patient position: sitting Prep: site prepped and draped and DuraPrep Patient monitoring: continuous pulse ox and blood pressure Approach: midline Location: L4-L5 Injection technique: LOR air  Needle:  Needle type: Tuohy  Needle gauge: 17 G Needle length: 9 cm and 9 Needle insertion depth: 4 cm Catheter type: closed end flexible Catheter size: 19 Gauge Catheter at skin depth: 9 cm Test dose: negative and Other  Assessment Events: blood not aspirated, injection not painful, no injection resistance, negative IV test and no paresthesia  Additional Notes Patient identified. Risks and benefits discussed including failed block, incomplete  Pain control, post dural puncture headache, nerve damage, paralysis, blood pressure Changes, nausea, vomiting, reactions to medications-both toxic and allergic and post Partum back pain. All questions were answered. Patient expressed understanding and wished to proceed. Sterile technique was used throughout procedure. Epidural site was Dressed with sterile barrier dressing. No paresthesias, signs of intravascular injection Or signs of intrathecal spread were encountered.  Patient was more comfortable after the epidural was dosed. Please see RN's note for documentation of vital signs and FHR which are stable.

## 2017-02-22 NOTE — Progress Notes (Signed)
Jamie BannisterMarieliz Hansen Hansen is a 22 y.o. G2P1001 at 6436w3d  admitted for rupture of membranes  Subjective:  No complaints, comfortable with epidural. BTL papers on the chart. Patient has not signed a TOLAC consent.  Objective: BP 119/66   Pulse 75   Temp 98.2 F (36.8 C) (Oral)   Resp 18   Ht 4\' 11"  (1.499 m)   Wt 136 lb (61.7 kg)   LMP 05/15/2016   SpO2 99%   BMI 27.47 kg/m  No intake/output data recorded. No intake/output data recorded.  FHT:  FHR: 130 bpm, variability: moderate,  accelerations:  Present,  decelerations:  Absent UC:   regular, every 2-3 minutes SVE:   Dilation: 1.5 Effacement (%): 50 Station: -2 Exam by:: Fabio NeighborsErin MacDonald RN   Labs: Lab Results  Component Value Date   WBC 12.1 (H) 02/22/2017   HGB 12.7 02/22/2017   HCT 35.9 (L) 02/22/2017   MCV 80.9 02/22/2017   PLT 216 02/22/2017    Assessment / Plan: PROM, on pitocin, contracting regularily.   Labor: Will continue to increase pitocin PRN  Preeclampsia:  NA Fetal Wellbeing:  Category I Pain Control:  Epidural I/D:  n/a Anticipated MOD:  NSVD  Thressa ShellerHeather Hogan 02/22/2017, 9:50 AM

## 2017-02-22 NOTE — Anesthesia Pain Management Evaluation Note (Signed)
  CRNA Pain Management Visit Note  Patient: Jamie Hansen, 22 y.o., female  "Hello I am a [facility] of the [facility] at [facility]. We have an anesthesia team available at all times to provide care throughout the hospital, including epidural management and anesthesia for C-section. I don't know your plan for the delivery whether it a natural birth, water birth, IV sedation, nitrous supplementation, doula or epidural, but we want to meet your pain goals."   1.Was your pain managed to your expectations on prior hospitalizations?   No   2.What is your expectation for pain management during this hospitalization?     Epidural  3.How can we help you reach that goal? Prior regional caused stat cs related to preeclampsia and bp drop with epidural dosing, this time would like for better management  Record the patient's initial score and the patient's pain goal.   Pain: 3  Pain Goal: 6 [facility] wants you to be able to say your pain was always managed very well.  Jamie Hansen,[name] 02/22/2017

## 2017-02-22 NOTE — MAU Note (Signed)
Reports contractions since yesterday morning; now 5 min apart; also lost mucous plug; felt some some fluid leaking recently - not sure what it is. Denies bleeding; + FM

## 2017-02-22 NOTE — L&D Delivery Note (Signed)
Operative Delivery Note At 3:16 AM a viable female was delivered via Vaginal, Vacuum Investment banker, operational(Extractor).  Presentation: vertex; Position: Occiput,, Posterior; Station: +2.  Verbal consent: obtained from patient.  Risks and benefits discussed in detail.  Risks include, but are not limited to the risks of anesthesia, bleeding, infection, damage to maternal tissues, fetal cephalhematoma.  There is also the risk of inability to effect vaginal delivery of the head, or shoulder dystocia that cannot be resolved by established maneuvers, leading to the need for emergency cesarean section.  I was called to patient's room due to deceleration of baby's heartrate. On my arrival, baby was OP at +2 station. Kiwi Vacuum extractor used and baby's head was rotated to OA. Over 4 contractions, vacuum was increased to green zone and steady and effective pulls were maintained and the pressure was discontinued at the end of the contractions. No pop-offs. After the delivery of the head, the vacuum was immediately discontinued and the remainder of the baby was delivered to discover a triple nuchal cord, which was reduced. Primary resuscitation immediately commenced on the mother's abdomen. After 20 seconds, cord clamped and cut and the baby was moved to the warmer for better resuscitation. Please see neonatologist's note for further resuscitation.  APGAR: 2, 4, 6; weight 5#14oz (1308M(2680g).   Placenta status: spontaneous, intact.   Cord: 3vc.  Cord pH: 7.24  Anesthesia:  epidural Instruments: correct Episiotomy: None Lacerations: 1st degree;Vaginal Suture Repair: 3.0 vicryl Est. Blood Loss (mL): 300  Mom to postpartum.  Baby to NICU.  Jamie Hansen 02/23/2017, 3:47 AM

## 2017-02-22 NOTE — Progress Notes (Signed)
Jamie BannisterMarieliz Velazquez Hansen is a 22 y.o. G2P1001 at [redacted]w[redacted]d  admitted for PROM  Subjective: No complaints. Comfortable with epidural.   Objective: BP (!) 129/92   Pulse 93   Temp 98.3 F (36.8 C) (Oral)   Resp 18   Ht 4\' 11"  (1.499 m)   Wt 136 lb (61.7 kg)   LMP 05/15/2016   SpO2 99%   BMI 27.47 kg/m  No intake/output data recorded. No intake/output data recorded.  FHT:  FHR: 145 bpm, variability: moderate,  accelerations:  Present,  decelerations:  Present variables, early  UC:   regular, every 2-3 minutes SVE:   Dilation: 4 Effacement (%): 90 Station: -1 Exam by:: Jamie Hansen, CNM  Labs: Lab Results  Component Value Date   WBC 12.1 (H) 02/22/2017   HGB 12.7 02/22/2017   HCT 35.9 (L) 02/22/2017   MCV 80.9 02/22/2017   PLT 216 02/22/2017    Assessment / Plan: Induction of labor due to PROM,  progressing well on pitocin  Labor: Progressing normally Preeclampsia:  NA Fetal Wellbeing:  Category II Pain Control:  Epidural I/D:  n/a Anticipated MOD:  NSVD   IUPC placed, will monitor progress, and consider amnioinfusion if variables continue.   Thressa ShellerHeather Hansen 02/22/2017, 3:10 PM

## 2017-02-22 NOTE — Anesthesia Preprocedure Evaluation (Addendum)
Anesthesia Evaluation  Patient identified by MRN, date of birth, ID band Patient awake    Reviewed: Allergy & Precautions, Patient's Chart, lab work & pertinent test results  History of Anesthesia Complications (+) history of anesthetic complications  Airway Mallampati: II  TM Distance: >3 FB Neck ROM: Full    Dental no notable dental hx. (+) Teeth Intact   Pulmonary former smoker,    Pulmonary exam normal breath sounds clear to auscultation       Cardiovascular hypertension, Normal cardiovascular exam Rhythm:Regular Rate:Normal     Neuro/Psych PSYCHIATRIC DISORDERS Anxiety Depression Bipolar Disorder negative neurological ROS     GI/Hepatic Neg liver ROS, GERD  ,  Endo/Other    Renal/GU negative Renal ROS  negative genitourinary   Musculoskeletal negative musculoskeletal ROS (+)   Abdominal   Peds  Hematology  (+) anemia ,   Anesthesia Other Findings   Reproductive/Obstetrics (+) Pregnancy Previous C/Section PIH Desires Sterilization                            Anesthesia Physical Anesthesia Plan  ASA: II  Anesthesia Plan: Epidural   Post-op Pain Management:    Induction:   PONV Risk Score and Plan:   Airway Management Planned: Natural Airway  Additional Equipment:   Intra-op Plan:   Post-operative Plan:   Informed Consent: I have reviewed the patients History and Physical, chart, labs and discussed the procedure including the risks, benefits and alternatives for the proposed anesthesia with the patient or authorized representative who has indicated his/her understanding and acceptance.     Plan Discussed with: Anesthesiologist  Anesthesia Plan Comments:         Anesthesia Quick Evaluation

## 2017-02-22 NOTE — Progress Notes (Signed)
Patient ID: [name], female   DOB: Oct 06, 1995, 22 y.o.   MRN: [id number]  CTSP for FHR late onset variables after past few ctx. Amnioinfusion going, O2 on, position changes. Cx 7-8/90/0. Pit decreased to 2mu from 4mu. FHR 140s after exam without variables. Will increase Pit back to 4mu after approx 30 mins. Continue laboring.  Cam HaiSHAW, Jamie Hansen CNM 02/22/2017 11:45 PM

## 2017-02-23 ENCOUNTER — Encounter (HOSPITAL_COMMUNITY)
Admission: AD | Disposition: A | Discharge status: Home / Self Care | Payer: Self-pay | Source: Ambulatory Visit | Attending: Family Medicine

## 2017-02-23 ENCOUNTER — Inpatient Hospital Stay (HOSPITAL_COMMUNITY): Payer: Medicaid Other | Admitting: Anesthesiology

## 2017-02-23 ENCOUNTER — Encounter (HOSPITAL_COMMUNITY): Payer: Self-pay | Admitting: Neonatal-Perinatal Medicine

## 2017-02-23 DIAGNOSIS — Z3A40 40 weeks gestation of pregnancy: Secondary | ICD-10-CM

## 2017-02-23 DIAGNOSIS — Z302 Encounter for sterilization: Secondary | ICD-10-CM

## 2017-02-23 DIAGNOSIS — O34219 Maternal care for unspecified type scar from previous cesarean delivery: Secondary | ICD-10-CM

## 2017-02-23 DIAGNOSIS — O99824 Streptococcus B carrier state complicating childbirth: Secondary | ICD-10-CM

## 2017-02-23 HISTORY — PX: TUBAL LIGATION: SHX77

## 2017-02-23 SURGERY — LIGATION, FALLOPIAN TUBE, POSTPARTUM
Anesthesia: Epidural | Site: Abdomen | Laterality: Bilateral | Wound class: Clean Contaminated

## 2017-02-23 MED ORDER — DEXAMETHASONE SODIUM PHOSPHATE 10 MG/ML IJ SOLN
INTRAMUSCULAR | Status: DC | PRN
  Administered 2017-02-23: 10 mg via INTRAVENOUS

## 2017-02-23 MED ORDER — ONDANSETRON HCL 4 MG/2ML IJ SOLN: 4.0000 mg | INTRAMUSCULAR | Status: DC | PRN

## 2017-02-23 MED ORDER — PRENATAL MULTIVITAMIN CH
1.0000 | ORAL_TABLET | Freq: Every day | ORAL | Status: DC
  Administered 2017-02-24 – 2017-02-25 (×2): 1 via ORAL
  Filled 2017-02-23 (×2): qty 1

## 2017-02-23 MED ORDER — PROMETHAZINE HCL 25 MG/ML IJ SOLN: 6.2500 mg | INTRAMUSCULAR | Status: DC | PRN

## 2017-02-23 MED ORDER — DEXAMETHASONE SODIUM PHOSPHATE 10 MG/ML IJ SOLN
INTRAMUSCULAR | Status: AC
  Filled 2017-02-23: qty 1

## 2017-02-23 MED ORDER — ONDANSETRON HCL 4 MG/2ML IJ SOLN
INTRAMUSCULAR | Status: AC
  Filled 2017-02-23: qty 2

## 2017-02-23 MED ORDER — OXYCODONE HCL 5 MG/5ML PO SOLN: 5.0000 mg | Freq: Once | ORAL | Status: DC | PRN

## 2017-02-23 MED ORDER — SCOPOLAMINE 1 MG/3DAYS TD PT72
MEDICATED_PATCH | TRANSDERMAL | Status: AC
  Filled 2017-02-23: qty 1

## 2017-02-23 MED ORDER — BUPIVACAINE HCL (PF) 0.25 % IJ SOLN
INTRAMUSCULAR | Status: DC | PRN
  Administered 2017-02-23: 30 mL

## 2017-02-23 MED ORDER — FENTANYL CITRATE (PF) 100 MCG/2ML IJ SOLN
INTRAMUSCULAR | Status: AC
  Filled 2017-02-23: qty 2

## 2017-02-23 MED ORDER — DIBUCAINE 1 % RE OINT: 1.0000 "application " | TOPICAL_OINTMENT | RECTAL | Status: DC | PRN

## 2017-02-23 MED ORDER — KETOROLAC TROMETHAMINE 30 MG/ML IJ SOLN
INTRAMUSCULAR | Status: DC | PRN
  Administered 2017-02-23: 30 mg via INTRAVENOUS

## 2017-02-23 MED ORDER — SCOPOLAMINE 1 MG/3DAYS TD PT72
MEDICATED_PATCH | TRANSDERMAL | Status: DC | PRN
  Administered 2017-02-23: 1 via TRANSDERMAL

## 2017-02-23 MED ORDER — ONDANSETRON HCL 4 MG PO TABS: 4.0000 mg | ORAL_TABLET | ORAL | Status: DC | PRN

## 2017-02-23 MED ORDER — HYDROMORPHONE HCL 1 MG/ML IJ SOLN: 0.2500 mg | INTRAMUSCULAR | Status: DC | PRN

## 2017-02-23 MED ORDER — HYDROMORPHONE HCL 1 MG/ML IJ SOLN
INTRAMUSCULAR | Status: DC
Start: 2017-02-23 — End: 2017-02-23
  Filled 2017-02-23: qty 1

## 2017-02-23 MED ORDER — WITCH HAZEL-GLYCERIN EX PADS: 1.0000 "application " | MEDICATED_PAD | CUTANEOUS | Status: DC | PRN

## 2017-02-23 MED ORDER — PROPOFOL 10 MG/ML IV BOLUS
INTRAVENOUS | Status: AC
  Filled 2017-02-23: qty 20

## 2017-02-23 MED ORDER — OXYCODONE HCL 5 MG PO TABS: 5.0000 mg | ORAL_TABLET | Freq: Once | ORAL | Status: DC | PRN

## 2017-02-23 MED ORDER — SENNOSIDES-DOCUSATE SODIUM 8.6-50 MG PO TABS
2.0000 | ORAL_TABLET | ORAL | Status: DC
  Administered 2017-02-24 – 2017-02-25 (×3): 2 via ORAL
  Filled 2017-02-23 (×3): qty 2

## 2017-02-23 MED ORDER — LIDOCAINE-EPINEPHRINE 2 %-1:100000 IJ SOLN
INTRAMUSCULAR | Status: DC | PRN
  Administered 2017-02-23: 60 mL via INTRADERMAL

## 2017-02-23 MED ORDER — SODIUM CHLORIDE 0.9 % IR SOLN
Status: DC | PRN
  Administered 2017-02-23: 1

## 2017-02-23 MED ORDER — COCONUT OIL OIL: 1.0000 "application " | TOPICAL_OIL | Status: DC | PRN

## 2017-02-23 MED ORDER — LACTATED RINGERS IV SOLN
INTRAVENOUS | Status: DC | PRN
  Administered 2017-02-23 (×2): via INTRAVENOUS

## 2017-02-23 MED ORDER — OXYCODONE HCL 5 MG PO TABS
5.0000 mg | ORAL_TABLET | ORAL | Status: DC | PRN
  Administered 2017-02-25: 5 mg via ORAL
  Filled 2017-02-23: qty 1

## 2017-02-23 MED ORDER — ZOLPIDEM TARTRATE 5 MG PO TABS: 5.0000 mg | ORAL_TABLET | Freq: Every evening | ORAL | Status: DC | PRN

## 2017-02-23 MED ORDER — LIDOCAINE-EPINEPHRINE (PF) 2 %-1:200000 IJ SOLN
INTRAMUSCULAR | Status: DC | PRN
  Administered 2017-02-23 (×4): 5 mL via INTRADERMAL

## 2017-02-23 MED ORDER — MIDAZOLAM HCL 2 MG/2ML IJ SOLN
INTRAMUSCULAR | Status: DC | PRN
  Administered 2017-02-23: 2 mg via INTRAVENOUS

## 2017-02-23 MED ORDER — FAMOTIDINE 20 MG PO TABS
40.0000 mg | ORAL_TABLET | Freq: Once | ORAL | Status: AC
  Administered 2017-02-23: 40 mg via ORAL
  Filled 2017-02-23: qty 2

## 2017-02-23 MED ORDER — SUCCINYLCHOLINE CHLORIDE 20 MG/ML IJ SOLN
INTRAMUSCULAR | Status: DC | PRN
  Administered 2017-02-23: 120 mg via INTRAVENOUS

## 2017-02-23 MED ORDER — SIMETHICONE 80 MG PO CHEW
80.0000 mg | CHEWABLE_TABLET | ORAL | Status: DC | PRN
  Filled 2017-02-23: qty 1

## 2017-02-23 MED ORDER — ACETAMINOPHEN 325 MG PO TABS
650.0000 mg | ORAL_TABLET | ORAL | Status: DC | PRN
  Administered 2017-02-24 – 2017-02-25 (×2): 650 mg via ORAL
  Filled 2017-02-23: qty 2

## 2017-02-23 MED ORDER — BUPIVACAINE HCL (PF) 0.25 % IJ SOLN
INTRAMUSCULAR | Status: AC
  Filled 2017-02-23: qty 30

## 2017-02-23 MED ORDER — PROPOFOL 10 MG/ML IV BOLUS
INTRAVENOUS | Status: DC | PRN
  Administered 2017-02-23: 150 mg via INTRAVENOUS

## 2017-02-23 MED ORDER — FENTANYL CITRATE (PF) 100 MCG/2ML IJ SOLN
INTRAMUSCULAR | Status: DC | PRN
  Administered 2017-02-23 (×2): 100 ug via INTRAVENOUS
  Administered 2017-02-23: 50 ug via INTRAVENOUS

## 2017-02-23 MED ORDER — METOCLOPRAMIDE HCL 10 MG PO TABS
10.0000 mg | ORAL_TABLET | Freq: Once | ORAL | Status: AC
  Administered 2017-02-23: 10 mg via ORAL
  Filled 2017-02-23: qty 1

## 2017-02-23 MED ORDER — DIPHENHYDRAMINE HCL 25 MG PO CAPS: 25.0000 mg | ORAL_CAPSULE | Freq: Four times a day (QID) | ORAL | Status: DC | PRN

## 2017-02-23 MED ORDER — IBUPROFEN 600 MG PO TABS
600.0000 mg | ORAL_TABLET | Freq: Four times a day (QID) | ORAL | Status: DC
  Administered 2017-02-23 – 2017-02-25 (×9): 600 mg via ORAL
  Filled 2017-02-23 (×9): qty 1

## 2017-02-23 MED ORDER — TETANUS-DIPHTH-ACELL PERTUSSIS 5-2.5-18.5 LF-MCG/0.5 IM SUSP: 0.5000 mL | Freq: Once | INTRAMUSCULAR | Status: DC

## 2017-02-23 MED ORDER — LACTATED RINGERS IV SOLN
INTRAVENOUS | Status: DC
  Administered 2017-02-23: 20 mL/h via INTRAVENOUS

## 2017-02-23 MED ORDER — MIDAZOLAM HCL 2 MG/2ML IJ SOLN
INTRAMUSCULAR | Status: AC
  Filled 2017-02-23: qty 2

## 2017-02-23 MED ORDER — BENZOCAINE-MENTHOL 20-0.5 % EX AERO: 1.0000 "application " | INHALATION_SPRAY | CUTANEOUS | Status: DC | PRN

## 2017-02-23 MED ORDER — LIDOCAINE HCL (CARDIAC) 20 MG/ML IV SOLN
INTRAVENOUS | Status: AC
  Filled 2017-02-23: qty 5

## 2017-02-23 SURGICAL SUPPLY — 23 items
BLADE SURG 11 STRL SS (BLADE) ×3 IMPLANT
CLIP FILSHIE TUBAL LIGA STRL (Clip) ×3 IMPLANT
CLOTH BEACON ORANGE TIMEOUT ST (SAFETY) ×3 IMPLANT
DRSG OPSITE POSTOP 3X4 (GAUZE/BANDAGES/DRESSINGS) ×3 IMPLANT
DURAPREP 26ML APPLICATOR (WOUND CARE) ×3 IMPLANT
GLOVE BIOGEL PI IND STRL 7.0 (GLOVE) ×1 IMPLANT
GLOVE BIOGEL PI IND STRL 7.5 (GLOVE) ×1 IMPLANT
GLOVE BIOGEL PI INDICATOR 7.0 (GLOVE) ×2
GLOVE BIOGEL PI INDICATOR 7.5 (GLOVE) ×2
GLOVE ECLIPSE 7.5 STRL STRAW (GLOVE) ×3 IMPLANT
GOWN STRL REUS W/TWL LRG LVL3 (GOWN DISPOSABLE) ×6 IMPLANT
NEEDLE HYPO 22GX1.5 SAFETY (NEEDLE) ×3 IMPLANT
NS IRRIG 1000ML POUR BTL (IV SOLUTION) ×3 IMPLANT
PACK ABDOMINAL MINOR (CUSTOM PROCEDURE TRAY) ×3 IMPLANT
PROTECTOR NERVE ULNAR (MISCELLANEOUS) ×3 IMPLANT
SPONGE LAP 4X18 X RAY DECT (DISPOSABLE) IMPLANT
SUT PLAIN 0 NONE (SUTURE) ×3 IMPLANT
SUT VICRYL 0 UR6 27IN ABS (SUTURE) ×3 IMPLANT
SUT VICRYL 4-0 PS2 18IN ABS (SUTURE) ×3 IMPLANT
SYR CONTROL 10ML LL (SYRINGE) ×3 IMPLANT
TOWEL OR 17X24 6PK STRL BLUE (TOWEL DISPOSABLE) ×6 IMPLANT
TRAY FOLEY CATH SILVER 14FR (SET/KITS/TRAYS/PACK) ×3 IMPLANT
WATER STERILE IRR 1000ML POUR (IV SOLUTION) ×3 IMPLANT

## 2017-02-23 NOTE — Anesthesia Postprocedure Evaluation (Signed)
Anesthesia Post Note  Patient: Jamie Hansen  Procedure(s) Performed: AN AD HOC LABOR EPIDURAL     Patient location during evaluation: [facility] Anesthesia Type: Epidural Level of consciousness: awake Pain management: satisfactory to patient Vital Signs Assessment: post-procedure vital signs reviewed and stable Respiratory status: spontaneous breathing Cardiovascular status: stable Anesthetic complications: no    Last Vitals:  Vitals:   02/23/17 1515 02/23/17 1531  BP: 120/83 128/85  Pulse: 68 85  Resp: 13 18  Temp:  36.8 C  SpO2: 98% 98%    Last Pain:  Vitals:   02/23/17 1535  TempSrc:   PainSc: 1    Pain Goal: Patients Stated Pain Goal: 2 (02/23/17 1535)               Cephus ShellingBURGER,Sender Rueb

## 2017-02-23 NOTE — Lactation Note (Signed)
This note was copied from a baby's chart. Lactation Consultation Note  Patient Name: [name] ZOXWR'UToday's Date: 02/23/2017 Reason for consult: Initial assessment;Infant < 6lbs;NICU baby   Initial consult with mom of term NICU infant. Mom denies need for Spanish interpreter and speaks English well. Mom reports she was not able to get her 89 yo to latch, she pumped but is unsure of how long.   Mom was taught to hand express, colostrum easily expressible from right breast. Did not attempt left breast at this time. Mom with semi compressible breasts and areola with short shaft everted nipples.   DEBP set up with instructions for use on Initiate setting, assembling, disassembling and cleaning of pump parts. Mom was pumping when LC left room.   Advised mom to pump every 2-3 hours for 15 minutes on Initiate setting with DEBP. Advised mom to hand express both breast post pumping. Enc mom to take all EBM to NICU to be used for infant. Mom is having surgery today at 412, enc her to resume pumping as soon as she is able after returning to room.   Providing Milk for Your Baby in NICU Booklet given and reviewed. Reviewed pumping schedule and what to expect with pumping and breast mil storage for the NICU infant.   BF Resources handout and LC Brochure given, mom informed of IP/OP Services, BF Support Groups and LC phone #.   [place] WIC representatives informed here at the hospital. [facility] [place] [facility] [place] [place] [facility] [place] as mom does not have a pump at home.   Mom reports she has no further questions/concerns at this time.    Maternal Data Formula Feeding for Exclusion: Yes Reason for exclusion: Mother's choice to formula and breast feed on admission Has patient been taught Hand Expression?: Yes Does the patient have breastfeeding experience prior to this delivery?: Yes  Feeding    LATCH Score                   Interventions    Lactation Tools Discussed/Used WIC Program: Yes Pump Review: Setup, frequency, and cleaning;Milk Storage Initiated by:: Noralee StainSharon Hice, RN, IBCLC   Consult Status Consult Status: Follow-up Date: 02/24/17 Follow-up type: In-patient    Silas FloodSharon S Hice 02/23/2017, 11:12 AM

## 2017-02-23 NOTE — Op Note (Signed)
Jamie BannisterMarieliz Velazquez Hansen 02/23/2017  PREOPERATIVE DIAGNOSIS:  Multiparity, undesired fertility  POSTOPERATIVE DIAGNOSIS:  Multiparity, undesired fertility  PROCEDURE:  Postpartum Bilateral Tubal Sterilization using Filshie Clips   SURGEON: Federico FlakeKimberly Niles Emory Gallentine MD ASSISTANT: Scheryl DarterJames Arnold MD  ANESTHESIA:  General  COMPLICATIONS:  None immediate.  ESTIMATED BLOOD LOSS:  Less than 20 ml.  FLUIDS: 1000 ml LR.  URINE OUTPUT:  150 ml of clear urine.  INDICATIONS: 22 y.o. [id number]  with undesired fertility,status post vaginal delivery, desires permanent sterilization. Risks and benefits of procedure discussed with patient including permanence of method, bleeding, infection, injury to surrounding organs and need for additional procedures. Risk failure of 0.5-1% with increased risk of ectopic gestation if pregnancy occurs was also discussed with patient.   FINDINGS:  Densely adhered uterus to the abdominal wall mostly over the lower uterine segment with continued adhesions bilaterally but left worse than right. Unable to reach fimbria on left due to dense lateral adhesions.  Sample of left tubal structure was obtained and sent to pathology.   LEFT- Modified Pomeroy RIGHT- Filshie Clip  TECHNIQUE:  The patient was taken to the operating room where her epidural anesthesia was found to be inadequate and she was put under general anesthesia. She was then placed in the dorsal supine position and prepped and draped in sterile fashion.  After an adequate timeout was performed, attention was turned to the patient's abdomen where a small transverse skin incision was made under the umbilical fold. The incision was taken down to the layer of fascia using the scalpel, and fascia was incised, and extended bilaterally using Mayo scissors. The peritoneum was entered in a sharp fashion. Attention was then turned to the patient's uterus, and left fallopian tube was identified and followed but fimbriated end was  unable to be identified. After lysis of adhesions and agreement with my assistant that we had grasped the fallopian tube, performed a modified Pomeroy using 2-0-Plain Gut on the left tube with a sample sent to pathology.  Attention was turned to the right tube, a Filshie clip was placed on the right fallopian tube about 2 cm from the cornual attachment, with care given to incorporate the underlying mesosalpinx.   Good hemostasis was noted overall.  LThe instruments were then removed from the patient's abdomen and the fascial incision was repaired with 0 Vicryl, and the skin was closed with a 3-0 Monocryl subcuticular stitch. The patient tolerated the procedure well.  Sponge, lap, and needle counts were correct times two.  The patient was then taken to the recovery room awake, extubated and in stable condition.

## 2017-02-23 NOTE — Progress Notes (Signed)
[name] 02/23/2017  Patient scheduled for bilateral tubal sterilization today. Spoke with patient, she declined use of an interpreter to myself and her RN. I reviewed in detail the risk of regret given patient's young age and relatively low parity. Additionally I reviewed in detail the permanent nature of a tubal sterilization. She voiced "I don't want anymore children." I reviewed the options of IUD and subdermal implant with the client and she said " I had a bad experience with the IUD and I am done having children."  Risks and benefits of procedure discussed with patient including permanence of method, bleeding, infection, injury to surrounding organs and need for additional procedures. Risk failure of 0.5-1% with increased risk of ectopic gestation if pregnancy occurs was also discussed with patient and she voiced understanding.   NPO and ready to proceed to OR when ready.   Federico FlakeKimberly Niles Detra Bores, MD, MPH, ABFM Attending Physician Faculty Practice- [facility]

## 2017-02-23 NOTE — Anesthesia Procedure Notes (Signed)
Procedure Name: Intubation Date/Time: 02/23/2017 12:55 PM Performed by: Genevie Ann, CRNA Pre-anesthesia Checklist: Timeout performed, Patient identified, Emergency Drugs available, Suction available and Patient being monitored Patient Re-evaluated:Patient Re-evaluated prior to induction Oxygen Delivery Method: Circle system utilized Preoxygenation: Pre-oxygenation with 100% oxygen Induction Type: IV induction Laryngoscope Size: Mac Grade View: Grade I Tube size: 7.0 mm Number of attempts: 1 Placement Confirmation: ETT inserted through vocal cords under direct vision,  positive ETCO2 and breath sounds checked- equal and bilateral Secured at: 22 cm Tube secured with: Tape Dental Injury: Teeth and Oropharynx as per pre-operative assessment

## 2017-02-23 NOTE — Lactation Note (Addendum)
This note was copied from a baby's chart. Lactation Consultation Note  Patient Name: Boy Rhett BannisterMarieliz Velazquez Ruiz Today's Date: 02/23/2017   Attempted to consult with mom to set up DEBP. Mom in the bathroom and RN getting her ready for surgery. RN indicated that mom would like to begin pumping. RN to call when mom available to have DEBP set up and start pumping. NICU Booklet Providing Milk for Your Baby in NICU, [facility] Brochure and pump set up left in room.      Maternal Data    Feeding    LATCH Score                   Interventions    Lactation Tools Discussed/Used     Consult Status      Silas FloodSharon S Donnae Michels 02/23/2017, 10:24 AM

## 2017-02-23 NOTE — Anesthesia Postprocedure Evaluation (Signed)
Anesthesia Post Note  Patient: Jamie BannisterMarieliz Velazquez Hansen  Procedure(s) Performed: POST PARTUM TUBAL LIGATION (Bilateral Abdomen)     Patient location during evaluation: PACU Anesthesia Type: Epidural Level of consciousness: awake and alert Pain management: pain level controlled Vital Signs Assessment: post-procedure vital signs reviewed and stable Respiratory status: spontaneous breathing, nonlabored ventilation and respiratory function stable Cardiovascular status: blood pressure returned to baseline and stable Postop Assessment: no apparent nausea or vomiting Anesthetic complications: no    Last Vitals:  Vitals:   02/23/17 1427 02/23/17 1430  BP: 127/78 125/84  Pulse: (!) 103 90  Resp: 12 14  Temp: 36.7 C   SpO2: 100% 100%    Last Pain:  Vitals:   02/23/17 1427  TempSrc: Oral  PainSc: 0-No pain   Pain Goal: Patients Stated Pain Goal: 2 (02/22/17 0609)               Lowella CurbWarren Ray Valkyrie Guardiola

## 2017-02-23 NOTE — Anesthesia Preprocedure Evaluation (Signed)
Anesthesia Evaluation  Patient identified by MRN, date of birth, ID band Patient awake    Reviewed: Allergy & Precautions, Patient's Chart, lab work & pertinent test results  History of Anesthesia Complications (+) history of anesthetic complications  Airway Mallampati: II  TM Distance: >3 FB Neck ROM: Full    Dental no notable dental hx. (+) Teeth Intact   Pulmonary former smoker,    Pulmonary exam normal breath sounds clear to auscultation       Cardiovascular hypertension, Normal cardiovascular exam Rhythm:Regular Rate:Normal     Neuro/Psych PSYCHIATRIC DISORDERS Anxiety Depression Bipolar Disorder negative neurological ROS     GI/Hepatic Neg liver ROS, GERD  ,  Endo/Other    Renal/GU negative Renal ROS  negative genitourinary   Musculoskeletal negative musculoskeletal ROS (+)   Abdominal   Peds  Hematology  (+) anemia ,   Anesthesia Other Findings   Reproductive/Obstetrics Previous C/Section PIH Desires Sterilization                             Anesthesia Physical  Anesthesia Plan  ASA: II  Anesthesia Plan: Epidural   Post-op Pain Management:    Induction:   PONV Risk Score and Plan: 2 and Ondansetron and Treatment may vary due to age or medical condition  Airway Management Planned: Natural Airway  Additional Equipment:   Intra-op Plan:   Post-operative Plan:   Informed Consent: I have reviewed the patients History and Physical, chart, labs and discussed the procedure including the risks, benefits and alternatives for the proposed anesthesia with the patient or authorized representative who has indicated his/her understanding and acceptance.     Plan Discussed with: Anesthesiologist  Anesthesia Plan Comments:         Anesthesia Quick Evaluation

## 2017-02-23 NOTE — Transfer of Care (Signed)
Immediate Anesthesia Transfer of Care Note  Patient: Jamie Hansen  Procedure(s) Performed: POST PARTUM TUBAL LIGATION (Bilateral Abdomen)  Patient Location: PACU  Anesthesia Type:General  Level of Consciousness: awake, alert  and oriented  Airway & Oxygen Therapy: Patient Spontanous Breathing  Post-op Assessment: Report given to RN and Post -op Vital signs reviewed and stable  Post vital signs: Reviewed and stable  Last Vitals:  Vitals:   02/23/17 1011 02/23/17 1200  BP: (!) 146/88 134/75  Pulse: 65 67  Resp: 18 18  Temp: 36.6 C 36.9 C  SpO2: 100% 100%    Last Pain:  Vitals:   02/23/17 1200  TempSrc: Oral  PainSc:       Patients Stated Pain Goal: 2 (02/22/17 [id number])  Complications: No apparent anesthesia complications

## 2017-02-24 ENCOUNTER — Encounter (HOSPITAL_COMMUNITY): Payer: Self-pay

## 2017-02-24 LAB — SYPHILIS: RPR W/REFLEX TO RPR TITER AND TREPONEMAL ANTIBODIES, TRADITIONAL SCREENING AND DIAGNOSIS ALGORITHM: RPR Ser Ql: NONREACTIVE

## 2017-02-24 NOTE — Progress Notes (Signed)
Post Partum Day 1 Subjective: no complaints, up ad lib, voiding and tolerating PO  Objective: Blood pressure 107/62, pulse 71, temperature 98.2 F (36.8 C), temperature source Rectal, resp. rate 18, height 4\' 11"  (1.499 m), weight 121 lb 4.8 oz (55 kg), last menstrual period 05/15/2016, SpO2 100 %, unknown if currently breastfeeding.  Physical Exam:  General: alert, cooperative and no distress Lochia: appropriate Uterine Fundus: firm Incision: n/a DVT Evaluation: No evidence of DVT seen on physical exam.  Recent Labs    02/22/17 0545  HGB 12.7  HCT 35.9*    Assessment/Plan: Plan for discharge tomorrow, Breastfeeding and Contraception BTL done  Baby in NICU   LOS: 2 days   Jamie Hansen 02/24/2017, 6:42 AM

## 2017-02-24 NOTE — Addendum Note (Signed)
Addendum  created 02/24/17 0804 by Shanon PayorGregory, Faithe Ariola M, CRNA   Charge Capture section accepted, Sign clinical note

## 2017-02-24 NOTE — Lactation Note (Signed)
This note was copied from a baby's chart. Lactation Consultation Note  Patient Name: Jamie [name] [id number]'s Date: 02/24/2017 Reason for consult: Follow-up assessment;NICU baby   Follow up with mom of 36 hour old NICU infant. Mom reports she is pumping and getting 5-10 cc with each pumping. Mom reports infant is eating well in the NICU.   [facility]. Mom reports no questions/concerns at this time.   Mom to call with any questions/concerns as needed.    Maternal Data Formula Feeding for Exclusion: Yes Reason for exclusion: Mother's choice to formula and breast feed on admission  Feeding Feeding Type: Formula Nipple Type: Slow - flow Length of feed: 20 min  LATCH Score                   Interventions    Lactation Tools Discussed/Used WIC Program: Yes Pump Review: Setup, frequency, and cleaning;Milk Storage Initiated by:: Reviewed and encouraged every 2-3 hours   Consult Status Consult Status: PRN Follow-up type: Call as needed    Ed BlalockSharon S Hice 02/24/2017, 4:13 PM

## 2017-02-24 NOTE — Addendum Note (Signed)
Addendum  created 02/24/17 0805 by Shanon PayorGregory, Laria Grimmett M, CRNA   Charge Capture section accepted, Sign clinical note

## 2017-02-24 NOTE — Anesthesia Postprocedure Evaluation (Signed)
Anesthesia Post Note  Patient: Jamie BannisterMarieliz Velazquez Ruiz  Procedure(s) Performed: AN AD HOC LABOR EPIDURAL     Patient location during evaluation: Mother Baby Anesthesia Type: Epidural Level of consciousness: awake and alert and oriented Pain management: satisfactory to patient Vital Signs Assessment: post-procedure vital signs reviewed and stable Respiratory status: spontaneous breathing and nonlabored ventilation Cardiovascular status: stable Postop Assessment: no headache, no backache, no signs of nausea or vomiting, adequate PO intake and patient able to bend at knees (patient up walking) Anesthetic complications: no    Last Vitals:  Vitals:   02/24/17 0030 02/24/17 0430  BP: 121/70 107/62  Pulse: 71 71  Resp: 18 18  Temp: 37 C 36.8 C  SpO2: 99% 100%    Last Pain:  Vitals:   02/24/17 0700  TempSrc:   PainSc: Asleep   Pain Goal: Patients Stated Pain Goal: 2 (02/23/17 1731)               Madison HickmanGREGORY,Kimika Streater

## 2017-02-24 NOTE — Progress Notes (Signed)
During routine 4-hour check of pt, she informed me that she had had a large and sudden gush of blood, saturating the regular overnight pad she was wearing and leaking onto her clothes about two hours prior. Pt had cleaned up so most of the blood was unable to be visualized. She denied having dizziness/lightheadness when she bled. Bleeding was an appropriate amount with check.  Instructed to call immediately if it happens again.

## 2017-02-24 NOTE — Anesthesia Postprocedure Evaluation (Signed)
Anesthesia Post Note  Patient: Jamie Hansen  Procedure(s) Performed: AN AD HOC LABOR EPIDURAL     Patient location during evaluation: Mother Baby Anesthesia Type: Epidural Level of consciousness: awake and alert and oriented Pain management: satisfactory to patient Vital Signs Assessment: post-procedure vital signs reviewed and stable Respiratory status: spontaneous breathing and nonlabored ventilation Cardiovascular status: stable Postop Assessment: no headache, no backache, no signs of nausea or vomiting, adequate PO intake and patient able to bend at knees (patient up walking) Anesthetic complications: no    Last Vitals:  Vitals:   02/24/17 0030 02/24/17 0430  BP: 121/70 107/62  Pulse: 71 71  Resp: 18 18  Temp: 37 C 36.8 C  SpO2: 99% 100%    Last Pain:  Vitals:   02/24/17 0700  TempSrc:   PainSc: Asleep   Pain Goal: Patients Stated Pain Goal: 2 (02/23/17 1731)               Khadar Monger     

## 2017-02-25 MED ORDER — IBUPROFEN 600 MG PO TABS: 600.0000 mg | ORAL_TABLET | Freq: Four times a day (QID) | ORAL | 0 refills | Status: DC

## 2017-02-25 NOTE — Discharge Instructions (Signed)
Vaginal Delivery, Care After °Refer to this sheet in the next few weeks. These instructions provide you with information about caring for yourself after vaginal delivery. Your health care provider may also give you more specific instructions. Your treatment has been planned according to current medical practices, but problems sometimes occur. Call your health care provider if you have any problems or questions. °What can I expect after the procedure? °After vaginal delivery, it is common to have: °· Some bleeding from your vagina. °· Soreness in your abdomen, your vagina, and the area of skin between your vaginal opening and your anus (perineum). °· Pelvic cramps. °· Fatigue. ° °Follow these instructions at home: °Medicines °· Take over-the-counter and prescription medicines only as told by your health care provider. °· If you were prescribed an antibiotic medicine, take it as told by your health care provider. Do not stop taking the antibiotic until it is finished. °Driving ° °· Do not drive or operate heavy machinery while taking prescription pain medicine. °· Do not drive for 24 hours if you received a sedative. °Lifestyle °· Do not drink alcohol. This is especially important if you are breastfeeding or taking medicine to relieve pain. °· Do not use tobacco products, including cigarettes, chewing tobacco, or e-cigarettes. If you need help quitting, ask your health care provider. °Eating and drinking °· Drink at least 8 eight-ounce glasses of water every day unless you are told not to by your health care provider. If you choose to breastfeed your baby, you may need to drink more water than this. °· Eat high-fiber foods every day. These foods may help prevent or relieve constipation. High-fiber foods include: °? Whole grain cereals and breads. °? Brown rice. °? Beans. °? Fresh fruits and vegetables. °Activity °· Return to your normal activities as told by your health care provider. Ask your health care provider  what activities are safe for you. °· Rest as much as possible. Try to rest or take a nap when your baby is sleeping. °· Do not lift anything that is heavier than your baby or 10 lb (4.5 kg) until your health care provider says that it is safe. °· Talk with your health care provider about when you can engage in sexual activity. This may depend on your: °? Risk of infection. °? Rate of healing. °? Comfort and desire to engage in sexual activity. °Vaginal Care °· If you have an episiotomy or a vaginal tear, check the area every day for signs of infection. Check for: °? More redness, swelling, or pain. °? More fluid or blood. °? Warmth. °? Pus or a bad smell. °· Do not use tampons or douches until your health care provider says this is safe. °· Watch for any blood clots that may pass from your vagina. These may look like clumps of dark red, brown, or black discharge. °General instructions °· Keep your perineum clean and dry as told by your health care provider. °· Wear loose, comfortable clothing. °· Wipe from front to back when you use the toilet. °· Ask your health care provider if you can shower or take a bath. If you had an episiotomy or a perineal tear during labor and delivery, your health care provider may tell you not to take baths for a certain length of time. °· Wear a bra that supports your breasts and fits you well. °· If possible, have someone help you with household activities and help care for your baby for at least a few days after   you leave the hospital. °· Keep all follow-up visits for you and your baby as told by your health care provider. This is important. °Contact a health care provider if: °· You have: °? Vaginal discharge that has a bad smell. °? Difficulty urinating. °? Pain when urinating. °? A sudden increase or decrease in the frequency of your bowel movements. °? More redness, swelling, or pain around your episiotomy or vaginal tear. °? More fluid or blood coming from your episiotomy or  vaginal tear. °? Pus or a bad smell coming from your episiotomy or vaginal tear. °? A fever. °? A rash. °? Little or no interest in activities you used to enjoy. °? Questions about caring for yourself or your baby. °· Your episiotomy or vaginal tear feels warm to the touch. °· Your episiotomy or vaginal tear is separating or does not appear to be healing. °· Your breasts are painful, hard, or turn red. °· You feel unusually sad or worried. °· You feel nauseous or you vomit. °· You pass large blood clots from your vagina. If you pass a blood clot from your vagina, save it to show to your health care provider. Do not flush blood clots down the toilet without having your health care provider look at them. °· You urinate more than usual. °· You are dizzy or light-headed. °· You have not breastfed at all and you have not had a menstrual period for 12 weeks after delivery. °· You have stopped breastfeeding and you have not had a menstrual period for 12 weeks after you stopped breastfeeding. °Get help right away if: °· You have: °? Pain that does not go away or does not get better with medicine. °? Chest pain. °? Difficulty breathing. °? Blurred vision or spots in your vision. °? Thoughts about hurting yourself or your baby. °· You develop pain in your abdomen or in one of your legs. °· You develop a severe headache. °· You faint. °· You bleed from your vagina so much that you fill two sanitary pads in one hour. °This information is not intended to replace advice given to you by your health care provider. Make sure you discuss any questions you have with your health care provider. °Document Released: 02/06/2000 Document Revised: 07/23/2015 Document Reviewed: 02/23/2015 °Elsevier Interactive Patient Education © 2018 [employer] ° °

## 2017-02-25 NOTE — Lactation Note (Signed)
This note was copied from a baby's chart. Lactation Consultation Note  Patient Name: Jamie [name] ZOXWR'UToday's Date: 02/25/2017 Reason for consult: Follow-up assessment;NICU baby  Pecola LeisureBaby is 7654 hours old LC visited mom after she had iced and pumped and 45 ml EBM yield noted.  LC reviewed sore nipple and engorgement prevention and tx.  [facility] mom [facility]  LC reviewed supply and demand and recommended to mom to  Not go all night without pumping to protect milk supply.  Idea to set alarm clock to wake up to pump.  Mother informed of post-discharge support and given phone number to the lactation department, including services for phone call assistance; out-patient appointments; and breastfeeding support group. List of other breastfeeding resources in the community given in the handout. Encouraged mother to call for problems or concerns related to breastfeeding.   Maternal Data    Feeding Feeding Type: Breast Milk Nipple Type: Slow - flow Length of feed: 15 min  LATCH Score                   Interventions Interventions: Breast feeding basics reviewed  Lactation Tools Discussed/Used WIC Program: Yes   Consult Status Consult Status: PRN Follow-up type: Other (comment)([facility] )    Jamie SprangMargaret Ann Shereka Hansen 02/25/2017, 9:35 AM

## 2017-02-25 NOTE — Discharge Summary (Signed)
OB Discharge Summary     Patient Name: Jamie BannisterMarieliz Velazquez Hansen DOB: 07-28-1995 MRN: [id number]  Date of admission: 02/22/2017 Delivering MD: Jamie HeritageSTINSON, JACOB Hansen   Date of discharge: 02/25/2017  Admitting diagnosis: 40 WEEKS LOST MUCUS PLUG CTX Intrauterine pregnancy: 309w4d     Secondary diagnosis:  Active Problems:   Indication for care in labor and delivery, antepartum   Encounter for sterilization  Additional problems: Nonreassuring FHR in labor, OP position     Discharge diagnosis: Term Pregnancy Delivered and VBAC                                                                                                Post partum procedures:none  Augmentation: AROM and Pitocin  Complications: None  Hospital course:  Onset of Labor With Vaginal Delivery     22 y.o. yo [id number] at [redacted]w[redacted]d was admitted in Latent Labor on 02/22/2017. Patient had an uncomplicated labor course as follows:  Membrane Rupture Time/Date: 12:30 AM ,02/22/2017   Intrapartum Procedures: Episiotomy: None [1]                                         Lacerations:  1st degree [2];Vaginal [6]  Patient had a delivery of a Viable infant. 02/23/2017  Information for the patient's newborn:  Jamie CarwinVelazquez Hansen, Boy Jamie Hansen [[id number]][[id number]]  Delivery Method: VBAC, Vacuum Assisted(Filed from Delivery Summary)    Pateint had an uncomplicated postpartum course.  She is ambulating, tolerating a regular diet, passing flatus, and urinating well. Patient is discharged home in stable condition on 02/25/17.   Physical exam  Vitals:   02/24/17 0805 02/24/17 1215 02/24/17 1714 02/25/17 0614  BP: 117/73 128/65 126/79   Pulse: 72 77 83   Resp: 16 18 18    Temp: 99.1 F (37.3 C) 99.3 F (37.4 C) 98.1 F (36.7 C)   TempSrc: Oral Oral Axillary   SpO2:   100%   Weight:    122 lb 11.2 oz (55.7 kg)  Height:       General: alert, cooperative and no distress Lochia: appropriate Uterine Fundus: firm Incision: Healing well with no significant  drainage, No significant erythema DVT Evaluation: No evidence of DVT seen on physical exam. Labs: Lab Results  Component Value Date   WBC 12.1 (H) 02/22/2017   HGB 12.7 02/22/2017   HCT 35.9 (L) 02/22/2017   MCV 80.9 02/22/2017   PLT 216 02/22/2017   CMP Latest Ref Rng & Units 02/22/2017  Glucose 65 - 99 mg/dL 86  BUN 6 - 20 mg/dL 10  Creatinine 2.840.44 - 1.321.00 mg/dL 4.400.68  Sodium 102135 - 725145 mmol/L 133(L)  Potassium 3.5 - 5.1 mmol/L 3.8  Chloride 101 - 111 mmol/L 102  CO2 22 - 32 mmol/L 18(L)  Calcium 8.9 - 10.3 mg/dL 3.6(U8.7(L)  Total Protein 6.5 - 8.1 g/dL 6.8  Total Bilirubin 0.3 - 1.2 mg/dL 0.7  Alkaline Phos 38 - 126 U/L 197(H)  AST 15 - 41 U/L 27  ALT 14 - 54  U/L 24    Discharge instruction: per After Visit Summary and "Baby and Me Booklet".  After visit meds:  Allergies as of 02/25/2017      Reactions   Latex Itching, Other (See Comments)   Itching and yeast like symptoms with latex condom use.      Medication List    TAKE these medications   ibuprofen 600 MG tablet Commonly known as:  ADVIL,MOTRIN Take 1 tablet (600 mg total) by mouth every 6 (six) hours.       Diet: routine diet  Activity: Advance as tolerated. Pelvic rest for 6 weeks.   Outpatient follow up:2 weeks Follow up Appt:No future appointments. Follow up Visit:No Follow-up on file.  Postpartum contraception: Tubal Ligation  Newborn Data: Live born female  Birth Weight: 5 lb 14.5 oz (2680 g) APGAR: 2, 4  Newborn Delivery   Birth date/time:  02/23/2017 03:16:00 Delivery type:  VBAC, Vacuum Assisted     Baby Feeding: Breast Disposition: NICU infant   02/25/2017 Jamie Hansen, CNM

## 2017-05-26 ENCOUNTER — Other Ambulatory Visit: Payer: Self-pay

## 2017-05-26 ENCOUNTER — Encounter (HOSPITAL_BASED_OUTPATIENT_CLINIC_OR_DEPARTMENT_OTHER): Payer: Self-pay | Admitting: *Deleted

## 2017-05-26 ENCOUNTER — Emergency Department (HOSPITAL_BASED_OUTPATIENT_CLINIC_OR_DEPARTMENT_OTHER)
Admission: EM | Admit: 2017-05-26 | Discharge: 2017-05-26 | Disposition: A | Discharge status: Home / Self Care | Payer: Medicaid Other | Attending: Emergency Medicine | Admitting: Emergency Medicine

## 2017-05-26 DIAGNOSIS — N61 Mastitis without abscess: Secondary | ICD-10-CM | POA: Insufficient documentation

## 2017-05-26 DIAGNOSIS — Z87891 Personal history of nicotine dependence: Secondary | ICD-10-CM | POA: Insufficient documentation

## 2017-05-26 DIAGNOSIS — Z91040 Latex allergy status: Secondary | ICD-10-CM | POA: Diagnosis not present

## 2017-05-26 DIAGNOSIS — Z79899 Other long term (current) drug therapy: Secondary | ICD-10-CM | POA: Insufficient documentation

## 2017-05-26 DIAGNOSIS — N644 Mastodynia: Secondary | ICD-10-CM | POA: Diagnosis present

## 2017-05-26 MED ORDER — CEPHALEXIN 500 MG PO CAPS: 500.0000 mg | ORAL_CAPSULE | Freq: Four times a day (QID) | ORAL | 0 refills | Status: DC

## 2017-05-26 MED ORDER — HYDROCODONE-ACETAMINOPHEN 5-325 MG PO TABS
1.0000 | ORAL_TABLET | Freq: Once | ORAL | Status: AC
Start: 2017-05-26 — End: 2017-05-26
  Administered 2017-05-26: 1 via ORAL
  Filled 2017-05-26: qty 1

## 2017-05-26 MED ORDER — CEPHALEXIN 250 MG PO CAPS
500.0000 mg | ORAL_CAPSULE | Freq: Once | ORAL | Status: AC
  Administered 2017-05-26: 500 mg via ORAL
  Filled 2017-05-26: qty 2

## 2017-05-26 NOTE — ED Triage Notes (Signed)
Right breast is red, sore and hot. She is breast feeding.

## 2017-05-26 NOTE — ED Provider Notes (Signed)
[facility] EMERGENCY DEPARTMENT Provider Note   CSN: [id number] Arrival date & time: 05/26/17  1743     History   Chief Complaint Chief Complaint  Patient presents with  . Breast Problem    HPI Jamie Hansen is a 22 y.o. female.  Patient is a 22 year old female with a history of depression, bipolar disease, pregnancy-induced hypertension who is 3 months postpartum and currently breast-feeding presenting with 5 hours of right sided breast pain.  After the pain started she noticed some mild redness in her right breast and general feelings of malaise with headache and some chills.  She also states that her child will not breast-feed off of the right side and she has not breast fed off that side for 5 hours.  He has had no issues like this since the birth of her child.  Currently the pain is a 10 out of 10 and radiates over to her right axilla.  It is sharp and stabbing in nature.  The history is provided by the patient.    Past Medical History:  Diagnosis Date  . Bipolar 1 disorder (HCC)   . Chlamydia   . Complication of anesthesia    low BP, drowsy  . Depression   . Pregnancy induced hypertension     Patient Active Problem List   Diagnosis Date Noted  . Encounter for sterilization 02/23/2017  . Indication for care in labor and delivery, antepartum 02/22/2017  . History of pre-eclampsia in prior pregnancy, currently pregnant 07/11/2016  . Bipolar disorder, unspecified (HCC) 11/14/2012  . Anxiety 11/14/2012  . History of chlamydia infection 11/14/2012    Past Surgical History:  Procedure Laterality Date  . CESAREAN SECTION    . HERNIA REPAIR    . TUBAL LIGATION Bilateral 02/23/2017   Procedure: POST PARTUM TUBAL LIGATION;  Surgeon: Federico Flake, MD;  Location: [facility] BIRTHING SUITES;  Service: Gynecology;  Laterality: Bilateral;     OB History    Gravida  2   Para  2   Term  2   Preterm      AB      Living  2     SAB      TAB     Ectopic      Multiple  0   Live Births  2            Home Medications    Prior to Admission medications   Medication Sig Start Date End Date Taking? Authorizing Provider  ibuprofen (ADVIL,MOTRIN) 600 MG tablet Take 1 tablet (600 mg total) by mouth every 6 (six) hours. 02/25/17   Aviva Signs, CNM    Family History Family History  Problem Relation Age of Onset  . Cancer Maternal Grandmother     Social History Social History   Tobacco Use  . Smoking status: Former Games developer  . Smokeless tobacco: Never Used  Substance Use Topics  . Alcohol use: No  . Drug use: No     Allergies   Latex   Review of Systems Review of Systems  All other systems reviewed and are negative.    Physical Exam Updated Vital Signs BP 126/81   Pulse (!) 117   Temp 99.9 F (37.7 C) (Oral)   Resp 20   Ht 4\' 11"  (1.499 m)   Wt 54.4 kg (120 lb)   SpO2 99%   BMI 24.24 kg/m   Physical Exam  Constitutional: She is oriented to person, place, and time. She  appears well-developed and well-nourished. No distress.  HENT:  Head: Normocephalic and atraumatic.  Mouth/Throat: Oropharynx is clear and moist.  Eyes: Pupils are equal, round, and reactive to light. Conjunctivae and EOM are normal.  Neck: Normal range of motion. Neck supple.  Cardiovascular: Regular rhythm and intact distal pulses. Tachycardia present.  No murmur heard. Pulmonary/Chest: Effort normal and breath sounds normal. No respiratory distress. She has no wheezes. She has no rales. Right breast exhibits tenderness. Right breast exhibits no nipple discharge and no skin change. There is breast swelling. No breast discharge or bleeding.    Musculoskeletal: Normal range of motion. She exhibits no edema or tenderness.  Neurological: She is alert and oriented to person, place, and time.  Skin: Skin is warm and dry. No rash noted. No erythema.  Psychiatric: She has a normal mood and affect. Her behavior is normal.  Nursing  note and vitals reviewed.    ED Treatments / Results  Labs (all labs ordered are listed, but only abnormal results are displayed) Labs Reviewed - No data to display  EKG None  Radiology No results found.  Procedures Procedures (including critical care time)  Medications Ordered in ED Medications  HYDROcodone-acetaminophen (NORCO/VICODIN) 5-325 MG per tablet 1 tablet (has no administration in time range)     Initial Impression / Assessment and Plan / ED Course  I have reviewed the triage vital signs and the nursing notes.  Pertinent labs & imaging results that were available during my care of the patient were reviewed by me and considered in my medical decision making (see chart for details).     Patient presenting today with evidence of mastitis.  Patient started on antibiotics and recommended that she either breast-feed or pump from that breast every 2 hours.  No evidence of abscess currently as the breast is soft and there is no induration or fluctuance present.    Final Clinical Impressions(s) / ED Diagnoses   Final diagnoses:  Mastitis    ED Discharge Orders        Ordered    cephALEXin (KEFLEX) 500 MG capsule  4 times daily     05/26/17 1811       Gwyneth SproutPlunkett, Kaili Castille, MD 05/26/17 1814

## 2017-05-26 NOTE — ED Notes (Signed)
ED Provider at bedside. 

## 2017-05-26 NOTE — Discharge Instructions (Addendum)
Make sure you are pumping or breast feeding from the right breast every 2 hours.  Use cold compresses and can continue ibuprofen 600mg 

## 2017-06-04 ENCOUNTER — Encounter (HOSPITAL_BASED_OUTPATIENT_CLINIC_OR_DEPARTMENT_OTHER): Payer: Self-pay | Admitting: Adult Health

## 2017-06-04 ENCOUNTER — Emergency Department (HOSPITAL_BASED_OUTPATIENT_CLINIC_OR_DEPARTMENT_OTHER): Payer: Medicaid Other

## 2017-06-04 ENCOUNTER — Emergency Department (HOSPITAL_BASED_OUTPATIENT_CLINIC_OR_DEPARTMENT_OTHER)
Admission: EM | Admit: 2017-06-04 | Discharge: 2017-06-04 | Disposition: A | Discharge status: Home / Self Care | Payer: Medicaid Other | Attending: Emergency Medicine | Admitting: Emergency Medicine

## 2017-06-04 ENCOUNTER — Other Ambulatory Visit: Payer: Self-pay

## 2017-06-04 DIAGNOSIS — Z91040 Latex allergy status: Secondary | ICD-10-CM | POA: Diagnosis not present

## 2017-06-04 DIAGNOSIS — G510 Bell's palsy: Secondary | ICD-10-CM | POA: Insufficient documentation

## 2017-06-04 DIAGNOSIS — Z87891 Personal history of nicotine dependence: Secondary | ICD-10-CM | POA: Diagnosis not present

## 2017-06-04 DIAGNOSIS — Z79899 Other long term (current) drug therapy: Secondary | ICD-10-CM | POA: Diagnosis not present

## 2017-06-04 DIAGNOSIS — R29810 Facial weakness: Secondary | ICD-10-CM | POA: Diagnosis present

## 2017-06-04 MED ORDER — PREDNISONE 10 MG PO TABS
60.0000 mg | ORAL_TABLET | Freq: Once | ORAL | Status: AC
  Administered 2017-06-04: 16:00:00 60 mg via ORAL
  Filled 2017-06-04: qty 1

## 2017-06-04 MED ORDER — PREDNISONE 10 MG (21) PO TBPK: ORAL_TABLET | ORAL | 0 refills | Status: DC

## 2017-06-04 MED ORDER — ARTIFICIAL TEARS OPHTHALMIC OINT
TOPICAL_OINTMENT | Freq: Once | OPHTHALMIC | Status: AC
  Administered 2017-06-04: 16:00:00 via OPHTHALMIC
  Filled 2017-06-04: qty 3.5

## 2017-06-04 MED ORDER — VALACYCLOVIR HCL 1 G PO TABS: 1000.0000 mg | ORAL_TABLET | Freq: Three times a day (TID) | ORAL | 0 refills | Status: DC

## 2017-06-04 MED ORDER — VALACYCLOVIR HCL 500 MG PO TABS
1000.0000 mg | ORAL_TABLET | Freq: Once | ORAL | Status: AC
  Administered 2017-06-04: 1000 mg via ORAL
  Filled 2017-06-04: qty 2

## 2017-06-04 NOTE — ED Provider Notes (Signed)
[facility] EMERGENCY DEPARTMENT Provider Note   CSN: [id number] Arrival date & time: 06/04/17  1538     History   Chief Complaint Chief Complaint  Patient presents with  . Facial Droop    HPI Jamie Hansen is a 22 y.o. female.  Pt presents to the ED today with a facial droop to the right side of her face that started yesterday.  It started with right sided headache and ear/shoulder pain.  She is unable to lift her right eyebrow or close her right eye.  No numbness/tingling arms or legs.  She was seen here on 4/4 for mastitis which is now gone.  She is still breastfeeding.     Past Medical History:  Diagnosis Date  . Bipolar 1 disorder (HCC)   . Chlamydia   . Complication of anesthesia    low BP, drowsy  . Depression   . Pregnancy induced hypertension     Patient Active Problem List   Diagnosis Date Noted  . Encounter for sterilization 02/23/2017  . Indication for care in labor and delivery, antepartum 02/22/2017  . History of pre-eclampsia in prior pregnancy, currently pregnant 07/11/2016  . Bipolar disorder, unspecified (HCC) 11/14/2012  . Anxiety 11/14/2012  . History of chlamydia infection 11/14/2012    Past Surgical History:  Procedure Laterality Date  . CESAREAN SECTION    . HERNIA REPAIR    . TUBAL LIGATION Bilateral 02/23/2017   Procedure: POST PARTUM TUBAL LIGATION;  Surgeon: Federico Flake, MD;  Location: [facility]IRTHING SUIT[facility];  Service: Gynecology;  Laterality: Bilateral;     OB History    Gravida  2   Para  2   Term  2   Preterm      AB      Living  2     SAB      TAB      Ectopic      Multiple  0   Live Births  2            Home Medications    Prior to Admission medications   Medication Sig Start Date End Date Taking? Authorizing Provider  cephALEXin (KEFLEX) 500 MG capsule Take 1 capsule (500 mg total) by mouth 4 (four) times daily. 05/26/17  Yes Gwyneth Sprout, MD  ibuprofen (ADVIL,MOTRIN)  600 MG tablet Take 1 tablet (600 mg total) by mouth every 6 (six) hours. 02/25/17   Aviva Signs, CNM  predniSONE (STERAPRED UNI-PAK 21 TAB) 10 MG (21) TBPK tablet Take 6 tabs by mouth daily  for 2 days, then 5 tabs for 2 days, then 4 tabs for 2 days, then 3 tabs for 2 days, 2 tabs for 2 days, then 1 tab by mouth daily for 2 days 06/04/17   Jacalyn Lefevre, MD  valACYclovir (VALTREX) 1000 MG tablet Take 1 tablet (1,000 mg total) by mouth 3 (three) times daily. 06/04/17   Jacalyn Lefevre, MD    Family History Family History  Problem Relation Age of Onset  . Cancer Maternal Grandmother     Social History Social History   Tobacco Use  . Smoking status: Former Games developer  . Smokeless tobacco: Never Used  Substance Use Topics  . Alcohol use: No  . Drug use: No     Allergies   Latex   Review of Systems Review of Systems  Neurological: Positive for facial asymmetry.       Right sided facial droop  All other systems reviewed and are negative.  Physical Exam Updated Vital Signs BP (!) 142/94   Pulse 99   Temp 98 F (36.7 C) (Oral)   Resp 18   SpO2 100%   Physical Exam  Constitutional: She is oriented to person, place, and time. She appears well-developed and well-nourished.  HENT:  Head: Atraumatic.  Right Ear: External ear normal.  Left Ear: External ear normal.  Nose: Nose normal.  Mouth/Throat: Oropharynx is clear and moist.  Right sided facial droop including forehead muscles.    No herpetic lesions on or in ear.  Eyes: Pupils are equal, round, and reactive to light. Conjunctivae and EOM are normal.  Right eye watering.  Right eyelid unable to completely close.  Neck: Normal range of motion. Neck supple.  Cardiovascular: Normal rate, regular rhythm, normal heart sounds and intact distal pulses.  Pulmonary/Chest: Effort normal and breath sounds normal.  Abdominal: Soft. Bowel sounds are normal.  Musculoskeletal: Normal range of motion.  Neurological: She is  alert and oriented to person, place, and time.  Skin: Skin is warm. Capillary refill takes less than 2 seconds.  Psychiatric: She has a normal mood and affect. Her behavior is normal. Judgment and thought content normal.  Nursing note and vitals reviewed.    ED Treatments / Results  Labs (all labs ordered are listed, but only abnormal results are displayed) Labs Reviewed - No data to display  EKG None  Radiology Ct Head Wo Contrast  Result Date: 06/04/2017 CLINICAL DATA:  Right facial numbness and severe headache. Right-sided facial droop EXAM: CT HEAD WITHOUT CONTRAST TECHNIQUE: Contiguous axial images were obtained from the base of the skull through the vertex without intravenous contrast. COMPARISON:  None. FINDINGS: Brain: The ventricles are normal in size and configuration. There is no intracranial mass, hemorrhage, extra-axial fluid collection, or midline shift. The gray-white compartments are normal. No evident acute infarct. Vascular: No hyperdense vessel.  No evident vascular calcification. Skull: The bony calvarium appears intact. Sinuses/Orbits: Visualized paranasal sinuses are clear. There is leftward deviation of the nasal septum. Orbits appear symmetric bilaterally. Other: Mastoid air cells are clear. IMPRESSION: Deviated nasal septum.  Study otherwise unremarkable. Electronically Signed   By: Bretta BangWilliam  Woodruff III M.D.   On: 06/04/2017 16:28    Procedures Procedures (including critical care time)  Medications Ordered in ED Medications  artificial tears (LACRILUBE) ophthalmic ointment ( Right Eye Given 06/04/17 1602)  valACYclovir (VALTREX) tablet 1,000 mg (1,000 mg Oral Given 06/04/17 1601)  predniSONE (DELTASONE) tablet 60 mg (60 mg Oral Given 06/04/17 1601)     Initial Impression / Assessment and Plan / ED Course  I have reviewed the triage vital signs and the nursing notes.  Pertinent labs & imaging results that were available during my care of the patient were  reviewed by me and considered in my medical decision making (see chart for details).    Pt encouraged to get otc refresh eye drops and tape eye shut at night.  She is instructed to establish primary care and return if worse.  Final Clinical Impressions(s) / ED Diagnoses   Final diagnoses:  Bell's palsy    ED Discharge Orders        Ordered    valACYclovir (VALTREX) 1000 MG tablet  3 times daily     06/04/17 1641    predniSONE (STERAPRED UNI-PAK 21 TAB) 10 MG (21) TBPK tablet     06/04/17 1641       Jacalyn LefevreHaviland, Elysse Polidore, MD 06/04/17 1644

## 2017-06-04 NOTE — Discharge Instructions (Signed)
OTC Refresh eye drops to right eye.

## 2017-06-04 NOTE — ED Triage Notes (Signed)
Presents 3 months post partum, breast-feeding child. YEsterday she began having right ear and shoulder pain and then was unable to move the rightside of her face. She reports that the right side of her face is numb and she has haeadacche as well. SHe can not lift her right eyebrow or close her right eye, she has right sided facial droop. SHe denies numbness and tingling of arms and legs.

## 2017-09-27 ENCOUNTER — Emergency Department (HOSPITAL_BASED_OUTPATIENT_CLINIC_OR_DEPARTMENT_OTHER)
Admission: EM | Admit: 2017-09-27 | Discharge: 2017-09-27 | Disposition: A | Discharge status: Home / Self Care | Payer: Medicaid Other | Attending: Emergency Medicine | Admitting: Emergency Medicine

## 2017-09-27 ENCOUNTER — Other Ambulatory Visit: Payer: Self-pay

## 2017-09-27 ENCOUNTER — Encounter (HOSPITAL_BASED_OUTPATIENT_CLINIC_OR_DEPARTMENT_OTHER): Payer: Self-pay

## 2017-09-27 DIAGNOSIS — B349 Viral infection, unspecified: Secondary | ICD-10-CM

## 2017-09-27 DIAGNOSIS — Z79899 Other long term (current) drug therapy: Secondary | ICD-10-CM | POA: Diagnosis not present

## 2017-09-27 DIAGNOSIS — Z87891 Personal history of nicotine dependence: Secondary | ICD-10-CM | POA: Diagnosis not present

## 2017-09-27 DIAGNOSIS — R112 Nausea with vomiting, unspecified: Secondary | ICD-10-CM | POA: Diagnosis present

## 2017-09-27 DIAGNOSIS — R197 Diarrhea, unspecified: Secondary | ICD-10-CM | POA: Diagnosis not present

## 2017-09-27 LAB — COMPREHENSIVE METABOLIC PANEL
ALBUMIN: 4.6 g/dL (ref 3.5–5.0)
ALT: 28 U/L (ref 0–44)
AST: 22 U/L (ref 15–41)
Alkaline Phosphatase: 115 U/L (ref 38–126)
Anion gap: 10 (ref 5–15)
BILIRUBIN TOTAL: 1.3 mg/dL — AB (ref 0.3–1.2)
BUN: 7 mg/dL (ref 6–20)
CALCIUM: 8.7 mg/dL — AB (ref 8.9–10.3)
CO2: 24 mmol/L (ref 22–32)
CREATININE: 0.65 mg/dL (ref 0.44–1.00)
Chloride: 103 mmol/L (ref 98–111)
GFR calc Af Amer: >60 mL/min (ref >60)
GFR calc non Af Amer: >60 mL/min (ref >60)
GLUCOSE: 103 mg/dL — AB (ref 70–99)
Potassium: 4.1 mmol/L (ref 3.5–5.1)
Sodium: 137 mmol/L (ref 135–145)
TOTAL PROTEIN: 7.9 g/dL (ref 6.5–8.1)

## 2017-09-27 LAB — CBC WITH DIFFERENTIAL/PLATELET
BASOS PCT: 0 %
Basophils Absolute: 0 10*3/uL (ref 0.0–0.1)
Eosinophils Absolute: 0 10*3/uL (ref 0.0–0.7)
Eosinophils Relative: 0 %
HEMATOCRIT: 37.7 % (ref 36.0–46.0)
HEMOGLOBIN: 13.3 g/dL (ref 12.0–15.0)
Lymphocytes Relative: 16 %
Lymphs Abs: 1.5 10*3/uL (ref 0.7–4.0)
MCH: 28.7 pg (ref 26.0–34.0)
MCHC: 35.3 g/dL (ref 30.0–36.0)
MCV: 81.3 fL (ref 78.0–100.0)
Monocytes Absolute: 0.7 10*3/uL (ref 0.1–1.0)
Monocytes Relative: 7 %
NEUTROS ABS: 7.3 10*3/uL (ref 1.7–7.7)
Neutrophils Relative %: 77 %
Platelets: 210 10*3/uL (ref 150–400)
RBC: 4.64 MIL/uL (ref 3.87–5.11)
RDW: 13.6 % (ref 11.5–15.5)
WBC: 9.5 10*3/uL (ref 4.0–10.5)

## 2017-09-27 LAB — URINALYSIS, ROUTINE W REFLEX MICROSCOPIC
BILIRUBIN URINE: NEGATIVE
GLUCOSE, UA: NEGATIVE mg/dL
Hgb urine dipstick: NEGATIVE
KETONES UR: NEGATIVE mg/dL
Leukocytes, UA: NEGATIVE
Nitrite: NEGATIVE
PH: 6 (ref 5.0–8.0)
Protein, ur: NEGATIVE mg/dL
Specific Gravity, Urine: <1.005 — ABNORMAL LOW (ref 1.005–1.030)

## 2017-09-27 LAB — GROUP A STREP BY PCR: Group A Strep by PCR: NOT DETECTED

## 2017-09-27 LAB — PREGNANCY, URINE: Preg Test, Ur: NEGATIVE

## 2017-09-27 LAB — LIPASE, BLOOD: Lipase: 32 U/L (ref 11–51)

## 2017-09-27 LAB — MONONUCLEOSIS SCREEN: Mono Screen: NEGATIVE

## 2017-09-27 MED ORDER — DIPHENHYDRAMINE HCL 50 MG/ML IJ SOLN
12.5000 mg | Freq: Once | INTRAMUSCULAR | Status: AC
Start: 2017-09-27 — End: 2017-09-27
  Administered 2017-09-27: 12.5 mg via INTRAVENOUS
  Filled 2017-09-27: qty 1

## 2017-09-27 MED ORDER — KETOROLAC TROMETHAMINE 30 MG/ML IJ SOLN
30.0000 mg | Freq: Once | INTRAMUSCULAR | Status: AC
  Administered 2017-09-27: 30 mg via INTRAVENOUS
  Filled 2017-09-27: qty 1

## 2017-09-27 MED ORDER — METOCLOPRAMIDE HCL 5 MG/ML IJ SOLN
10.0000 mg | Freq: Once | INTRAMUSCULAR | Status: AC
  Administered 2017-09-27: 10 mg via INTRAVENOUS
  Filled 2017-09-27: qty 2

## 2017-09-27 MED ORDER — ACETAMINOPHEN 500 MG PO TABS
1000.0000 mg | ORAL_TABLET | Freq: Once | ORAL | Status: AC
  Administered 2017-09-27: 1000 mg via ORAL
  Filled 2017-09-27: qty 2

## 2017-09-27 MED ORDER — ONDANSETRON 4 MG PO TBDP: ORAL_TABLET | ORAL | 0 refills | Status: DC

## 2017-09-27 MED ORDER — ONDANSETRON HCL 4 MG/2ML IJ SOLN
4.0000 mg | Freq: Once | INTRAMUSCULAR | Status: AC
  Administered 2017-09-27: 4 mg via INTRAVENOUS
  Filled 2017-09-27: qty 2

## 2017-09-27 MED ORDER — SODIUM CHLORIDE 0.9 % IV BOLUS
1000.0000 mL | Freq: Once | INTRAVENOUS | Status: AC
Start: 2017-09-27 — End: 2017-09-27
  Administered 2017-09-27: 1000 mL via INTRAVENOUS

## 2017-09-27 NOTE — ED Provider Notes (Signed)
[facility] EMERGENCY DEPARTMENT Provider Note   CSN: [id number] Arrival date & time: 09/27/17  1956     History   Chief Complaint Chief Complaint  Patient presents with  . Generalized Body Aches    HPI Ceriah Kohler is a 22 y.o. female hx of bipolar, depression, here presenting nausea, fevers, abdominal pain, vomiting, or throat, headaches.  Patient states that she has been having body aches and headaches since yesterday.  Also has some sore throat and vomiting and diarrhea today.  Patient has some chills but did not take her temperature at home.  Denies any sick contacts.  The history is provided by the patient.    Past Medical History:  Diagnosis Date  . Bipolar 1 disorder (HCC)   . Chlamydia   . Complication of anesthesia    low BP, drowsy  . Depression   . Pregnancy induced hypertension     Patient Active Problem List   Diagnosis Date Noted  . Encounter for sterilization 02/23/2017  . Indication for care in labor and delivery, antepartum 02/22/2017  . History of pre-eclampsia in prior pregnancy, currently pregnant 07/11/2016  . Bipolar disorder, unspecified (HCC) 11/14/2012  . Anxiety 11/14/2012  . History of chlamydia infection 11/14/2012    Past Surgical History:  Procedure Laterality Date  . CESAREAN SECTION    . HERNIA REPAIR    . TUBAL LIGATION Bilateral 02/23/2017   Procedure: POST PARTUM TUBAL LIGATION;  Surgeon: Federico Flake, MD;  Location: [facility] BIRTHING SUITES;  Service: Gynecology;  Laterality: Bilateral;     OB History    Gravida  2   Para  2   Term  2   Preterm      AB      Living  2     SAB      TAB      Ectopic      Multiple  0   Live Births  2            Home Medications    Prior to Admission medications   Medication Sig Start Date End Date Taking? Authorizing Provider  cephALEXin (KEFLEX) 500 MG capsule Take 1 capsule (500 mg total) by mouth 4 (four) times daily. 05/26/17   Gwyneth Sprout, MD  ibuprofen (ADVIL,MOTRIN) 600 MG tablet Take 1 tablet (600 mg total) by mouth every 6 (six) hours. 02/25/17   Aviva Signs, CNM  predniSONE (STERAPRED UNI-PAK 21 TAB) 10 MG (21) TBPK tablet Take 6 tabs by mouth daily  for 2 days, then 5 tabs for 2 days, then 4 tabs for 2 days, then 3 tabs for 2 days, 2 tabs for 2 days, then 1 tab by mouth daily for 2 days 06/04/17   Jacalyn Lefevre, MD  valACYclovir (VALTREX) 1000 MG tablet Take 1 tablet (1,000 mg total) by mouth 3 (three) times daily. 06/04/17   Jacalyn Lefevre, MD    Family History Family History  Problem Relation Age of Onset  . Cancer Maternal Grandmother     Social History Social History   Tobacco Use  . Smoking status: Former Games developer  . Smokeless tobacco: Never Used  Substance Use Topics  . Alcohol use: Yes    Comment: occ  . Drug use: No     Allergies   Latex   Review of Systems Review of Systems  Constitutional: Positive for chills.  Gastrointestinal: Positive for diarrhea and vomiting.  Neurological: Positive for headaches.  All other systems reviewed and  are negative.    Physical Exam Updated Vital Signs BP 122/77 (BP Location: Right Arm)   Pulse (!) 107   Temp 100 F (37.8 C) (Oral)   Resp 18   Ht 4\' 11"  (1.499 m)   Wt 53.5 kg (118 lb)   LMP 04/05/2017   SpO2 99%   Breastfeeding? Yes   BMI 23.83 kg/m   Physical Exam  Constitutional: She is oriented to person, place, and time. She appears well-developed.  Slightly uncomfortable   HENT:  Head: Normocephalic.  MM dry. Posterior pharynx slightly red, no tonsillar exudates   Eyes: Pupils are equal, round, and reactive to light. Conjunctivae and EOM are normal.  Neck: Normal range of motion. Neck supple.  Cardiovascular: Regular rhythm and normal heart sounds.  Tachycardic   Pulmonary/Chest: Effort normal and breath sounds normal. No stridor. No respiratory distress.  Abdominal: Soft. Bowel sounds are normal. She exhibits no distension.  There is no tenderness. There is no guarding.  Musculoskeletal: Normal range of motion.  Neurological: She is alert and oriented to person, place, and time. No cranial nerve deficit. Coordination normal.  Skin: Skin is warm. Capillary refill takes less than 2 seconds.  Psychiatric: She has a normal mood and affect.  Nursing note and vitals reviewed.    ED Treatments / Results  Labs (all labs ordered are listed, but only abnormal results are displayed) Labs Reviewed  COMPREHENSIVE METABOLIC PANEL - Abnormal; Notable for the following components:      Result Value   Glucose, Bld 103 (*)    Calcium 8.7 (*)    Total Bilirubin 1.3 (*)    All other components within normal limits  URINALYSIS, ROUTINE W REFLEX MICROSCOPIC - Abnormal; Notable for the following components:   Specific Gravity, Urine <1.005 (*)    All other components within normal limits  GROUP A STREP BY PCR  CBC WITH DIFFERENTIAL/PLATELET  PREGNANCY, URINE  LIPASE, BLOOD  MONONUCLEOSIS SCREEN    EKG None  Radiology No results found.  Procedures Procedures (including critical care time)  Medications Ordered in ED Medications  sodium chloride 0.9 % bolus 1,000 mL ( Intravenous Stopped 09/27/17 2134)  acetaminophen (TYLENOL) tablet 1,000 mg (1,000 mg Oral Given 09/27/17 2038)  ondansetron (ZOFRAN) injection 4 mg (4 mg Intravenous Given 09/27/17 2038)  ketorolac (TORADOL) 30 MG/ML injection 30 mg (30 mg Intravenous Given 09/27/17 2134)  metoCLOPramide (REGLAN) injection 10 mg (10 mg Intravenous Given 09/27/17 2136)  diphenhydrAMINE (BENADRYL) injection 12.5 mg (12.5 mg Intravenous Given 09/27/17 2139)     Initial Impression / Assessment and Plan / ED Course  I have reviewed the triage vital signs and the nursing notes.  Pertinent labs & imaging results that were available during my care of the patient were reviewed by me and considered in my medical decision making (see chart for details).     Susanna Benge  is a 22 y.o. female here with headaches, fever, vomiting, diarrhea. Likely viral gastro vs mono vs strep. No meningeal signs to suggest meningitis. No cough or suggest pneumonia. Febrile in the ED, slightly tachycardic. Will get labs, rapid strep, mono, UA. Will hydrate and give migraine cocktail and reassess.   10:24 PM Labs unremarkable. Strep neg, mono neg. Hydrated and felt better. Fever and tachycardia improved. No vomiting in the ED. Likely viral gastro. Will dc home with zofran prn.    Final Clinical Impressions(s) / ED Diagnoses   Final diagnoses:  None    ED Discharge Orders  None       Charlynne PanderYao, Ozzie Remmers Hsienta, MD 09/27/17 2225

## 2017-09-27 NOTE — ED Triage Notes (Signed)
C/o body aches, HA, sore throat, diarrhea-sx started last night-NAD-steady gait

## 2018-03-10 ENCOUNTER — Encounter (HOSPITAL_BASED_OUTPATIENT_CLINIC_OR_DEPARTMENT_OTHER): Payer: Self-pay | Admitting: Emergency Medicine

## 2018-03-10 ENCOUNTER — Emergency Department (HOSPITAL_BASED_OUTPATIENT_CLINIC_OR_DEPARTMENT_OTHER)
Admission: EM | Admit: 2018-03-10 | Discharge: 2018-03-10 | Disposition: A | Discharge status: Home / Self Care | Payer: Medicaid Other | Attending: Emergency Medicine | Admitting: Emergency Medicine

## 2018-03-10 DIAGNOSIS — R0981 Nasal congestion: Secondary | ICD-10-CM | POA: Insufficient documentation

## 2018-03-10 DIAGNOSIS — B9789 Other viral agents as the cause of diseases classified elsewhere: Secondary | ICD-10-CM | POA: Insufficient documentation

## 2018-03-10 DIAGNOSIS — Z91040 Latex allergy status: Secondary | ICD-10-CM | POA: Diagnosis not present

## 2018-03-10 DIAGNOSIS — Z87891 Personal history of nicotine dependence: Secondary | ICD-10-CM | POA: Insufficient documentation

## 2018-03-10 DIAGNOSIS — R070 Pain in throat: Secondary | ICD-10-CM | POA: Diagnosis not present

## 2018-03-10 DIAGNOSIS — R05 Cough: Secondary | ICD-10-CM | POA: Diagnosis present

## 2018-03-10 DIAGNOSIS — J069 Acute upper respiratory infection, unspecified: Secondary | ICD-10-CM | POA: Diagnosis not present

## 2018-03-10 MED ORDER — GUAIFENESIN-CODEINE 100-10 MG/5ML PO SYRP: 5.0000 mL | ORAL_SOLUTION | Freq: Three times a day (TID) | ORAL | 0 refills | Status: DC | PRN

## 2018-03-10 MED FILL — GUAIATUSSIN AC LIQUID: 100-10 | 8 days supply | Qty: 120 | Fill #0

## 2018-03-10 NOTE — Discharge Instructions (Signed)
Please read and follow all provided instructions.  Your diagnoses today include:  1. Viral URI with cough     You appear to have an upper respiratory infection (URI). An upper respiratory tract infection, or cold, is a viral infection of the air passages leading to the lungs. It should improve gradually after 5-7 days. You may have a lingering cough that lasts for 2- 4 weeks after the infection.  Tests performed today include:  Vital signs. See below for your results today.   Medications prescribed:   Guaifenesin with codeine -medication for cough  Do not try to work or drive while taking this medication as it can make you drowsy  Take any prescribed medications only as directed. Treatment for your infection is aimed at treating the symptoms. There are no medications, such as antibiotics, that will cure your infection.   Home care instructions:  Follow any educational materials contained in this packet.   Your illness is contagious and can be spread to others, especially during the first 3 or 4 days. It cannot be cured by antibiotics or other medicines. Take basic precautions such as washing your hands often, covering your mouth when you cough or sneeze, and avoiding public places where you could spread your illness to others.   Please continue drinking plenty of fluids.  Use over-the-counter medicines as needed as directed on packaging for symptom relief.  You may also use ibuprofen or tylenol as directed on packaging for pain or fever.  Do not take multiple medicines containing Tylenol or acetaminophen to avoid taking too much of this medication.  Follow-up instructions: Please follow-up with your primary care provider in the next 3 days for further evaluation of your symptoms if you are not feeling better.   Return instructions:   Please return to the Emergency Department if you experience worsening symptoms.   RETURN IMMEDIATELY IF you develop shortness of breath, confusion or  altered mental status, a new rash, become dizzy, faint, or poorly responsive, or are unable to be cared for at home.  Please return if you have persistent vomiting and cannot keep down fluids or develop a fever that is not controlled by tylenol or motrin.    Please return if you have any other emergent concerns.  Additional Information:  Your vital signs today were: BP 112/70 (BP Location: Left Arm)    Pulse 88    Temp 98.2 F (36.8 C)    Resp 18    Ht 4\' 11"  (1.499 m)    Wt 53.1 kg    LMP 02/07/2018    SpO2 100%    BMI 23.63 kg/m  If your blood pressure (BP) was elevated above 135/85 this visit, please have this repeated by your doctor within one month. --------------

## 2018-03-10 NOTE — ED Notes (Signed)
C/o cough, congestion, runny nose onset last pm

## 2018-03-10 NOTE — ED Provider Notes (Signed)
[facility] EMERGENCY DEPARTMENT Provider Note   CSN: [id number] Arrival date & time: 03/10/18  1011     History   Chief Complaint Chief Complaint  Patient presents with  . URI    HPI Jamie Hansen is a 23 y.o. female.  Patient presents the emergency department with complaints of cough and nasal congestion over the past 2 days.  Cough is frequent and interferes with sleep.  She complains of a sore throat.  No chest pain or shortness of breath.  No nausea, vomiting, or diarrhea.  No reported fevers.  Family member is sick with similar symptoms.  They were seen in the emergency department yesterday and prescribed guaifenesin with codeine.  Patient states that she took some of her family's medicine yesterday and it helped her feel better.  Onset of symptoms acute.  Course is constant.     Past Medical History:  Diagnosis Date  . Bipolar 1 disorder (HCC)   . Chlamydia   . Complication of anesthesia    low BP, drowsy  . Depression   . Pregnancy induced hypertension     Patient Active Problem List   Diagnosis Date Noted  . Encounter for sterilization 02/23/2017  . Indication for care in labor and delivery, antepartum 02/22/2017  . History of pre-eclampsia in prior pregnancy, currently pregnant 07/11/2016  . Bipolar disorder, unspecified (HCC) 11/14/2012  . Anxiety 11/14/2012  . History of chlamydia infection 11/14/2012    Past Surgical History:  Procedure Laterality Date  . CESAREAN SECTION    . HERNIA REPAIR    . TUBAL LIGATION Bilateral 02/23/2017   Procedure: POST PARTUM TUBAL LIGATION;  Surgeon: Federico FlakeNewton, Kimberly Niles, MD;  [facility];  Service: Gynecology;  Laterality: Bilateral;     OB History    Gravida  2   Para  2   Term  2   Preterm      AB      Living  2     SAB      TAB      Ectopic      Multiple  0   Live Births  2            Home Medications    Prior to Admission medications   Medication  Sig Start Date End Date Taking? Authorizing Provider  cephALEXin (KEFLEX) 500 MG capsule Take 1 capsule (500 mg total) by mouth 4 (four) times daily. 05/26/17   Gwyneth SproutPlunkett, Whitney, MD  guaiFENesin-codeine (ROBITUSSIN AC) 100-10 MG/5ML syrup Take 5 mLs by mouth 3 (three) times daily as needed for cough. 03/10/18   Renne CriglerGeiple, Emanuelle Hammerstrom, PA-C  ibuprofen (ADVIL,MOTRIN) 600 MG tablet Take 1 tablet (600 mg total) by mouth every 6 (six) hours. 02/25/17   Aviva SignsWilliams, Marie L, CNM  ondansetron (ZOFRAN ODT) 4 MG disintegrating tablet 4mg  ODT q6 hours prn nausea/vomit 09/27/17   Charlynne PanderYao, David Hsienta, MD  predniSONE (STERAPRED UNI-PAK 21 TAB) 10 MG (21) TBPK tablet Take 6 tabs by mouth daily  for 2 days, then 5 tabs for 2 days, then 4 tabs for 2 days, then 3 tabs for 2 days, 2 tabs for 2 days, then 1 tab by mouth daily for 2 days 06/04/17   Jacalyn LefevreHaviland, Julie, MD  valACYclovir (VALTREX) 1000 MG tablet Take 1 tablet (1,000 mg total) by mouth 3 (three) times daily. 06/04/17   Jacalyn LefevreHaviland, Julie, MD    Family History Family History  Problem Relation Age of Onset  . Cancer Maternal Grandmother  Social History Social History   Tobacco Use  . Smoking status: Former Games developermoker  . Smokeless tobacco: Never Used  Substance Use Topics  . Alcohol use: Yes    Comment: occ  . Drug use: No     Allergies   Latex   Review of Systems Review of Systems  Constitutional: Negative for chills, fatigue and fever.  HENT: Positive for congestion and sore throat. Negative for ear pain, rhinorrhea and sinus pressure.   Eyes: Negative for redness.  Respiratory: Positive for cough. Negative for wheezing.   Gastrointestinal: Negative for abdominal pain, diarrhea, nausea and vomiting.  Genitourinary: Negative for dysuria.  Musculoskeletal: Negative for myalgias and neck stiffness.  Skin: Negative for rash.  Neurological: Negative for headaches.  Hematological: Negative for adenopathy.     Physical Exam Updated Vital Signs BP 112/70 (BP  Location: Left Arm)   Pulse 88   Temp 98.2 F (36.8 C)   Resp 18   Ht 4\' 11"  (1.499 m)   Wt 53.1 kg   LMP 02/07/2018   SpO2 100%   BMI 23.63 kg/m   Physical Exam Vitals signs and nursing note reviewed.  Constitutional:      Appearance: She is well-developed.  HENT:     Head: Normocephalic and atraumatic.     Jaw: No trismus.     Right Ear: Tympanic membrane, ear canal and external ear normal.     Left Ear: Tympanic membrane, ear canal and external ear normal.     Nose: Nose normal. No mucosal edema or rhinorrhea.     Mouth/Throat:     Mouth: Mucous membranes are not dry. Oral lesions present.     Pharynx: Uvula midline. Posterior oropharyngeal erythema present. No oropharyngeal exudate or uvula swelling.     Tonsils: No tonsillar abscesses.   Eyes:     General:        Right eye: No discharge.        Left eye: No discharge.     Conjunctiva/sclera: Conjunctivae normal.  Neck:     Musculoskeletal: Normal range of motion and neck supple.  Cardiovascular:     Rate and Rhythm: Normal rate and regular rhythm.     Heart sounds: Normal heart sounds.  Pulmonary:     Effort: Pulmonary effort is normal. No respiratory distress.     Breath sounds: Normal breath sounds. No wheezing or rales.  Abdominal:     Palpations: Abdomen is soft.     Tenderness: There is no abdominal tenderness.  Lymphadenopathy:     Cervical: No cervical adenopathy.  Skin:    General: Skin is warm and dry.  Neurological:     Mental Status: She is alert.      ED Treatments / Results  Labs (all labs ordered are listed, but only abnormal results are displayed) Labs Reviewed - No data to display  EKG None  Radiology No results found.  Procedures Procedures (including critical care time)  Medications Ordered in ED Medications - No data to display   Initial Impression / Assessment and Plan / ED Course  I have reviewed the triage vital signs and the nursing notes.  Pertinent labs & imaging  results that were available during my care of the patient were reviewed by me and considered in my medical decision making (see chart for details).     Patient seen and examined.   Vital signs reviewed and are as follows: BP 112/70 (BP Location: Left Arm)   Pulse 88  Temp 98.2 F (36.8 C)   Resp 18   Ht 4\' 11"  (1.499 m)   Wt 53.1 kg   LMP 02/07/2018   SpO2 100%   BMI 23.63 kg/m   Patient counseled on supportive care for viral URI and s/s to return including worsening symptoms, persistent fever, persistent vomiting, or if they have any other concerns. Urged to see PCP if symptoms persist for more than 3 days. Patient verbalizes understanding and agrees with plan.   [place] substance reporting database reviewed.  Patient counseled on use of narcotic cough medications. Counseled not to combine these medications with others containing tylenol. Urged not to drink alcohol, drive, or perform any other activities that requires focus while taking these medications. The patient verbalizes understanding and agrees with the plan.   Final Clinical Impressions(s) / ED Diagnoses   Final diagnoses:  Viral URI with cough   Patient with symptoms consistent with a viral syndrome. Vitals are stable, no fever. No signs of dehydration. Lung exam normal, no signs of pneumonia. Supportive therapy indicated with return if symptoms worsen.     ED Discharge Orders         Ordered    guaiFENesin-codeine [facility]) 100-10 MG/5ML syrup  3 times daily PRN     03/10/18 1042           Renne Crigler, PA-C 03/10/18 1048    Terrilee Files, MD 03/10/18 1730

## 2018-03-10 NOTE — ED Triage Notes (Addendum)
Cough and nasal congestion since last night . Took Mom's tylenol with codeine cough syrup last night and states,"it really helps'

## 2018-03-23 ENCOUNTER — Emergency Department (HOSPITAL_BASED_OUTPATIENT_CLINIC_OR_DEPARTMENT_OTHER)
Admission: EM | Admit: 2018-03-23 | Discharge: 2018-03-23 | Disposition: A | Discharge status: Home / Self Care | Payer: Medicaid Other | Attending: Emergency Medicine | Admitting: Emergency Medicine

## 2018-03-23 ENCOUNTER — Other Ambulatory Visit: Payer: Self-pay

## 2018-03-23 ENCOUNTER — Encounter (HOSPITAL_BASED_OUTPATIENT_CLINIC_OR_DEPARTMENT_OTHER): Payer: Self-pay | Admitting: Emergency Medicine

## 2018-03-23 DIAGNOSIS — R197 Diarrhea, unspecified: Secondary | ICD-10-CM | POA: Insufficient documentation

## 2018-03-23 DIAGNOSIS — Z79899 Other long term (current) drug therapy: Secondary | ICD-10-CM | POA: Diagnosis not present

## 2018-03-23 DIAGNOSIS — R1084 Generalized abdominal pain: Secondary | ICD-10-CM | POA: Insufficient documentation

## 2018-03-23 DIAGNOSIS — R112 Nausea with vomiting, unspecified: Secondary | ICD-10-CM | POA: Insufficient documentation

## 2018-03-23 DIAGNOSIS — Z87891 Personal history of nicotine dependence: Secondary | ICD-10-CM | POA: Insufficient documentation

## 2018-03-23 LAB — PREGNANCY, URINE: PREG TEST UR: NEGATIVE

## 2018-03-23 MED ORDER — ONDANSETRON 4 MG PO TBDP: 4.0000 mg | ORAL_TABLET | Freq: Three times a day (TID) | ORAL | 0 refills | Status: DC | PRN

## 2018-03-23 MED ORDER — ACETAMINOPHEN 500 MG PO TABS
1000.0000 mg | ORAL_TABLET | Freq: Once | ORAL | Status: AC
  Administered 2018-03-23: 1000 mg via ORAL
  Filled 2018-03-23: qty 2

## 2018-03-23 MED ORDER — ONDANSETRON 4 MG PO TBDP
4.0000 mg | ORAL_TABLET | Freq: Once | ORAL | Status: AC | PRN
  Administered 2018-03-23: 4 mg via ORAL
  Filled 2018-03-23: qty 1

## 2018-03-23 MED FILL — ONDANSETRON ODT 4 MG TABLET: 4 | 2 days supply | Qty: 6 | Fill #0

## 2018-03-23 NOTE — ED Triage Notes (Signed)
Pt reports NVD after eating at [facility] yesterday

## 2018-03-23 NOTE — Discharge Instructions (Signed)
You can take Tylenol or Ibuprofen as directed for pain. You can alternate Tylenol and Ibuprofen every 4 hours. If you take Tylenol at 1pm, then you can take Ibuprofen at 5pm. Then you can take Tylenol again at 9pm.  ° °Take zofran as needed for nausea/vomiting.  ° °Make sure you are drinking plenty of fluids and staying hydrated. ° °Return emergency department for any high fever, blood in vomit or stool, worsening abdominal pain or any other worsening or concerning symptoms. °

## 2018-03-23 NOTE — ED Provider Notes (Signed)
[facility] EMERGENCY DEPARTMENT Provider Note   CSN: [id number] Arrival date & time: 03/23/18  0920     History   Chief Complaint Chief Complaint  Patient presents with  . Emesis  . Diarrhea    HPI Jamie Hansen is a 23 y.o. female who presents for evaluation of nausea, vomiting, diarrhea and generalized abdominal pain that began approxi-6 AM this morning.  Patient reports that she ate [employer] last night and states that family members who also ate at [place] have similar symptoms.  She states she has had 3-4 episodes of nonbloody diarrhea.  She she has had 2 episodes of vomiting.  No blood noted in emesis.  She reports that the abdominal pain is generalized with no focal point.  She has not taken any medications.  Patient denies any fevers, chest pain, shortness of breath, dysuria, hematuria.  The history is provided by the patient.    Past Medical History:  Diagnosis Date  . Bipolar 1 disorder (HCC)   . Chlamydia   . Complication of anesthesia    low BP, drowsy  . Depression   . Pregnancy induced hypertension     Patient Active Problem List   Diagnosis Date Noted  . Encounter for sterilization 02/23/2017  . Indication for care in labor and delivery, antepartum 02/22/2017  . History of pre-eclampsia in prior pregnancy, currently pregnant 07/11/2016  . Bipolar disorder, unspecified (HCC) 11/14/2012  . Anxiety 11/14/2012  . History of chlamydia infection 11/14/2012    Past Surgical History:  Procedure Laterality Date  . CESAREAN SECTION    . HERNIA REPAIR    . TUBAL LIGATION Bilateral 02/23/2017   Procedure: POST PARTUM TUBAL LIGATION;  Surgeon: Federico Flake, MD;  Location: [facility];  Service: Gynecology;  Laterality: Bilateral;     OB History    Gravida  2   Para  2   Term  2   Preterm      AB      Living  2     SAB      TAB      Ectopic      Multiple  0   Live Births  2            Home Medications      Prior to Admission medications   Medication Sig Start Date End Date Taking? Authorizing Provider  cephALEXin (KEFLEX) 500 MG capsule Take 1 capsule (500 mg total) by mouth 4 (four) times daily. 05/26/17   Gwyneth Sprout, MD  guaiFENesin-codeine (ROBITUSSIN AC) 100-10 MG/5ML syrup Take 5 mLs by mouth 3 (three) times daily as needed for cough. 03/10/18   Renne Crigler, PA-C  ibuprofen (ADVIL,MOTRIN) 600 MG tablet Take 1 tablet (600 mg total) by mouth every 6 (six) hours. 02/25/17   Aviva Signs, CNM  ondansetron (ZOFRAN ODT) 4 MG disintegrating tablet Take 1 tablet (4 mg total) by mouth every 8 (eight) hours as needed for nausea or vomiting. 03/23/18   Maxwell Caul, PA-C  predniSONE (STERAPRED UNI-PAK 21 TAB) 10 MG (21) TBPK tablet Take 6 tabs by mouth daily  for 2 days, then 5 tabs for 2 days, then 4 tabs for 2 days, then 3 tabs for 2 days, 2 tabs for 2 days, then 1 tab by mouth daily for 2 days 06/04/17   Jacalyn Lefevre, MD  valACYclovir (VALTREX) 1000 MG tablet Take 1 tablet (1,000 mg total) by mouth 3 (three) times daily. 06/04/17   Haviland,  Raynelle FanningJulie, MD    Family History Family History  Problem Relation Age of Onset  . Cancer Maternal Grandmother     Social History Social History   Tobacco Use  . Smoking status: Former Games developermoker  . Smokeless tobacco: Never Used  Substance Use Topics  . Alcohol use: Yes    Comment: occ  . Drug use: No     Allergies   Latex   Review of Systems Review of Systems  Constitutional: Negative for fever.  Respiratory: Negative for cough and shortness of breath.   Cardiovascular: Negative for chest pain.  Gastrointestinal: Positive for abdominal pain, diarrhea, nausea and vomiting. Negative for blood in stool.  Genitourinary: Negative for dysuria and hematuria.  Neurological: Negative for headaches.  All other systems reviewed and are negative.    Physical Exam Updated Vital Signs BP 116/69   Pulse 77   Temp (!) 97.5 F (36.4 C)  (Oral)   Resp 16   Ht 4\' 11"  (1.499 m)   Wt 52.2 kg   LMP 02/08/2018   SpO2 100%   BMI 23.23 kg/m   Physical Exam Vitals signs and nursing note reviewed.  Constitutional:      Appearance: Normal appearance. She is well-developed.  HENT:     Head: Normocephalic and atraumatic.  Eyes:     General: Lids are normal.     Conjunctiva/sclera: Conjunctivae normal.     Pupils: Pupils are equal, round, and reactive to light.  Neck:     Musculoskeletal: Full passive range of motion without pain.  Cardiovascular:     Rate and Rhythm: Normal rate and regular rhythm.     Pulses: Normal pulses.     Heart sounds: Normal heart sounds. No murmur. No friction rub. No gallop.   Pulmonary:     Effort: Pulmonary effort is normal.     Breath sounds: Normal breath sounds.     Comments: Lungs clear to auscultation bilaterally.  Symmetric chest rise.  No wheezing, rales, rhonchi. Abdominal:     Palpations: Abdomen is soft. Abdomen is not rigid.     Tenderness: There is generalized abdominal tenderness. There is no right CVA tenderness, left CVA tenderness or guarding.     Comments: Abdomen is soft, non-distended. Generalized tenderness with no focal point. No rigidity, No guarding. No peritoneal signs.  Musculoskeletal: Normal range of motion.  Skin:    General: Skin is warm and dry.     Capillary Refill: Capillary refill takes less than 2 seconds.  Neurological:     Mental Status: She is alert and oriented to person, place, and time.  Psychiatric:        Speech: Speech normal.      ED Treatments / Results  Labs (all labs ordered are listed, but only abnormal results are displayed) Labs Reviewed  PREGNANCY, URINE    EKG None  Radiology No results found.  Procedures Procedures (including critical care time)  Medications Ordered in ED Medications  ondansetron (ZOFRAN-ODT) disintegrating tablet 4 mg (4 mg Oral Given 03/23/18 0944)  acetaminophen (TYLENOL) tablet 1,000 mg (1,000 mg  Oral Given 03/23/18 1042)     Initial Impression / Assessment and Plan / ED Course  I have reviewed the triage vital signs and the nursing notes.  Pertinent labs & imaging results that were available during my care of the patient were reviewed by me and considered in my medical decision making (see chart for details).     23 year old female who presents for evaluation of  generalized abdominal pain, nausea/vomiting, diarrhea that began this morning.  Patient reports that family at home is sick with similar symptoms.  Reports that they all ate [employer] last night.  She states she has not had any fever.  She has not taken medications for the pain.  Vomiting, diarrhea is nonbloody, nonbilious. Patient is afebrile, non-toxic appearing, sitting comfortably on examination table. Vital signs reviewed and stable.  Exam, patient with generalized abdominal pain.  No focal point.  History/physical exam not concerning for appendicitis, diverticulitis, obstruction.  Consider gastroenteritis/foodborne illness/viral illness.  Given.  We will plan to p.o. challenge.  Patient will tolerate p.o. without any difficulty.  Vital signs are stable.  Patient stable for discharge at this time.  Encourage at home supportive care measures.  At this time, patient with no indication for CT abdomen pelvis as abdominal exam is not concerning for surgical abdomen. At this time, patient exhibits no emergent life-threatening condition that require further evaluation in ED or admission. Patient had ample opportunity for questions and discussion. All patient's questions were answered with full understanding. Strict return precautions discussed. Patient expresses understanding and agreement to plan.   Portions of this note were generated with Scientist, clinical (histocompatibility and immunogenetics)Dragon dictation software. Dictation errors may occur despite best attempts at proofreading.  Final Clinical Impressions(s) / ED Diagnoses   Final diagnoses:  Nausea vomiting and diarrhea   Generalized abdominal pain    ED Discharge Orders         Ordered    ondansetron (ZOFRAN ODT) 4 MG disintegrating tablet  Every 8 hours PRN     03/23/18 1103           Maxwell CaulLayden, Keiona Jenison A, PA-C 03/23/18 1720    Little, Ambrose Finlandachel Morgan, MD 03/25/18 [phone]

## 2018-08-27 ENCOUNTER — Emergency Department (HOSPITAL_BASED_OUTPATIENT_CLINIC_OR_DEPARTMENT_OTHER): Payer: Medicaid Other

## 2018-08-27 ENCOUNTER — Emergency Department (HOSPITAL_BASED_OUTPATIENT_CLINIC_OR_DEPARTMENT_OTHER)
Admission: EM | Admit: 2018-08-27 | Discharge: 2018-08-27 | Disposition: A | Discharge status: Home / Self Care | Payer: Medicaid Other | Attending: Emergency Medicine | Admitting: Emergency Medicine

## 2018-08-27 ENCOUNTER — Encounter (HOSPITAL_BASED_OUTPATIENT_CLINIC_OR_DEPARTMENT_OTHER): Payer: Self-pay | Admitting: Emergency Medicine

## 2018-08-27 ENCOUNTER — Other Ambulatory Visit: Payer: Self-pay

## 2018-08-27 DIAGNOSIS — H6692 Otitis media, unspecified, left ear: Secondary | ICD-10-CM | POA: Insufficient documentation

## 2018-08-27 DIAGNOSIS — H6690 Otitis media, unspecified, unspecified ear: Secondary | ICD-10-CM

## 2018-08-27 DIAGNOSIS — Z87891 Personal history of nicotine dependence: Secondary | ICD-10-CM | POA: Insufficient documentation

## 2018-08-27 DIAGNOSIS — Z91040 Latex allergy status: Secondary | ICD-10-CM | POA: Diagnosis not present

## 2018-08-27 DIAGNOSIS — R221 Localized swelling, mass and lump, neck: Secondary | ICD-10-CM | POA: Diagnosis present

## 2018-08-27 LAB — HCG, SERUM, QUALITATIVE: Preg, Serum: NEGATIVE

## 2018-08-27 LAB — GROUP A STREP BY PCR: Group A Strep by PCR: NOT DETECTED

## 2018-08-27 MED ORDER — AMOXICILLIN-POT CLAVULANATE 875-125 MG PO TABS: 1.0000 | ORAL_TABLET | Freq: Two times a day (BID) | ORAL | 0 refills | Status: DC

## 2018-08-27 MED ORDER — IOHEXOL 300 MG/ML  SOLN
100.0000 mL | Freq: Once | INTRAMUSCULAR | Status: AC | PRN
  Administered 2018-08-27: 75 mL via INTRAVENOUS

## 2018-08-27 NOTE — ED Triage Notes (Signed)
Pt reports neck swelling with painful swallowing x 1 week and L ear pain since yesterday.

## 2018-08-27 NOTE — ED Notes (Signed)
ED Provider at bedside. Dr. Yelverton 

## 2018-08-27 NOTE — ED Provider Notes (Signed)
[facility] DEPARTMENT Provider Note   CSN: [id number] Arrival date & time: 08/27/18  1450    History   Chief Complaint Chief Complaint  Patient presents with  . neck swelling  . Otalgia    HPI Ansleigh Safer is a 23 y.o. female.     HPI Patient presents with 1 week of left ear pain and then she noticed a small lump on her anterior neck.  States that the ear pain has improved and she now has pain and difficulty with swallowing.  She is tolerating her secretions.  She has had no voice changes.  She denies difficulty breathing. Past Medical History:  Diagnosis Date  . Bipolar 1 disorder ([place])   . Chlamydia   . Complication of anesthesia    low BP, drowsy  . Depression   . Pregnancy induced hypertension     Patient Active Problem List   Diagnosis Date Noted  . Encounter for sterilization 02/23/2017  . Indication for care in labor and delivery, antepartum 02/22/2017  . History of pre-eclampsia in prior pregnancy, currently pregnant 07/11/2016  . Bipolar disorder, unspecified ([place]) 11/14/2012  . Anxiety 11/14/2012  . History of chlamydia infection 11/14/2012    Past Surgical History:  Procedure Laterality Date  . CESAREAN SECTION    . HERNIA REPAIR    . TUBAL LIGATION Bilateral 02/23/2017   Procedure: POST PARTUM TUBAL LIGATION;  Surgeon: [name], MD;  Location: [place];  Service: Gynecology;  Laterality: Bilateral;     OB History    Gravida  2   Para  2   Term  2   Preterm      AB      Living  2     SAB      TAB      Ectopic      Multiple  0   Live Births  2            Home Medications    Prior to Admission medications   Medication Sig Start Date End Date Taking? Authorizing Provider  amoxicillin-clavulanate (AUGMENTIN) 875-125 MG tablet Take 1 tablet by mouth 2 (two) times daily. One po bid x 7 days 08/27/18   Julianne Rice, MD  cephALEXin (KEFLEX) 500 MG capsule Take 1 capsule (500  mg total) by mouth 4 (four) times daily. 05/26/17   Blanchie Dessert, MD  guaiFENesin-codeine (ROBITUSSIN AC) 100-10 MG/5ML syrup Take 5 mLs by mouth 3 (three) times daily as needed for cough. 03/10/18   Carlisle Cater, PA-C  ibuprofen (ADVIL,MOTRIN) 600 MG tablet Take 1 tablet (600 mg total) by mouth every 6 (six) hours. 02/25/17   Seabron Spates, CNM  ondansetron (ZOFRAN ODT) 4 MG disintegrating tablet Take 1 tablet (4 mg total) by mouth every 8 (eight) hours as needed for nausea or vomiting. 03/23/18   Volanda Napoleon, PA-C  predniSONE (STERAPRED UNI-PAK 21 TAB) 10 MG (21) TBPK tablet Take 6 tabs by mouth daily  for 2 days, then 5 tabs for 2 days, then 4 tabs for 2 days, then 3 tabs for 2 days, 2 tabs for 2 days, then 1 tab by mouth daily for 2 days 06/04/17   Isla Pence, MD  valACYclovir (VALTREX) 1000 MG tablet Take 1 tablet (1,000 mg total) by mouth 3 (three) times daily. 06/04/17   Isla Pence, MD    Family History Family History  Problem Relation Age of Onset  . Cancer Maternal Grandmother  Social History Social History   Tobacco Use  . Smoking status: Former Games developermoker  . Smokeless tobacco: Never Used  Substance Use Topics  . Alcohol use: Yes    Comment: occ  . Drug use: No     Allergies   Latex   Review of Systems Review of Systems  Constitutional: Negative for chills and fever.  HENT: Positive for sore throat and trouble swallowing. Negative for voice change.   Respiratory: Negative for cough and shortness of breath.   Cardiovascular: Negative for chest pain.  Gastrointestinal: Negative for nausea and vomiting.  Musculoskeletal: Positive for neck pain. Negative for neck stiffness.  Skin: Negative for rash and wound.  Neurological: Negative for dizziness, weakness, light-headedness, numbness and headaches.  All other systems reviewed and are negative.    Physical Exam Updated Vital Signs BP (!) 162/91 (BP Location: Right Arm)   Pulse 93   Temp 98.6 F  (37 C) (Oral)   Resp 18   Ht 4\' 11"  (1.499 m)   Wt 58.1 kg   LMP 08/17/2018   SpO2 100%   BMI 25.85 kg/m   Physical Exam Vitals signs reviewed.  Constitutional:      Appearance: Normal appearance.  HENT:     Head: Normocephalic and atraumatic.     Comments: Patient is tolerating secretions.  Speaks in a clear voice.    Right Ear: Tympanic membrane normal.     Left Ear: External ear normal.     Ears:     Comments: Bulging left TM.     Nose: Nose normal.     Mouth/Throat:     Mouth: Mucous membranes are moist.     Pharynx: Posterior oropharyngeal erythema present. No oropharyngeal exudate.  Eyes:     Extraocular Movements: Extraocular movements intact.     Pupils: Pupils are equal, round, and reactive to light.  Neck:     Musculoskeletal: Normal range of motion and neck supple. No neck rigidity or muscular tenderness.     Vascular: No carotid bruit.     Comments: Patient with small anterior cervical lymph node just superior to the thyroid cartilage.  No erythema or warmth. Cardiovascular:     Rate and Rhythm: Normal rate and regular rhythm.     Heart sounds: No murmur. No friction rub. No gallop.   Pulmonary:     Effort: Pulmonary effort is normal. No respiratory distress.     Breath sounds: Normal breath sounds. No stridor. No wheezing, rhonchi or rales.  Chest:     Chest wall: No tenderness.  Abdominal:     Palpations: Abdomen is soft.  Musculoskeletal: Normal range of motion.        General: No swelling or tenderness.  Lymphadenopathy:     Cervical: Cervical adenopathy present.  Skin:    General: Skin is warm.     Capillary Refill: Capillary refill takes less than 2 seconds.     Findings: No erythema or rash.  Neurological:     General: No focal deficit present.     Mental Status: She is alert and oriented to person, place, and time.  Psychiatric:        Mood and Affect: Mood normal.        Behavior: Behavior normal.      ED Treatments / Results  Labs  (all labs ordered are listed, but only abnormal results are displayed) Labs Reviewed  GROUP A STREP BY PCR  HCG, SERUM, QUALITATIVE    EKG None  Radiology  Dg Neck Soft Tissue  Result Date: 08/27/2018 CLINICAL DATA:  23 year old female with neck swelling and painful swallowing x1 week. Left ear pain since yesterday. EXAM: NECK SOFT TISSUES - 1+ VIEW COMPARISON:  Head CT 06/04/2017. FINDINGS: Prevertebral soft tissue contours are stable since the scalp view in 2019 and within normal limits. There is a 6 millimeter oval calcification visible on the lateral view projecting over the hypopharynx, and this is not correlated on the AP or the prior study. Otherwise normal pharyngeal contours. Visualized tracheal air column is within normal limits. Negative visible chest. No osseous abnormality identified. IMPRESSION: 1. A 6 mm oval calcification projecting over the hypopharynx on the lateral view, not identified on the AP. Perhaps this is a large submandibular sialolith. Query sialadenitis. 2. Otherwise negative radiographic appearance of the neck. Electronically Signed   By: Odessa FlemingH  Hall M.D.   On: 08/27/2018 15:47   Ct Soft Tissue Neck W Contrast  Result Date: 08/27/2018 CLINICAL DATA:  23 year old female with neck swelling and painful swallowing x1 week. Left ear pain since yesterday. Possible 6 millimeters sialolith on plain films today. EXAM: CT NECK WITH CONTRAST TECHNIQUE: Multidetector CT imaging of the neck was performed using the standard protocol following the bolus administration of intravenous contrast. CONTRAST:  75mL OMNIPAQUE IOHEXOL 300 MG/ML  SOLN COMPARISON:  Neck radiographs earlier today. FINDINGS: Pharynx and larynx: Slight motion artifact at the supraglottic larynx. The larynx remains normal. Hypopharynx, oropharynx and nasopharynx contours are normal. Negative parapharyngeal and retropharyngeal spaces. Salivary glands: Negative sublingual space. The submandibular glands appear symmetric and  normal, with no submandibular space inflammation. There is no sialolithiasis. Based on these images I think a bulbous calcification of the posterior hyoid bone is responsible for the appearance on the earlier radiographs (series 3, image 49). The parotid glands appear symmetric and within normal limits. Thyroid: Negative. Lymph nodes: Cervical lymph nodes are symmetric and within normal limits for age, up to 8 millimeters short axis at the level 2 nodal stations. No heterogeneous nodes. Vascular: Major vascular structures in the neck and at the skull base are patent, and appear normal. Limited intracranial: Negative. Visualized orbits: Leftward gaze deviation, otherwise negative. Mastoids and visualized paranasal sinuses: Clear throughout. Skeleton: No acute dental finding. Impacted right side mandible wisdom tooth. No osseous abnormality identified. Upper chest: Normal lung apices and visible superior mediastinum with residual thymus. IMPRESSION: Normal CT appearance of the neck. I think slightly bulbous calcification of the posterior hyoid bone was simulating a calculus on the earlier radiographs. Electronically Signed   By: Odessa FlemingH  Hall M.D.   On: 08/27/2018 16:32    Procedures Procedures (including critical care time)  Medications Ordered in ED Medications  iohexol (OMNIPAQUE) 300 MG/ML solution 100 mL (75 mLs Intravenous Contrast Given 08/27/18 1610)     Initial Impression / Assessment and Plan / ED Course  I have reviewed the triage vital signs and the nursing notes.  Pertinent labs & imaging results that were available during my care of the patient were reviewed by me and considered in my medical decision making (see chart for details).        CT neck with calcification of the hyoid bone which may be causing the patient some of her symptoms.  Think she does likely have a left otitis media.  Will treat with antibiotics.  If she is still symptomatic I referred her to ENT.  There is no evidence of  airway compromise or need for emergent consult at this point.  Strict return precautions have been given.  Final Clinical Impressions(s) / ED Diagnoses   Final diagnoses:  Acute otitis media, unspecified otitis media type    ED Discharge Orders         Ordered    amoxicillin-clavulanate (AUGMENTIN) 875-125 MG tablet  2 times daily     08/27/18 1707           Loren RacerYelverton, Jabron Weese, MD 08/27/18 1711

## 2018-08-27 NOTE — Discharge Instructions (Addendum)
Follow-up with the ENT if your symptoms persist despite antibiotics.

## 2019-01-02 ENCOUNTER — Other Ambulatory Visit: Payer: Self-pay

## 2019-01-02 DIAGNOSIS — Z20822 Contact with and (suspected) exposure to covid-19: Secondary | ICD-10-CM

## 2019-01-04 LAB — NOVEL CORONAVIRUS, NAA: SARS-CoV-2, NAA: NOT DETECTED

## 2019-07-08 ENCOUNTER — Encounter (HOSPITAL_BASED_OUTPATIENT_CLINIC_OR_DEPARTMENT_OTHER): Payer: Self-pay | Admitting: Emergency Medicine

## 2019-07-08 ENCOUNTER — Other Ambulatory Visit: Payer: Self-pay

## 2019-07-08 ENCOUNTER — Emergency Department (HOSPITAL_BASED_OUTPATIENT_CLINIC_OR_DEPARTMENT_OTHER)
Admission: EM | Admit: 2019-07-08 | Discharge: 2019-07-09 | Disposition: A | Discharge status: Home / Self Care | Payer: 59 | Attending: Emergency Medicine | Admitting: Emergency Medicine

## 2019-07-08 DIAGNOSIS — Z87891 Personal history of nicotine dependence: Secondary | ICD-10-CM | POA: Insufficient documentation

## 2019-07-08 DIAGNOSIS — Z91040 Latex allergy status: Secondary | ICD-10-CM | POA: Diagnosis not present

## 2019-07-08 DIAGNOSIS — Z20822 Contact with and (suspected) exposure to covid-19: Secondary | ICD-10-CM | POA: Insufficient documentation

## 2019-07-08 DIAGNOSIS — J069 Acute upper respiratory infection, unspecified: Secondary | ICD-10-CM | POA: Diagnosis not present

## 2019-07-08 DIAGNOSIS — R05 Cough: Secondary | ICD-10-CM | POA: Diagnosis present

## 2019-07-08 NOTE — ED Triage Notes (Signed)
Reports uri symptoms for three days with cough headache, fever, head congestion.

## 2019-07-09 DIAGNOSIS — J069 Acute upper respiratory infection, unspecified: Secondary | ICD-10-CM | POA: Diagnosis not present

## 2019-07-09 LAB — SARS CORONAVIRUS 2 BY RT PCR (HOSPITAL ORDER, PERFORMED IN ~~LOC~~ HOSPITAL LAB): SARS Coronavirus 2: NEGATIVE

## 2019-07-09 LAB — GROUP A STREP BY PCR: Group A Strep by PCR: NOT DETECTED

## 2019-07-09 NOTE — Discharge Instructions (Addendum)
You were seen today for upper respiratory symptoms.  Your strep and Covid testing are negative.  Take Tylenol or ibuprofen for your pain.  Make sure to stay hydrated.

## 2019-07-09 NOTE — ED Provider Notes (Signed)
[facility] EMERGENCY DEPARTMENT Provider Note   CSN: [id number] Arrival date & time: 07/08/19  2047     History Chief Complaint  Patient presents with  . Cough  . URI    Jamie Hansen is a 24 y.o. female.  HPI     This is a 23 year old female with no significant reported past medical history who presents with upper respiratory symptoms including cough, headache, body aches.  She presents with her daughter who has similar symptoms.  They both have known sick contacts with her grandmother and brother.  She has not noted any fevers at home.  Describes a posterior headache that is 5 out of 10.  It is nonradiating.  She denies any vision changes, weakness, numbness, dizziness.  She does report some sore throat and nausea.  She also reports a nonproductive cough.  No known Covid exposures.  Past Medical History:  Diagnosis Date  . Bipolar 1 disorder ([place])   . Chlamydia   . Complication of anesthesia    low BP, drowsy  . Depression   . Pregnancy induced hypertension     Patient Active Problem List   Diagnosis Date Noted  . Encounter for sterilization 02/23/2017  . Indication for care in labor and delivery, antepartum 02/22/2017  . History of pre-eclampsia in prior pregnancy, currently pregnant 07/11/2016  . Bipolar disorder, unspecified ([place]) 11/14/2012  . Anxiety 11/14/2012  . History of chlamydia infection 11/14/2012    Past Surgical History:  Procedure Laterality Date  . CESAREAN SECTION    . HERNIA REPAIR    . TUBAL LIGATION Bilateral 02/23/2017   Procedure: POST PARTUM TUBAL LIGATION;  Surgeon: [name], MD;  Location: [facility];  Service: Gynecology;  Laterality: Bilateral;     OB History    Gravida  2   Para  2   Term  2   Preterm      AB      Living  2     SAB      TAB      Ectopic      Multiple  0   Live Births  2           Family History  Problem Relation Age of Onset  . Cancer Maternal  Grandmother     Social History   Tobacco Use  . Smoking status: Former Research scientist (life sciences)  . Smokeless tobacco: Never Used  Substance Use Topics  . Alcohol use: Yes    Comment: occ  . Drug use: No    Home Medications Prior to Admission medications   Medication Sig Start Date End Date Taking? Authorizing Provider  amoxicillin-clavulanate (AUGMENTIN) 875-125 MG tablet Take 1 tablet by mouth 2 (two) times daily. One po bid x 7 days 08/27/18   Julianne Rice, MD  cephALEXin (KEFLEX) 500 MG capsule Take 1 capsule (500 mg total) by mouth 4 (four) times daily. 05/26/17   Blanchie Dessert, MD  guaiFENesin-codeine (ROBITUSSIN AC) 100-10 MG/5ML syrup Take 5 mLs by mouth 3 (three) times daily as needed for cough. 03/10/18   Carlisle Cater, PA-C  ibuprofen (ADVIL,MOTRIN) 600 MG tablet Take 1 tablet (600 mg total) by mouth every 6 (six) hours. 02/25/17   Seabron Spates, CNM  ondansetron (ZOFRAN ODT) 4 MG disintegrating tablet Take 1 tablet (4 mg total) by mouth every 8 (eight) hours as needed for nausea or vomiting. 03/23/18   Volanda Napoleon, PA-C  predniSONE (STERAPRED UNI-PAK 21 TAB) 10 MG (21) TBPK  tablet Take 6 tabs by mouth daily  for 2 days, then 5 tabs for 2 days, then 4 tabs for 2 days, then 3 tabs for 2 days, 2 tabs for 2 days, then 1 tab by mouth daily for 2 days 06/04/17   Jacalyn Lefevre, MD  valACYclovir (VALTREX) 1000 MG tablet Take 1 tablet (1,000 mg total) by mouth 3 (three) times daily. 06/04/17   Jacalyn Lefevre, MD    Allergies    Latex  Review of Systems   Review of Systems  Constitutional: Negative for fever.  HENT: Positive for sore throat. Negative for congestion.   Respiratory: Positive for cough. Negative for shortness of breath.   Cardiovascular: Negative for chest pain.  Gastrointestinal: Negative for abdominal pain, nausea and vomiting.  Genitourinary: Negative for dysuria.  Musculoskeletal: Positive for myalgias.  Neurological: Positive for headaches.  All other systems  reviewed and are negative.   Physical Exam Updated Vital Signs BP 126/84 (BP Location: Right Arm)   Pulse 60   Temp 99 F (37.2 C) (Oral)   Resp 18   Ht 1.499 m (4\' 11" )   Wt 51.7 kg   SpO2 100%   BMI 23.03 kg/m   Physical Exam Vitals and nursing note reviewed.  Constitutional:      Appearance: She is well-developed. She is not ill-appearing.  HENT:     Head: Normocephalic and atraumatic.     Nose: Congestion present.     Mouth/Throat:     Mouth: Mucous membranes are moist.     Pharynx: Posterior oropharyngeal erythema present. No oropharyngeal exudate.  Eyes:     Pupils: Pupils are equal, round, and reactive to light.  Neck:     Comments: No meningismus Cardiovascular:     Rate and Rhythm: Normal rate and regular rhythm.     Heart sounds: Normal heart sounds.  Pulmonary:     Effort: Pulmonary effort is normal. No respiratory distress.     Breath sounds: No wheezing.  Abdominal:     General: Bowel sounds are normal.     Palpations: Abdomen is soft.  Musculoskeletal:     Cervical back: Normal range of motion and neck supple.     Right lower leg: No edema.     Left lower leg: No edema.  Skin:    General: Skin is warm and dry.  Neurological:     Mental Status: She is alert and oriented to person, place, and time.  Psychiatric:        Mood and Affect: Mood normal.     ED Results / Procedures / Treatments   Labs (all labs ordered are listed, but only abnormal results are displayed) Labs Reviewed  GROUP A STREP BY PCR  SARS CORONAVIRUS 2 BY RT PCR (HOSPITAL ORDER, PERFORMED IN [facility])    EKG None  Radiology No results found.  Procedures Procedures (including critical care time)  Medications Ordered in ED Medications - No data to display  ED Course  I have reviewed the triage vital signs and the nursing notes.  Pertinent labs & imaging results that were available during my care of the patient were reviewed by me and considered in  my medical decision making (see chart for details).    MDM Rules/Calculators/A&P                       Patient presents with her daughter with upper respiratory symptoms.  She is overall nontoxic and vital signs  are reassuring.  She is afebrile although her daughter is febrile.  Symptoms are highly suspicious for viral etiology.  Given current pandemic, will test for Covid as well as strep.  These tests are negative.  Suspect viral etiology.  Recommend supportive measures including Tylenol or Motrin for pain.  Making sure that she is staying hydrated.  After history, exam, and medical workup I feel the patient has been appropriately medically screened and is safe for discharge home. Pertinent diagnoses were discussed with the patient. Patient was given return precautions.  Jamie Hansen was evaluated in Emergency Department on 07/09/2019 for the symptoms described in the history of present illness. She was evaluated in the context of the global COVID-19 pandemic, which necessitated consideration that the patient might be at risk for infection with the SARS-CoV-2 virus that causes COVID-19. Institutional protocols and algorithms that pertain to the evaluation of patients at risk for COVID-19 are in a state of rapid change based on information released by regulatory bodies including the CDC and federal and state organizations. These policies and algorithms were followed during the patient's care in the ED.   Final Clinical Impression(s) / ED Diagnoses Final diagnoses:  Viral URI with cough    Rx / DC Orders ED Discharge Orders    None       Roya Gieselman, Mayer Masker, MD 07/09/19 [phone]

## 2019-10-23 IMAGING — CT CT NECK WITH CONTRAST
4 series · 15 of 33 positions shown, 18 images · IV contrast (omnipaque)
Comparison: Neck radiographs earlier today.

CLINICAL DATA: 22-year-old female with neck swelling and painful
swallowing x1 week. Left ear pain since yesterday. Possible 6
millimeters sialolith on plain films today.

EXAM:
CT NECK WITH CONTRAST
TECHNIQUE: Multidetector CT imaging of the neck was performed using the
standard protocol following the bolus administration of intravenous
contrast.
CONTRAST:  75mL OMNIPAQUE IOHEXOL 300 MG/ML  SOLN

[Series 3: axial neck · axial · 0.54mm/px · z∈[-228,-70]mm · 5 of 119 slices shown, 7 images]
[im 20/119  soft-tissue]
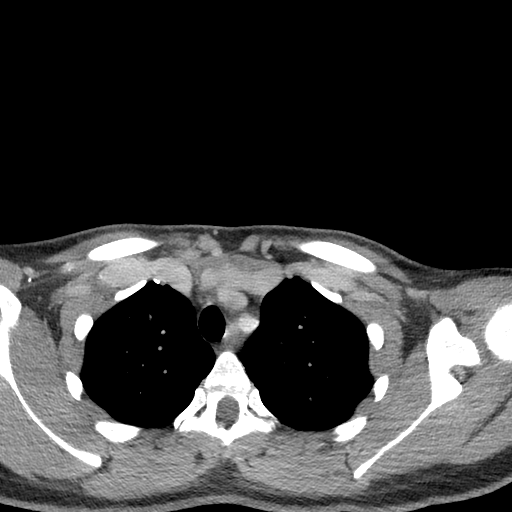
[im 20/119  bone]
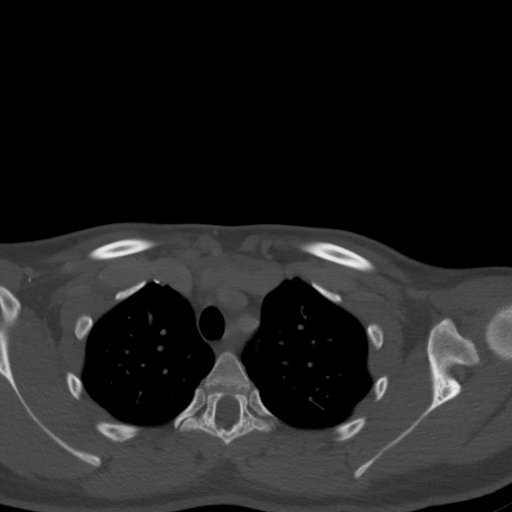
[im 40/119  bone]
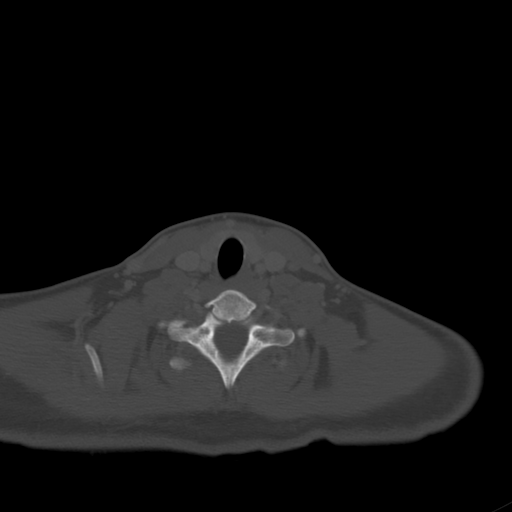
[im 60/119  bone]
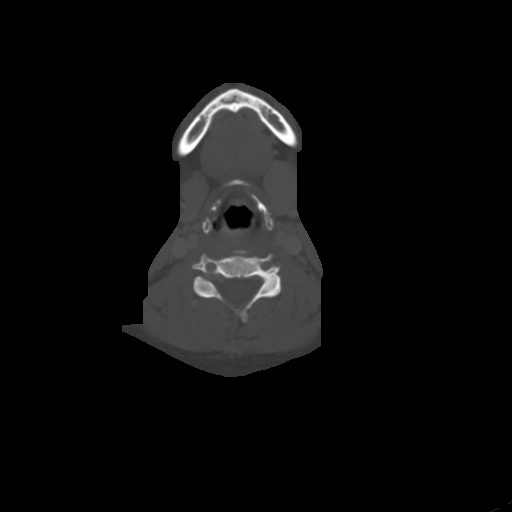
[im 79/119  bone]
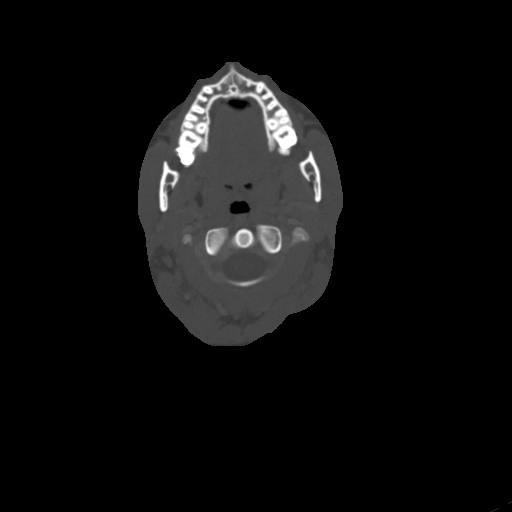
[im 99/119  soft-tissue]
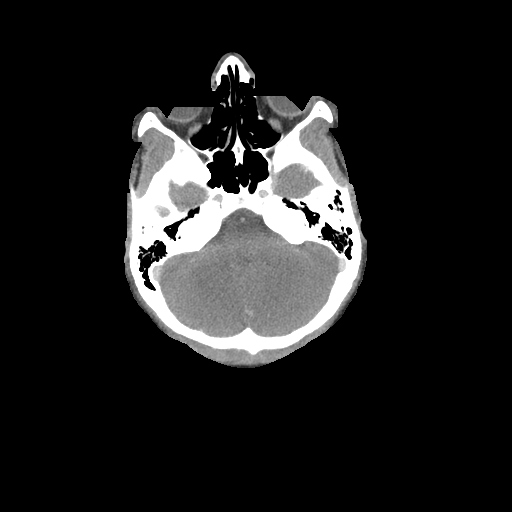
[im 99/119  bone]
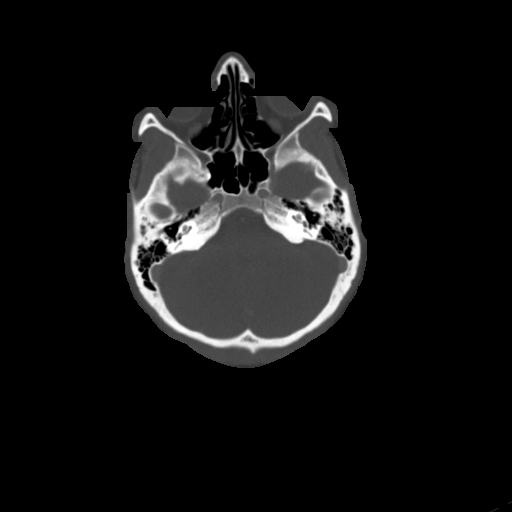

[Series 6: sag neck · sagittal · 0.56mm/px · 5 of 118 slices shown, 6 images]
[im 40/118  bone]
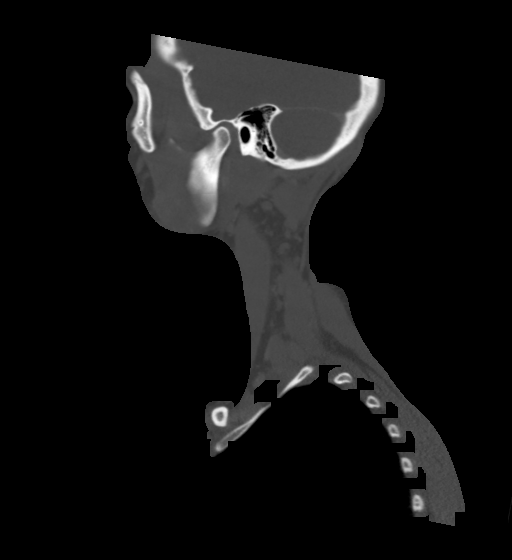
[im 49/118  bone]
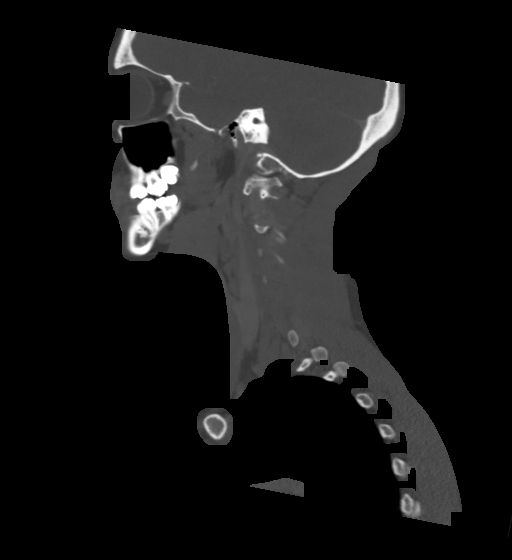
[im 59/118  soft-tissue]
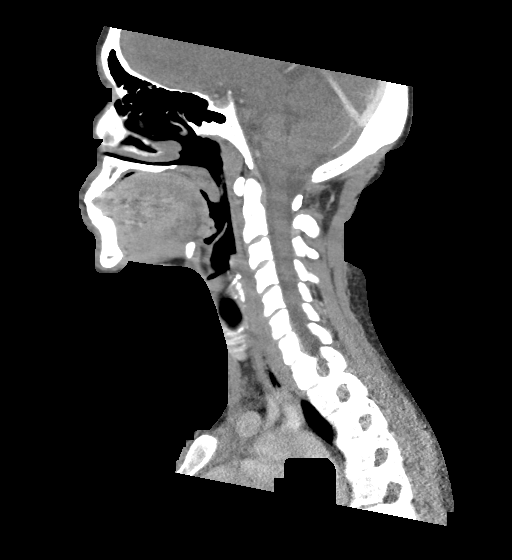
[im 59/118  bone]
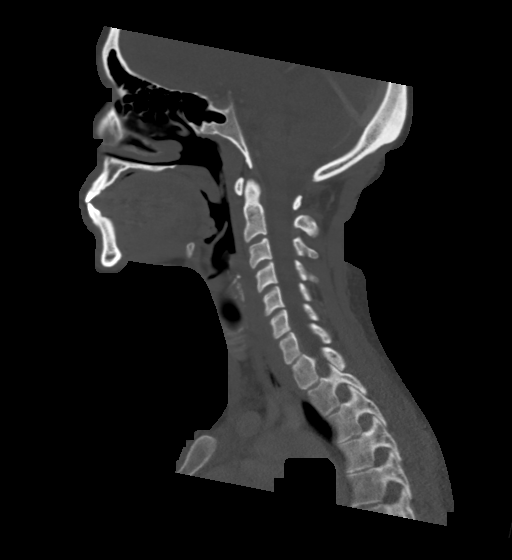
[im 69/118  bone]
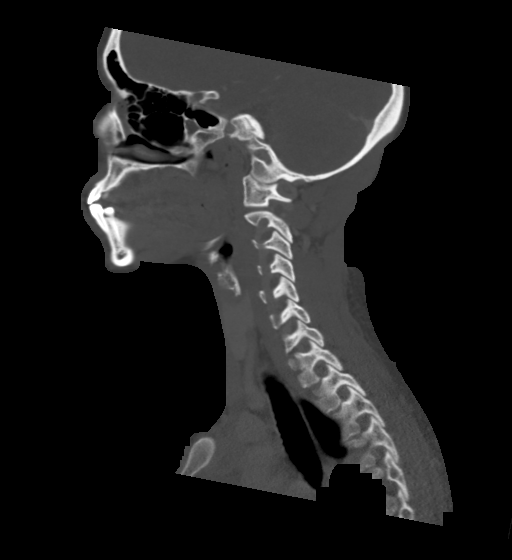
[im 79/118  bone]
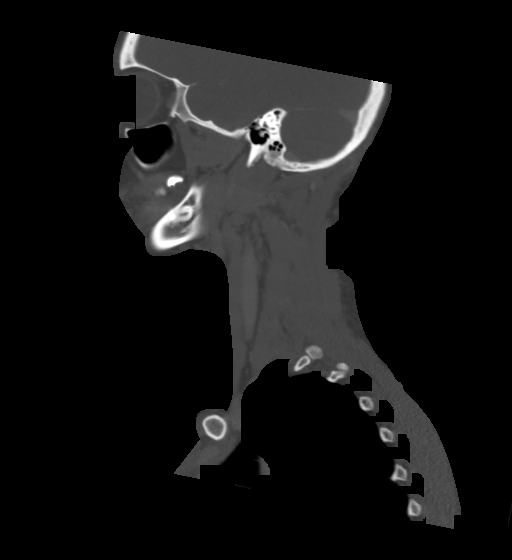

[Series 7: cor neck · coronal · 0.49mm/px · 3 of 99 slices shown]
[im 25/99  bone]
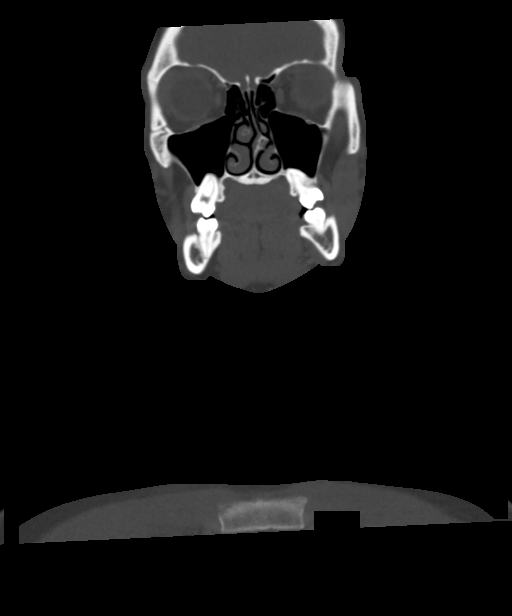
[im 41/99  bone]
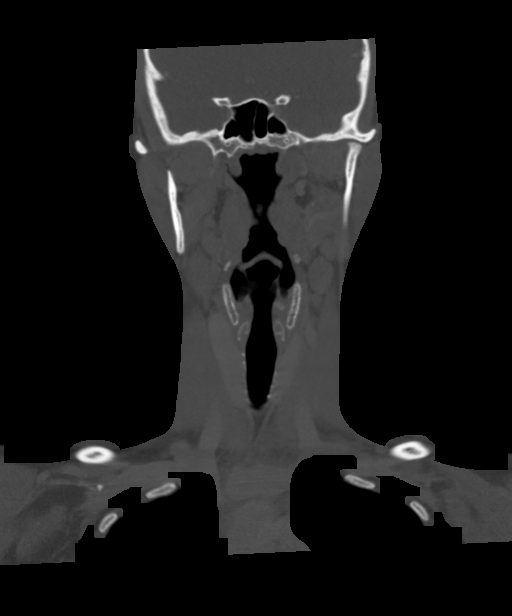
[im 58/99  bone]
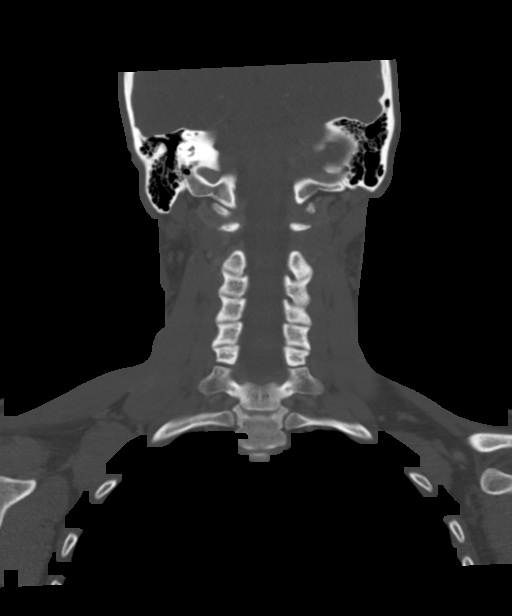

[Series 8: orthogonal ax · axial · 0.48mm/px · z∈[-243,-205]mm · 2 of 118 slices shown]
[im 20/118  bone]
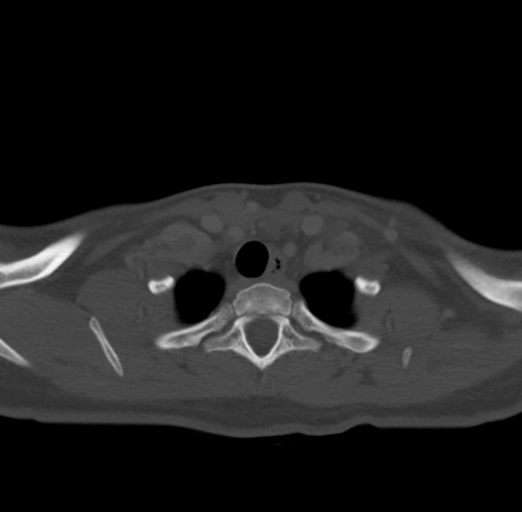
[im 40/118  bone]
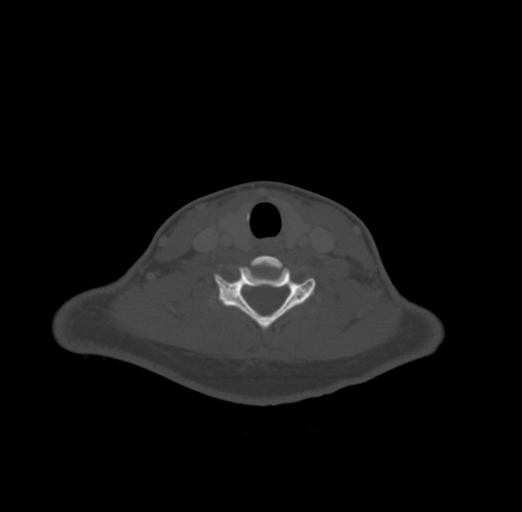

[15 of 33 positions shown; findings below may reference images not displayed]

FINDINGS: Pharynx and larynx: Slight motion artifact at the supraglottic
larynx. The larynx remains normal. Hypopharynx, oropharynx and
nasopharynx contours are normal. Negative parapharyngeal and
retropharyngeal spaces.

Salivary glands: Negative sublingual space. The submandibular glands
appear symmetric and normal, with no submandibular space
inflammation. There is no sialolithiasis. Based on these images I
think a bulbous calcification of the posterior hyoid bone is
responsible for the appearance on the earlier radiographs (series 3,
image 49). The parotid glands appear symmetric and within normal
limits.

Thyroid: Negative.

Lymph nodes: Cervical lymph nodes are symmetric and within normal
limits for age, up to 8 millimeters short axis at the level 2 nodal
stations. No heterogeneous nodes.

Vascular: Major vascular structures in the neck and at the skull
base are patent, and appear normal.

Limited intracranial: Negative.

Visualized orbits: Leftward gaze deviation, otherwise negative.

Mastoids and visualized paranasal sinuses: Clear throughout.

Skeleton: No acute dental finding. Impacted right side mandible
wisdom tooth. No osseous abnormality identified.

Upper chest: Normal lung apices and visible superior mediastinum
with residual thymus.
IMPRESSION: Normal CT appearance of the neck.

I think slightly bulbous calcification of the posterior hyoid bone
was simulating a calculus on the earlier radiographs.

## 2019-10-23 IMAGING — CR NECK SOFT TISSUES - 1+ VIEW
2 series · 2 of 2 positions shown · non-contrast
Comparison: Head CT 06/04/2017.

CLINICAL DATA: 22-year-old female with neck swelling and painful
swallowing x1 week. Left ear pain since yesterday.

EXAM:
NECK SOFT TISSUES - 1+ VIEW

[w soft tissue neck]
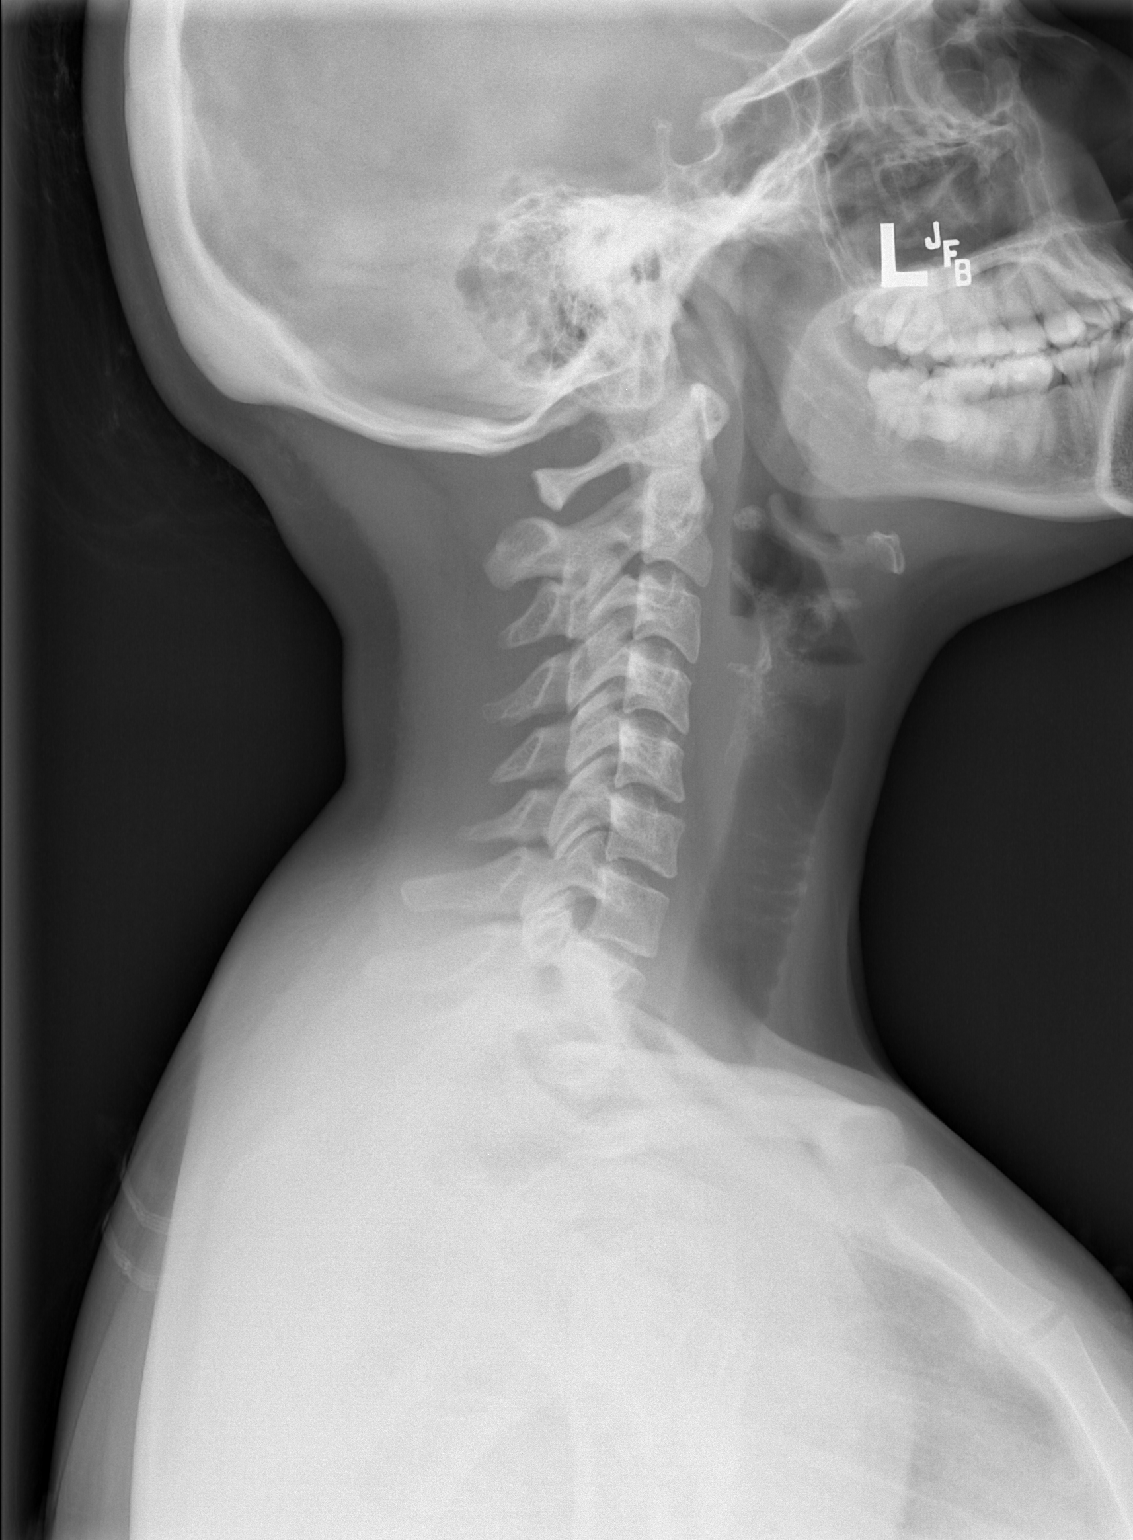

[w soft tissue neck ap]
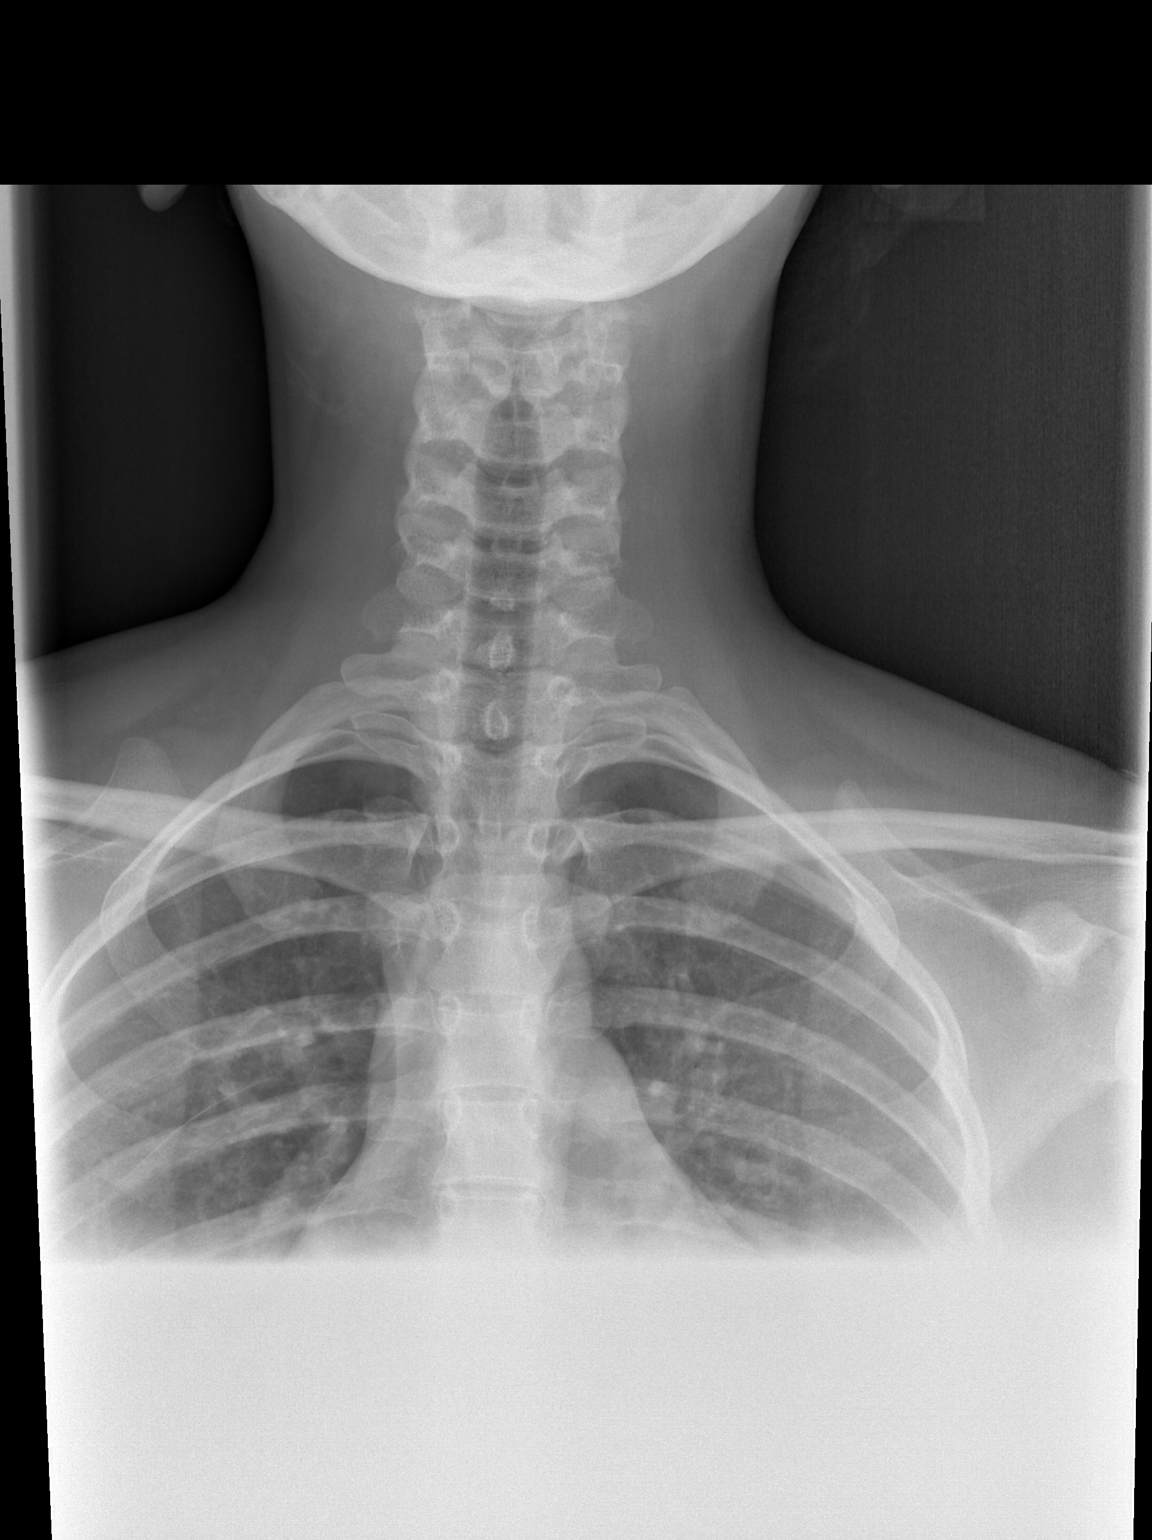

[2 of 2 positions shown; findings below may reference images not displayed]

FINDINGS: Prevertebral soft tissue contours are stable since the scalp view in
4958 and within normal limits. There is a 6 millimeter oval
calcification visible on the lateral view projecting over the
hypopharynx, and this is not correlated on the AP or the prior
study.

Otherwise normal pharyngeal contours. Visualized tracheal air column
is within normal limits. Negative visible chest. No osseous
abnormality identified.
IMPRESSION: 1. A 6 mm oval calcification projecting over the hypopharynx on the
lateral view, not identified on the AP. Perhaps this is a large
submandibular sialolith. Query sialadenitis.
2. Otherwise negative radiographic appearance of the neck.

## 2020-02-17 ENCOUNTER — Encounter (HOSPITAL_BASED_OUTPATIENT_CLINIC_OR_DEPARTMENT_OTHER): Payer: Self-pay | Admitting: *Deleted

## 2020-02-17 ENCOUNTER — Emergency Department (HOSPITAL_BASED_OUTPATIENT_CLINIC_OR_DEPARTMENT_OTHER)
Admission: EM | Admit: 2020-02-17 | Discharge: 2020-02-18 | Disposition: A | Discharge status: Home / Self Care | Payer: 59 | Attending: Emergency Medicine | Admitting: Emergency Medicine

## 2020-02-17 ENCOUNTER — Other Ambulatory Visit: Payer: Self-pay

## 2020-02-17 DIAGNOSIS — N760 Acute vaginitis: Secondary | ICD-10-CM | POA: Insufficient documentation

## 2020-02-17 DIAGNOSIS — Z87891 Personal history of nicotine dependence: Secondary | ICD-10-CM | POA: Diagnosis not present

## 2020-02-17 DIAGNOSIS — L292 Pruritus vulvae: Secondary | ICD-10-CM | POA: Diagnosis present

## 2020-02-17 DIAGNOSIS — J069 Acute upper respiratory infection, unspecified: Secondary | ICD-10-CM | POA: Diagnosis not present

## 2020-02-17 DIAGNOSIS — Z20822 Contact with and (suspected) exposure to covid-19: Secondary | ICD-10-CM | POA: Insufficient documentation

## 2020-02-17 DIAGNOSIS — N898 Other specified noninflammatory disorders of vagina: Secondary | ICD-10-CM

## 2020-02-17 DIAGNOSIS — Z91040 Latex allergy status: Secondary | ICD-10-CM | POA: Diagnosis not present

## 2020-02-17 DIAGNOSIS — B9689 Other specified bacterial agents as the cause of diseases classified elsewhere: Secondary | ICD-10-CM

## 2020-02-17 LAB — RESP PANEL BY RT-PCR (FLU A&B, COVID) ARPGX2
Influenza A by PCR: NEGATIVE
Influenza B by PCR: NEGATIVE
SARS Coronavirus 2 by RT PCR: NEGATIVE

## 2020-02-17 LAB — URINALYSIS, ROUTINE W REFLEX MICROSCOPIC
Bilirubin Urine: NEGATIVE
Glucose, UA: NEGATIVE mg/dL
Hgb urine dipstick: NEGATIVE
Ketones, ur: NEGATIVE mg/dL
Leukocytes,Ua: NEGATIVE
Nitrite: NEGATIVE
Protein, ur: NEGATIVE mg/dL
Specific Gravity, Urine: 1.01 (ref 1.005–1.030)
pH: 7.5 (ref 5.0–8.0)

## 2020-02-17 LAB — PREGNANCY, URINE: Preg Test, Ur: NEGATIVE

## 2020-02-17 MED ORDER — BENZONATATE 200 MG PO CAPS: 200.0000 mg | ORAL_CAPSULE | Freq: Three times a day (TID) | ORAL | 0 refills | Status: AC

## 2020-02-17 NOTE — ED Provider Notes (Signed)
[facility] EMERGENCY DEPARTMENT Provider Note   CSN: [id number] Arrival date & time: 02/17/20  2058     History Chief Complaint  Patient presents with  . Vaginal Itching    Jamie Hansen is a 24 y.o. female.  24 year old female with complaint of vaginal itching after switching soap, reports white vaginal discharge. Denies abdominal pain, concerns for STDs. Also requests COVID test, states cough and exposed to sister who tested positive for COVID 02/11/20. Patient is vaccinated, had a negative COVID test initially but her work requires a test with a doctor's note in order to return to work.         Past Medical History:  Diagnosis Date  . Bipolar 1 disorder (HCC)   . Chlamydia   . Complication of anesthesia    low BP, drowsy  . Depression   . Pregnancy induced hypertension     Patient Active Problem List   Diagnosis Date Noted  . Encounter for sterilization 02/23/2017  . Indication for care in labor and delivery, antepartum 02/22/2017  . History of pre-eclampsia in prior pregnancy, currently pregnant 07/11/2016  . Bipolar disorder, unspecified (HCC) 11/14/2012  . Anxiety 11/14/2012  . History of chlamydia infection 11/14/2012    Past Surgical History:  Procedure Laterality Date  . CESAREAN SECTION    . HERNIA REPAIR    . TUBAL LIGATION Bilateral 02/23/2017   Procedure: POST PARTUM TUBAL LIGATION;  Surgeon: Federico Flake, MD;  Location: [facility] BIRTHING SUITES;  Service: Gynecology;  Laterality: Bilateral;     OB History    Gravida  2   Para  2   Term  2   Preterm      AB      Living  2     SAB      IAB      Ectopic      Multiple  0   Live Births  2           Family History  Problem Relation Age of Onset  . Cancer Maternal Grandmother     Social History   Tobacco Use  . Smoking status: Former Games developer  . Smokeless tobacco: Never Used  Vaping Use  . Vaping Use: Never used  Substance Use Topics  . Alcohol  use: Not Currently    Comment: occ  . Drug use: No    Home Medications Prior to Admission medications   Medication Sig Start Date End Date Taking? Authorizing Provider  amoxicillin-clavulanate (AUGMENTIN) 875-125 MG tablet Take 1 tablet by mouth 2 (two) times daily. One po bid x 7 days 08/27/18   Loren Racer, MD  benzonatate (TESSALON) 200 MG capsule Take 1 capsule (200 mg total) by mouth every 8 (eight) hours for 10 days. 02/17/20 02/27/20  Jeannie Fend, PA-C  cephALEXin (KEFLEX) 500 MG capsule Take 1 capsule (500 mg total) by mouth 4 (four) times daily. 05/26/17   Gwyneth Sprout, MD  guaiFENesin-codeine (ROBITUSSIN AC) 100-10 MG/5ML syrup Take 5 mLs by mouth 3 (three) times daily as needed for cough. 03/10/18   Renne Crigler, PA-C  ibuprofen (ADVIL,MOTRIN) 600 MG tablet Take 1 tablet (600 mg total) by mouth every 6 (six) hours. 02/25/17   Aviva Signs, CNM  metroNIDAZOLE (FLAGYL) 500 MG tablet Take 1 tablet (500 mg total) by mouth 2 (two) times daily for 7 days. 02/18/20 02/25/20  Maia Plan, MD  ondansetron (ZOFRAN ODT) 4 MG disintegrating tablet Take 1 tablet (4 mg  total) by mouth every 8 (eight) hours as needed for nausea or vomiting. 03/23/18   Maxwell Caul, PA-C  predniSONE (STERAPRED UNI-PAK 21 TAB) 10 MG (21) TBPK tablet Take 6 tabs by mouth daily  for 2 days, then 5 tabs for 2 days, then 4 tabs for 2 days, then 3 tabs for 2 days, 2 tabs for 2 days, then 1 tab by mouth daily for 2 days 06/04/17   Jacalyn Lefevre, MD  valACYclovir (VALTREX) 1000 MG tablet Take 1 tablet (1,000 mg total) by mouth 3 (three) times daily. 06/04/17   Jacalyn Lefevre, MD    Allergies    Latex  Review of Systems   Review of Systems  Constitutional: Negative for chills and fever.  HENT: Negative for congestion and sore throat.   Respiratory: Positive for cough.   Gastrointestinal: Negative for abdominal pain.  Genitourinary: Positive for vaginal discharge. Negative for dysuria and frequency.   Musculoskeletal: Negative for arthralgias and myalgias.  Skin: Negative for rash and wound.  Allergic/Immunologic: Negative for immunocompromised state.  Neurological: Negative for weakness.  Hematological: Negative for adenopathy.  All other systems reviewed and are negative.   Physical Exam Updated Vital Signs BP 128/74   Pulse 86   Temp 98 F (36.7 C)   Resp 16   Ht 4\' 11"  (1.499 m)   Wt 54.4 kg   LMP 02/03/2020   SpO2 99%   Breastfeeding No   BMI 24.24 kg/m   Physical Exam Vitals and nursing note reviewed.  Constitutional:      General: She is not in acute distress.    Appearance: She is well-developed and well-nourished. She is not diaphoretic.  HENT:     Head: Normocephalic and atraumatic.     Mouth/Throat:     Mouth: Mucous membranes are moist.     Pharynx: No oropharyngeal exudate or posterior oropharyngeal erythema.  Eyes:     Conjunctiva/sclera: Conjunctivae normal.  Cardiovascular:     Rate and Rhythm: Normal rate and regular rhythm.     Heart sounds: Normal heart sounds.  Pulmonary:     Effort: Pulmonary effort is normal.     Breath sounds: Normal breath sounds.  Abdominal:     Palpations: Abdomen is soft.     Tenderness: There is no abdominal tenderness. There is no right CVA tenderness or left CVA tenderness.  Skin:    General: Skin is warm and dry.     Findings: No erythema or rash.  Neurological:     Mental Status: She is alert and oriented to person, place, and time.  Psychiatric:        Mood and Affect: Mood and affect normal.        Behavior: Behavior normal.     ED Results / Procedures / Treatments   Labs (all labs ordered are listed, but only abnormal results are displayed) Labs Reviewed  WET PREP, GENITAL - Abnormal; Notable for the following components:      Result Value   Clue Cells Wet Prep HPF POC PRESENT (*)    WBC, Wet Prep HPF POC FEW (*)    All other components within normal limits  RESP PANEL BY RT-PCR (FLU A&B, COVID)  ARPGX2  URINALYSIS, ROUTINE W REFLEX MICROSCOPIC  PREGNANCY, URINE    EKG None  Radiology No results found.  Procedures Procedures (including critical care time)  Medications Ordered in ED Medications - No data to display  ED Course  I have reviewed the triage vital signs  and the nursing notes.  Pertinent labs & imaging results that were available during my care of the patient were reviewed by me and considered in my medical decision making (see chart for details).  Clinical Course as of 02/18/20 1559  Sun Feb 17, 2020  3929 24 year old female with complaint of vaginal discharge with itching without abdominal/pelvic pain or fever or concerns for STDs, plan is to self collect wet prep. Also requests COVID test, has cough, exposed to sister who is positive.  [LM]  Mon Feb 18, 2020  1559 Wet prep positive for clue cells, patient given flagyl for BV.  [LM]    Clinical Course User Index [LM] Alden Hipp   MDM Rules/Calculators/A&P                          Final Clinical Impression(s) / ED Diagnoses Final diagnoses:  Vaginal itching  Viral URI with cough  Bacterial vaginosis    Rx / DC Orders ED Discharge Orders         Ordered    metroNIDAZOLE (FLAGYL) 500 MG tablet  2 times daily        02/18/20 0011    benzonatate (TESSALON) 200 MG capsule  Every 8 hours        02/17/20 2257           Jeannie Fend, PA-C 02/18/20 1559    Melene Plan, DO 02/18/20 1940

## 2020-02-17 NOTE — Discharge Instructions (Addendum)
You were seen in the emergency department today with cough as well as some vaginal itching.  You do have some bacterial vaginosis and I am starting an antibiotic.

## 2020-02-17 NOTE — ED Notes (Signed)
Pelvic cart at bedside. 

## 2020-02-17 NOTE — ED Triage Notes (Addendum)
Pt reports she has has vaginal discharge and itching x 2 days. She had a negative covid test on 12/21 and states her job is requiring her to have another to return to work. She has had a cough x 1 week. She has been vaccinated against covid but her sister tested covid + on 12/20. She also reports she had Covid in either October or November of this year

## 2020-02-18 LAB — WET PREP, GENITAL
Sperm: NONE SEEN
Trich, Wet Prep: NONE SEEN
Yeast Wet Prep HPF POC: NONE SEEN

## 2020-02-18 MED ORDER — METRONIDAZOLE 500 MG PO TABS: 500.0000 mg | ORAL_TABLET | Freq: Two times a day (BID) | ORAL | 0 refills | Status: AC

## 2020-04-30 ENCOUNTER — Encounter (HOSPITAL_BASED_OUTPATIENT_CLINIC_OR_DEPARTMENT_OTHER): Payer: Self-pay

## 2020-04-30 ENCOUNTER — Other Ambulatory Visit: Payer: Self-pay

## 2020-04-30 ENCOUNTER — Emergency Department (HOSPITAL_BASED_OUTPATIENT_CLINIC_OR_DEPARTMENT_OTHER)
Admission: EM | Admit: 2020-04-30 | Discharge: 2020-04-30 | Disposition: A | Discharge status: Home / Self Care | Payer: 59 | Attending: Emergency Medicine | Admitting: Emergency Medicine

## 2020-04-30 DIAGNOSIS — N898 Other specified noninflammatory disorders of vagina: Secondary | ICD-10-CM | POA: Diagnosis not present

## 2020-04-30 DIAGNOSIS — Z87891 Personal history of nicotine dependence: Secondary | ICD-10-CM | POA: Insufficient documentation

## 2020-04-30 DIAGNOSIS — Z91040 Latex allergy status: Secondary | ICD-10-CM | POA: Insufficient documentation

## 2020-04-30 LAB — WET PREP, GENITAL
Clue Cells Wet Prep HPF POC: NONE SEEN
Sperm: NONE SEEN
Trich, Wet Prep: NONE SEEN
Yeast Wet Prep HPF POC: NONE SEEN

## 2020-04-30 LAB — URINALYSIS, ROUTINE W REFLEX MICROSCOPIC
Bilirubin Urine: NEGATIVE
Glucose, UA: NEGATIVE mg/dL
Hgb urine dipstick: NEGATIVE
Ketones, ur: NEGATIVE mg/dL
Leukocytes,Ua: NEGATIVE
Nitrite: NEGATIVE
Protein, ur: NEGATIVE mg/dL
Specific Gravity, Urine: 1.015 (ref 1.005–1.030)
pH: 6.5 (ref 5.0–8.0)

## 2020-04-30 LAB — PREGNANCY, URINE: Preg Test, Ur: NEGATIVE

## 2020-04-30 NOTE — ED Provider Notes (Signed)
[facility] EMERGENCY DEPARTMENT Provider Note   CSN: [id number] Arrival date & time: 04/30/20  1059     History Chief Complaint  Patient presents with  . Vaginal Discharge    Jamie Hansen is a 24 y.o. female.   Vaginal Discharge Quality:  Milky Severity:  Moderate Onset quality:  Gradual Duration:  2 days Timing:  Constant Progression:  Worsening Chronicity:  Recurrent Context comment:  Hx of BV Relieved by:  Nothing Worsened by:  Nothing Ineffective treatments:  None tried Associated symptoms: abdominal pain   Associated symptoms: no dysuria, no fever, no nausea, no urinary incontinence, no vaginal itching and no vomiting        Past Medical History:  Diagnosis Date  . Bipolar 1 disorder (HCC)   . Chlamydia   . Complication of anesthesia    low BP, drowsy  . Depression   . Pregnancy induced hypertension     Patient Active Problem List   Diagnosis Date Noted  . Encounter for sterilization 02/23/2017  . Indication for care in labor and delivery, antepartum 02/22/2017  . History of pre-eclampsia in prior pregnancy, currently pregnant 07/11/2016  . Bipolar disorder, unspecified (HCC) 11/14/2012  . Anxiety 11/14/2012  . History of chlamydia infection 11/14/2012    Past Surgical History:  Procedure Laterality Date  . CESAREAN SECTION    . HERNIA REPAIR    . TUBAL LIGATION Bilateral 02/23/2017   Procedure: POST PARTUM TUBAL LIGATION;  Surgeon: Federico Flake, MD;  Location: [facility] BIRTHING SUITES;  Service: Gynecology;  Laterality: Bilateral;     OB History    Gravida  2   Para  2   Term  2   Preterm      AB      Living  2     SAB      IAB      Ectopic      Multiple  0   Live Births  2           Family History  Problem Relation Age of Onset  . Cancer Maternal Grandmother     Social History   Tobacco Use  . Smoking status: Former Games developer  . Smokeless tobacco: Never Used  Vaping Use  . Vaping Use:  Never used  Substance Use Topics  . Alcohol use: Not Currently    Comment: occ  . Drug use: No    Home Medications Prior to Admission medications   Medication Sig Start Date End Date Taking? Authorizing Provider  amoxicillin-clavulanate (AUGMENTIN) 875-125 MG tablet Take 1 tablet by mouth 2 (two) times daily. One po bid x 7 days 08/27/18   Loren Racer, MD  cephALEXin (KEFLEX) 500 MG capsule Take 1 capsule (500 mg total) by mouth 4 (four) times daily. 05/26/17   Gwyneth Sprout, MD  guaiFENesin-codeine (ROBITUSSIN AC) 100-10 MG/5ML syrup Take 5 mLs by mouth 3 (three) times daily as needed for cough. 03/10/18   Renne Crigler, PA-C  ibuprofen (ADVIL,MOTRIN) 600 MG tablet Take 1 tablet (600 mg total) by mouth every 6 (six) hours. 02/25/17   Aviva Signs, CNM  ondansetron (ZOFRAN ODT) 4 MG disintegrating tablet Take 1 tablet (4 mg total) by mouth every 8 (eight) hours as needed for nausea or vomiting. 03/23/18   Maxwell Caul, PA-C  predniSONE (STERAPRED UNI-PAK 21 TAB) 10 MG (21) TBPK tablet Take 6 tabs by mouth daily  for 2 days, then 5 tabs for 2 days, then 4 tabs for 2  days, then 3 tabs for 2 days, 2 tabs for 2 days, then 1 tab by mouth daily for 2 days 06/04/17   Jacalyn Lefevre, MD  valACYclovir (VALTREX) 1000 MG tablet Take 1 tablet (1,000 mg total) by mouth 3 (three) times daily. 06/04/17   Jacalyn Lefevre, MD    Allergies    Latex  Review of Systems   Review of Systems  Constitutional: Negative for chills and fever.  HENT: Negative for congestion and rhinorrhea.   Respiratory: Negative for cough and shortness of breath.   Cardiovascular: Negative for chest pain and palpitations.  Gastrointestinal: Positive for abdominal pain. Negative for diarrhea, nausea and vomiting.  Genitourinary: Positive for pelvic pain, vaginal discharge and vaginal pain. Negative for bladder incontinence, difficulty urinating, dysuria and vaginal bleeding.  Musculoskeletal: Negative for arthralgias  and back pain.  Skin: Negative for rash and wound.  Neurological: Negative for light-headedness and headaches.    Physical Exam Updated Vital Signs BP 134/90 (BP Location: Right Arm)   Pulse 78   Temp 98.2 F (36.8 C) (Oral)   Resp 18   Ht 4\' 11"  (1.499 m)   Wt 52.6 kg   LMP 04/14/2020   SpO2 100%   BMI 23.43 kg/m   Physical Exam Vitals and nursing note reviewed. Exam conducted with a chaperone present.  Constitutional:      General: She is not in acute distress.    Appearance: Normal appearance.  HENT:     Head: Normocephalic and atraumatic.     Nose: No rhinorrhea.  Eyes:     General:        Right eye: No discharge.        Left eye: No discharge.     Conjunctiva/sclera: Conjunctivae normal.  Cardiovascular:     Rate and Rhythm: Normal rate and regular rhythm.  Pulmonary:     Effort: Pulmonary effort is normal. No respiratory distress.     Breath sounds: No stridor.  Abdominal:     General: Abdomen is flat. There is no distension.     Palpations: Abdomen is soft.     Tenderness: There is no abdominal tenderness. There is no guarding.  Genitourinary:    Vagina: No vaginal discharge.     Cervix: No cervical motion tenderness, discharge, friability or erythema.     Uterus: Normal. Not deviated.      Adnexa: Right adnexa normal and left adnexa normal.  Musculoskeletal:        General: No tenderness or signs of injury.  Skin:    General: Skin is warm and dry.  Neurological:     General: No focal deficit present.     Mental Status: She is alert. Mental status is at baseline.     Motor: No weakness.  Psychiatric:        Mood and Affect: Mood normal.        Behavior: Behavior normal.     ED Results / Procedures / Treatments   Labs (all labs ordered are listed, but only abnormal results are displayed) Labs Reviewed  WET PREP, GENITAL - Abnormal; Notable for the following components:      Result Value   WBC, Wet Prep HPF POC FEW (*)    All other components  within normal limits  PREGNANCY, URINE  URINALYSIS, ROUTINE W REFLEX MICROSCOPIC  GC/CHLAMYDIA PROBE AMP ([place]) NOT AT [facility]    EKG None  Radiology No results found.  Procedures Procedures   Medications Ordered in ED Medications -  No data to display  ED Course  I have reviewed the triage vital signs and the nursing notes.  Pertinent labs & imaging results that were available during my care of the patient were reviewed by me and considered in my medical decision making (see chart for details).    MDM Rules/Calculators/A&P                          Vaginal discharge, abnormal smell, history of BV.  No at risk sexual encounters.  We will test for UTI will check for pregnancy will do pelvic exam and swab.  Patient overall well-appearing with a benign abdomen normal vital signs.  Pelvic exam fairly unremarkable and will wait on wet prep.  Wet prep only with white blood cells.  Seems possible that this is bacterial vaginosis still.  Offered treatment for it she declined.  Offered treatment for gonorrhea chlamydia as well.  She declined we will wait for testing.  She is given outpatient follow-up instructions and return precautions.   Final Clinical Impression(s) / ED Diagnoses Final diagnoses:  Vaginal discharge    Rx / DC Orders ED Discharge Orders    None       Sabino Donovan, MD 04/30/20 1240

## 2020-04-30 NOTE — Discharge Instructions (Addendum)
Look for call from [place] in the next 24 to 40 hours if you have a positive result.  If that is the case we will call you and tell you how to get treated.  Return to [place] with any concerning signs or symptoms.

## 2020-04-30 NOTE — ED Triage Notes (Signed)
Pt complaining of white vaginal discharge with "fishy" odor starting yesterday. Also complaining of bilateral abdominal pain, states it feels like "ovarian pain". Denies vaginal itching/pain. Denies urinary symptoms. Denies concern for STD, is sexually active  Pt states does not want to use interpreter during triage.

## 2020-05-01 LAB — GC/CHLAMYDIA PROBE AMP (~~LOC~~) NOT AT ARMC
Chlamydia: NEGATIVE
Comment: NEGATIVE
Comment: NORMAL
Neisseria Gonorrhea: NEGATIVE

## 2020-05-09 ENCOUNTER — Telehealth (HOSPITAL_COMMUNITY): Payer: Self-pay

## 2020-08-28 ENCOUNTER — Encounter (HOSPITAL_BASED_OUTPATIENT_CLINIC_OR_DEPARTMENT_OTHER): Payer: Self-pay | Admitting: Emergency Medicine

## 2020-08-28 ENCOUNTER — Emergency Department (HOSPITAL_BASED_OUTPATIENT_CLINIC_OR_DEPARTMENT_OTHER)
Admission: EM | Admit: 2020-08-28 | Discharge: 2020-08-28 | Disposition: A | Discharge status: Home / Self Care | Payer: Medicaid Other | Attending: Emergency Medicine | Admitting: Emergency Medicine

## 2020-08-28 ENCOUNTER — Other Ambulatory Visit: Payer: Self-pay

## 2020-08-28 DIAGNOSIS — R5383 Other fatigue: Secondary | ICD-10-CM | POA: Insufficient documentation

## 2020-08-28 DIAGNOSIS — Z87891 Personal history of nicotine dependence: Secondary | ICD-10-CM | POA: Diagnosis not present

## 2020-08-28 DIAGNOSIS — Z20822 Contact with and (suspected) exposure to covid-19: Secondary | ICD-10-CM | POA: Diagnosis not present

## 2020-08-28 DIAGNOSIS — Z91040 Latex allergy status: Secondary | ICD-10-CM | POA: Diagnosis not present

## 2020-08-28 DIAGNOSIS — R1010 Upper abdominal pain, unspecified: Secondary | ICD-10-CM | POA: Diagnosis not present

## 2020-08-28 DIAGNOSIS — R112 Nausea with vomiting, unspecified: Secondary | ICD-10-CM | POA: Diagnosis present

## 2020-08-28 DIAGNOSIS — R197 Diarrhea, unspecified: Secondary | ICD-10-CM | POA: Insufficient documentation

## 2020-08-28 LAB — URINALYSIS, MICROSCOPIC (REFLEX)

## 2020-08-28 LAB — URINALYSIS, ROUTINE W REFLEX MICROSCOPIC
Bilirubin Urine: NEGATIVE
Glucose, UA: NEGATIVE mg/dL
Ketones, ur: NEGATIVE mg/dL
Leukocytes,Ua: NEGATIVE
Nitrite: NEGATIVE
Protein, ur: NEGATIVE mg/dL
Specific Gravity, Urine: 1.025 (ref 1.005–1.030)
pH: 5 (ref 5.0–8.0)

## 2020-08-28 LAB — RESP PANEL BY RT-PCR (FLU A&B, COVID) ARPGX2
Influenza A by PCR: NEGATIVE
Influenza B by PCR: NEGATIVE
SARS Coronavirus 2 by RT PCR: NEGATIVE

## 2020-08-28 MED ORDER — ONDANSETRON 4 MG PO TBDP
4.0000 mg | ORAL_TABLET | Freq: Once | ORAL | Status: AC
  Administered 2020-08-28: 4 mg via ORAL
  Filled 2020-08-28: qty 1

## 2020-08-28 MED ORDER — ONDANSETRON 4 MG PO TBDP: 4.0000 mg | ORAL_TABLET | Freq: Three times a day (TID) | ORAL | 0 refills | Status: DC | PRN

## 2020-08-28 NOTE — ED Provider Notes (Signed)
[facility] EMERGENCY DEPARTMENT Provider Note   CSN: [id number] Arrival date & time: 08/28/20  1306     History Chief Complaint  Patient presents with   Abdominal Pain    Jamie Hansen is a 25 y.o. female.  The history is provided by the patient.  Abdominal Pain Associated symptoms: diarrhea, fatigue, nausea and vomiting   Associated symptoms: no chest pain, no cough, no fever and no shortness of breath   Patient presents with nausea vomiting diarrhea.  Began last night.  States couple episodes last night.  States nausea and really not hungry.  States anything she tries to eat comes up.  No fevers.  States she does have some mild upper abdominal pain.  States her mother and son have similar symptoms.  Patient later informed the nurse that her mother also tested positive at home for COVID.  Denies possibly of pregnancy.  Does not want a pregnancy test.  Patient requested to do the inner view in [place] instead of with a Engineer, structural.    Past Medical History:  Diagnosis Date   Bipolar 1 disorder (HCC)    Chlamydia    Complication of anesthesia    low BP, drowsy   Depression    Pregnancy induced hypertension     Patient Active Problem List   Diagnosis Date Noted   Encounter for sterilization 02/23/2017   Indication for care in labor and delivery, antepartum 02/22/2017   History of pre-eclampsia in prior pregnancy, currently pregnant 07/11/2016   Bipolar disorder, unspecified (HCC) 11/14/2012   Anxiety 11/14/2012   History of chlamydia infection 11/14/2012    Past Surgical History:  Procedure Laterality Date   CESAREAN SECTION     HERNIA REPAIR     TUBAL LIGATION Bilateral 02/23/2017   Procedure: POST PARTUM TUBAL LIGATION;  Surgeon: Federico Flake, MD;  Location: [facility] BIRTHING SUIT[facility];  Service: Gynecology;  Laterality: Bilateral;     OB History     Gravida  2   Para  2   Term  2   Preterm      AB      Living  2      SAB       IAB      Ectopic      Multiple  0   Live Births  2           Family History  Problem Relation Age of Onset   Cancer Maternal Grandmother     Social History   Tobacco Use   Smoking status: Former    Pack years: 0.00   Smokeless tobacco: Never  Vaping Use   Vaping Use: Never used  Substance Use Topics   Alcohol use: Not Currently    Comment: occ   Drug use: No    Home Medications Prior to Admission medications   Medication Sig Start Date End Date Taking? Authorizing Provider  ondansetron (ZOFRAN-ODT) 4 MG disintegrating tablet Take 1 tablet (4 mg total) by mouth every 8 (eight) hours as needed for nausea or vomiting. 08/28/20  Yes Benjiman Core, MD  amoxicillin-clavulanate (AUGMENTIN) 875-125 MG tablet Take 1 tablet by mouth 2 (two) times daily. One po bid x 7 days 08/27/18   Loren Racer, MD  cephALEXin (KEFLEX) 500 MG capsule Take 1 capsule (500 mg total) by mouth 4 (four) times daily. 05/26/17   Gwyneth Sprout, MD  guaiFENesin-codeine (ROBITUSSIN AC) 100-10 MG/5ML syrup Take 5 mLs by mouth 3 (three) times daily as needed  for cough. 03/10/18   Renne Crigler, PA-C  ibuprofen (ADVIL,MOTRIN) 600 MG tablet Take 1 tablet (600 mg total) by mouth every 6 (six) hours. 02/25/17   Aviva Signs, CNM  predniSONE (STERAPRED UNI-PAK 21 TAB) 10 MG (21) TBPK tablet Take 6 tabs by mouth daily  for 2 days, then 5 tabs for 2 days, then 4 tabs for 2 days, then 3 tabs for 2 days, 2 tabs for 2 days, then 1 tab by mouth daily for 2 days 06/04/17   Jacalyn Lefevre, MD  valACYclovir (VALTREX) 1000 MG tablet Take 1 tablet (1,000 mg total) by mouth 3 (three) times daily. 06/04/17   Jacalyn Lefevre, MD    Allergies    Latex  Review of Systems   Review of Systems  Constitutional:  Positive for appetite change and fatigue. Negative for fever.  HENT:  Negative for congestion.   Respiratory:  Negative for cough and shortness of breath.   Cardiovascular:  Negative for chest pain.   Gastrointestinal:  Positive for abdominal pain, diarrhea, nausea and vomiting.  Genitourinary:  Negative for flank pain.  Musculoskeletal:  Negative for back pain.  Skin:  Negative for rash.  Neurological:  Negative for weakness.  Psychiatric/Behavioral:  Negative for confusion.    Physical Exam Updated Vital Signs BP 113/61   Pulse 60   Temp 98.2 F (36.8 C) (Oral)   Resp 16   Ht 4\' 11"  (1.499 m)   Wt 54.4 kg   LMP 08/21/2020 (Approximate)   SpO2 100%   BMI 24.24 kg/m   Physical Exam Vitals and nursing note reviewed.  HENT:     Head: Normocephalic.  Cardiovascular:     Rate and Rhythm: Regular rhythm.  Pulmonary:     Effort: Pulmonary effort is normal.     Breath sounds: Normal breath sounds.  Abdominal:     Tenderness: There is no abdominal tenderness.     Hernia: No hernia is present.  Skin:    General: Skin is warm.     Capillary Refill: Capillary refill takes less than 2 seconds.  Neurological:     Mental Status: She is alert and oriented to person, place, and time.  Psychiatric:        Behavior: Behavior normal.    ED Results / Procedures / Treatments   Labs (all labs ordered are listed, but only abnormal results are displayed) Labs Reviewed  URINALYSIS, ROUTINE W REFLEX MICROSCOPIC - Abnormal; Notable for the following components:      Result Value   Hgb urine dipstick SMALL (*)    All other components within normal limits  URINALYSIS, MICROSCOPIC (REFLEX) - Abnormal; Notable for the following components:   Bacteria, UA FEW (*)    All other components within normal limits  RESP PANEL BY RT-PCR (FLU A&B, COVID) ARPGX2    EKG None  Radiology No results found.  Procedures Procedures   Medications Ordered in ED Medications  ondansetron (ZOFRAN-ODT) disintegrating tablet 4 mg (4 mg Oral Given 08/28/20 1654)    ED Course  I have reviewed the triage vital signs and the nursing notes.  Pertinent labs & imaging results that were available during  my care of the patient were reviewed by me and considered in my medical decision making (see chart for details).    MDM Rules/Calculators/A&P                          Patient with nausea vomiting diarrhea.  Mild abdominal pain.  Benign exam.  Son and mother have similar symptoms.  Later informed that patient's mother tested positive for COVID 2 days ago.  COVID test here negative although potentially could be too early and is a false negative.  Tolerate orals.  Urine reassuring.  Will discharge home.  Denies pregnancy testing.  Discharge home.  Most likely a viral gastroenteritis particularly with family numbers with similar symptoms. Final Clinical Impression(s) / ED Diagnoses Final diagnoses:  Nausea vomiting and diarrhea    Rx / DC Orders ED Discharge Orders          Ordered    ondansetron (ZOFRAN-ODT) 4 MG disintegrating tablet  Every 8 hours PRN        08/28/20 Thedora Hinders, MD 08/28/20 1823

## 2020-08-28 NOTE — ED Notes (Signed)
Pt drinking sprite while walking to room, ambulatory without distress. Muffin in hand. No vomiting noted

## 2020-08-28 NOTE — ED Notes (Signed)
Pt informs this RN that her mother tested positive for COVID 2 days ago with home covid test. States her mother and son have similar symptoms

## 2020-08-28 NOTE — Discharge Instructions (Addendum)
Your COVID test was negative although it could be a little early and a false negative.  Try and keep yourself hydrated.  The nausea medicine should help.

## 2020-08-28 NOTE — ED Triage Notes (Signed)
Reports N/V/D and abd pain since last night at 3AM.

## 2020-10-23 ENCOUNTER — Other Ambulatory Visit (HOSPITAL_COMMUNITY): Payer: Self-pay | Admitting: Family Medicine

## 2020-10-23 ENCOUNTER — Other Ambulatory Visit: Payer: Self-pay | Admitting: Family Medicine

## 2020-10-23 DIAGNOSIS — R102 Pelvic and perineal pain: Secondary | ICD-10-CM

## 2020-11-05 ENCOUNTER — Encounter (HOSPITAL_COMMUNITY): Payer: Self-pay

## 2020-11-05 ENCOUNTER — Ambulatory Visit (HOSPITAL_COMMUNITY): Admission: RE | Admit: 2020-11-05 | Payer: 59 | Source: Ambulatory Visit

## 2021-04-27 ENCOUNTER — Emergency Department (HOSPITAL_BASED_OUTPATIENT_CLINIC_OR_DEPARTMENT_OTHER)
Admission: EM | Admit: 2021-04-27 | Discharge: 2021-04-27 | Disposition: A | Discharge status: Home / Self Care | Payer: BC Managed Care – PPO | Attending: Emergency Medicine | Admitting: Emergency Medicine

## 2021-04-27 ENCOUNTER — Other Ambulatory Visit: Payer: Self-pay

## 2021-04-27 ENCOUNTER — Encounter (HOSPITAL_BASED_OUTPATIENT_CLINIC_OR_DEPARTMENT_OTHER): Payer: Self-pay | Admitting: *Deleted

## 2021-04-27 DIAGNOSIS — R0981 Nasal congestion: Secondary | ICD-10-CM | POA: Diagnosis not present

## 2021-04-27 DIAGNOSIS — Z0000 Encounter for general adult medical examination without abnormal findings: Secondary | ICD-10-CM

## 2021-04-27 DIAGNOSIS — R252 Cramp and spasm: Secondary | ICD-10-CM | POA: Insufficient documentation

## 2021-04-27 DIAGNOSIS — Z0001 Encounter for general adult medical examination with abnormal findings: Secondary | ICD-10-CM | POA: Insufficient documentation

## 2021-04-27 DIAGNOSIS — Z91040 Latex allergy status: Secondary | ICD-10-CM | POA: Insufficient documentation

## 2021-04-27 LAB — CBC WITH DIFFERENTIAL/PLATELET
Abs Immature Granulocytes: 0.04 10*3/uL (ref 0.00–0.07)
Basophils Absolute: 0 10*3/uL (ref 0.0–0.1)
Basophils Relative: 0 %
Eosinophils Absolute: 0.2 10*3/uL (ref 0.0–0.5)
Eosinophils Relative: 2 %
HCT: 43.2 % (ref 36.0–46.0)
Hemoglobin: 15.3 g/dL — ABNORMAL HIGH (ref 12.0–15.0)
Immature Granulocytes: 1 %
Lymphocytes Relative: 30 %
Lymphs Abs: 2.6 10*3/uL (ref 0.7–4.0)
MCH: 28.7 pg (ref 26.0–34.0)
MCHC: 35.4 g/dL (ref 30.0–36.0)
MCV: 80.9 fL (ref 80.0–100.0)
Monocytes Absolute: 0.3 10*3/uL (ref 0.1–1.0)
Monocytes Relative: 4 %
Neutro Abs: 5.5 10*3/uL (ref 1.7–7.7)
Neutrophils Relative %: 63 %
Platelets: 189 10*3/uL (ref 150–400)
RBC: 5.34 MIL/uL — ABNORMAL HIGH (ref 3.87–5.11)
RDW: 13.3 % (ref 11.5–15.5)
Smear Review: ADEQUATE
WBC: 8.7 10*3/uL (ref 4.0–10.5)
nRBC: 0 % (ref 0.0–0.2)

## 2021-04-27 LAB — URINALYSIS, ROUTINE W REFLEX MICROSCOPIC
Bilirubin Urine: NEGATIVE
Glucose, UA: NEGATIVE mg/dL
Hgb urine dipstick: NEGATIVE
Ketones, ur: NEGATIVE mg/dL
Leukocytes,Ua: NEGATIVE
Nitrite: NEGATIVE
Protein, ur: NEGATIVE mg/dL
Specific Gravity, Urine: 1.02 (ref 1.005–1.030)
pH: 5.5 (ref 5.0–8.0)

## 2021-04-27 LAB — COMPREHENSIVE METABOLIC PANEL
ALT: 38 U/L (ref 0–44)
AST: 24 U/L (ref 15–41)
Albumin: 4.6 g/dL (ref 3.5–5.0)
Alkaline Phosphatase: 54 U/L (ref 38–126)
Anion gap: 13 (ref 5–15)
BUN: 14 mg/dL (ref 6–20)
CO2: 23 mmol/L (ref 22–32)
Calcium: 9.8 mg/dL (ref 8.9–10.3)
Chloride: 101 mmol/L (ref 98–111)
Creatinine, Ser: 0.7 mg/dL (ref 0.44–1.00)
GFR, Estimated: >60 mL/min (ref >60)
Glucose, Bld: 102 mg/dL — ABNORMAL HIGH (ref 70–99)
Potassium: 3.8 mmol/L (ref 3.5–5.1)
Sodium: 137 mmol/L (ref 135–145)
Total Bilirubin: 1.1 mg/dL (ref 0.3–1.2)
Total Protein: 8.3 g/dL — ABNORMAL HIGH (ref 6.5–8.1)

## 2021-04-27 LAB — PREGNANCY, URINE: Preg Test, Ur: NEGATIVE

## 2021-04-27 NOTE — ED Provider Notes (Signed)
?[facility] EMERGENCY DEPARTMENT ?Provider Note ? ? ?CSN: [id number] ?Arrival date & time: 04/27/21  1246 ? ?  ? ?History ? ?Chief Complaint  ?Patient presents with  ? Leg Pain  ? ? ?Jamie Hansen is a 26 y.o. female. ? ?HPI ? ?26 year old primarily Spanish-speaking patient presents emergency department after episode of leg cramping.  Patient states that this happens periodically every 3 to 4 months.  Interpreter services used.  She called her mom who believes that when she was a young child or baby that she could have tested positive for sickle cell disease.  Patient has never had appropriate testing or have outpatient follow-up.  Currently she is feeling improved and asymptomatic.  Denies any acute symptoms like chest pain, shortness of breath, fever.  She has some mild congestion but her child is also sick.  She is currently requesting sickle cell testing. ? ?Home Medications ?Prior to Admission medications   ?Medication Sig Start Date End Date Taking? Authorizing Provider  ?amoxicillin-clavulanate (AUGMENTIN) 875-125 MG tablet Take 1 tablet by mouth 2 (two) times daily. One po bid x 7 days 08/27/18   Loren Racer, MD  ?cephALEXin (KEFLEX) 500 MG capsule Take 1 capsule (500 mg total) by mouth 4 (four) times daily. 05/26/17   Gwyneth Sprout, MD  ?guaiFENesin-codeine [facility]) 100-10 MG/5ML syrup Take 5 mLs by mouth 3 (three) times daily as needed for cough. 03/10/18   Renne Crigler, PA-C  ?ibuprofen (ADVIL,MOTRIN) 600 MG tablet Take 1 tablet (600 mg total) by mouth every 6 (six) hours. 02/25/17   Aviva Signs, CNM  ?ondansetron (ZOFRAN-ODT) 4 MG disintegrating tablet Take 1 tablet (4 mg total) by mouth every 8 (eight) hours as needed for nausea or vomiting. 08/28/20   Benjiman Core, MD  ?predniSONE (STERAPRED UNI-PAK 21 TAB) 10 MG (21) TBPK tablet Take 6 tabs by mouth daily  for 2 days, then 5 tabs for 2 days, then 4 tabs for 2 days, then 3 tabs for 2 days, 2 tabs for 2 days, then 1  tab by mouth daily for 2 days 06/04/17   Jacalyn Lefevre, MD  ?valACYclovir (VALTREX) 1000 MG tablet Take 1 tablet (1,000 mg total) by mouth 3 (three) times daily. 06/04/17   Jacalyn Lefevre, MD  ?   ? ?Allergies    ?Latex   ? ?Review of Systems   ?Review of Systems  ?Constitutional:  Negative for fever.  ?Respiratory:  Negative for shortness of breath.   ?Cardiovascular:  Negative for chest pain.  ?Gastrointestinal:  Negative for abdominal pain, diarrhea and vomiting.  ?Musculoskeletal:  Positive for myalgias. Negative for back pain and neck pain.  ?Skin:  Negative for rash.  ?Neurological:  Negative for headaches.  ? ?Physical Exam ?Updated Vital Signs ?BP (!) 123/94 (BP Location: Left Arm)   Pulse 66   Temp 98.2 ?F (36.8 ?C) (Oral)   Resp 18   Ht 4\' 11"  (1.499 m)   Wt 58.1 kg   LMP 04/20/2021   SpO2 100%   BMI 25.85 kg/m?  ?Physical Exam ?Vitals and nursing note reviewed.  ?Constitutional:   ?   Appearance: Normal appearance.  ?HENT:  ?   Head: Normocephalic.  ?   Mouth/Throat:  ?   Mouth: Mucous membranes are moist.  ?Cardiovascular:  ?   Rate and Rhythm: Normal rate.  ?Pulmonary:  ?   Effort: Pulmonary effort is normal. No respiratory distress.  ?Abdominal:  ?   Palpations: Abdomen is soft.  ?   Tenderness:  There is no abdominal tenderness.  ?Musculoskeletal:     ?   General: No swelling, tenderness or deformity.  ?   Right lower leg: No edema.  ?   Left lower leg: No edema.  ?Skin: ?   General: Skin is warm.  ?   Coloration: Skin is not jaundiced or pale.  ?   Findings: No bruising or erythema.  ?Neurological:  ?   Mental Status: She is alert and oriented to person, place, and time. Mental status is at baseline.  ?Psychiatric:     ?   Mood and Affect: Mood normal.  ? ? ?ED Results / Procedures / Treatments   ?Labs ?(all labs ordered are listed, but only abnormal results are displayed) ?Labs Reviewed  ?CBC WITH DIFFERENTIAL/PLATELET - Abnormal; Notable for the following components:  ?    Result Value  ?  RBC 5.34 (*)   ? Hemoglobin 15.3 (*)   ? All other components within normal limits  ?COMPREHENSIVE METABOLIC PANEL - Abnormal; Notable for the following components:  ? Glucose, Bld 102 (*)   ? Total Protein 8.3 (*)   ? All other components within normal limits  ?URINALYSIS, ROUTINE W REFLEX MICROSCOPIC  ?PREGNANCY, URINE  ?SICKLE CELL SCREEN  ? ? ?EKG ?None ? ?Radiology ?No results found. ? ?Procedures ?Procedures  ? ? ?Medications Ordered in ED ?Medications - No data to display ? ?ED Course/ Medical Decision Making/ A&P ?  ?                        ?Medical Decision Making ?Amount and/or Complexity of Data Reviewed ?Labs: ordered. ? ? ?Vital signs are stable, lung sounds are clear, patient is well-appearing.  She has minimal symptoms and no active leg cramping at this time.  We will plan for outpatient sickle cell testing and outpatient follow-up.  Patient at this time appears safe and stable for discharge and close outpatient follow up. Discharge plan and strict return to ED precautions discussed, patient verbalizes understanding and agreement. ? ? ? ? ? ? ? ?Final Clinical Impression(s) / ED Diagnoses ?Final diagnoses:  ?General medical exam  ? ? ?Rx / DC Orders ?ED Discharge Orders   ? ? None  ? ?  ? ? ?  ?Rozelle Logan, DO ?04/27/21 1825 ? ?

## 2021-04-27 NOTE — ED Triage Notes (Signed)
Leg pain. She was seen by her MD yesterday for same. Her mother told her when she was a baby she has sickle cell but she has never been treated for sickle cell. She is not sure she has sickle cell. Body aches and fatigue. She had a negative Flu and Covid.  ?

## 2021-04-27 NOTE — Discharge Instructions (Signed)
You have been seen and discharged from the emergency department.  Additional testing for sickle cell and hemoglobin as has been sent off.  This will need to be followed up as an outpatient.  Establish care and follow-up with your primary provider for further evaluation and further care.  ?

## 2021-04-27 NOTE — ED Notes (Signed)
Lab aware of sickle cell screen. Received lavender top ?

## 2021-04-28 LAB — SICKLE CELL SCREEN: Sickle Cell Screen: POSITIVE — AB

## 2021-08-04 ENCOUNTER — Encounter (HOSPITAL_BASED_OUTPATIENT_CLINIC_OR_DEPARTMENT_OTHER): Payer: Self-pay

## 2021-08-04 ENCOUNTER — Emergency Department (HOSPITAL_BASED_OUTPATIENT_CLINIC_OR_DEPARTMENT_OTHER)
Admission: EM | Admit: 2021-08-04 | Discharge: 2021-08-04 | Disposition: A | Discharge status: Home / Self Care | Payer: BC Managed Care – PPO | Attending: Emergency Medicine | Admitting: Emergency Medicine

## 2021-08-04 DIAGNOSIS — Z91040 Latex allergy status: Secondary | ICD-10-CM | POA: Insufficient documentation

## 2021-08-04 DIAGNOSIS — R21 Rash and other nonspecific skin eruption: Secondary | ICD-10-CM | POA: Insufficient documentation

## 2021-08-04 LAB — PREGNANCY, URINE: Preg Test, Ur: NEGATIVE

## 2021-08-04 MED ORDER — HYDROCORTISONE 1 % EX CREA: TOPICAL_CREAM | CUTANEOUS | 0 refills | Status: DC

## 2021-08-04 MED ORDER — PREDNISONE 20 MG PO TABS: 40.0000 mg | ORAL_TABLET | Freq: Every day | ORAL | 0 refills | Status: AC

## 2021-08-04 NOTE — ED Notes (Signed)
Cruise was to [place], [place] and [place].

## 2021-08-04 NOTE — ED Triage Notes (Signed)
States came back from a cruise on Sunday, states started having rash about a week ago. States rash itches and burns. Nausea yesterday

## 2021-08-04 NOTE — Discharge Instructions (Signed)
Please take the steroid prednisone as prescribed for the next 5 days.  We will also give you a cream to apply as well.  Take famotidine or Pepcid once daily for the next 5 days along with Benadryl as needed for itching.  If you see no improvement in the next 24 to 40 hours return to the ER follow-up with your primary care doctor.

## 2021-08-04 NOTE — ED Provider Notes (Signed)
[facility] EMERGENCY DEPARTMENT Provider Note   CSN: [id number] Arrival date & time: 08/04/21  6269     History  Chief Complaint  Patient presents with   Rash    Jamie Hansen is a 26 y.o. female.  HPI 26 year old female with no pertinent past medical history presents to the emergency department today for evaluation of rash.  Patient noticed a rash about a week ago.  Patient ports that she returned from a cruise from [place].  Patient reports the rash is pruritic in nature and does burn.  Patient states that is located to her arms and legs.  Denies difficulties breathing or swallowing.  Denies any new soaps, lotions, detergents or medications.  Denies any oral mucosal lesions.  No fevers or chills.  She has tried no medications for symptoms prior to arrival.    Home Medications Prior to Admission medications   Medication Sig Start Date End Date Taking? Authorizing Provider  hydrocortisone cream 1 % Apply to affected area 2 times daily 08/04/21  Yes Sissy Goetzke, Lynann Beaver, PA-C  predniSONE (DELTASONE) 20 MG tablet Take 2 tablets (40 mg total) by mouth daily for 5 days. 08/04/21 08/09/21 Yes Welborn Keena, Lynann Beaver, PA-C  amoxicillin-clavulanate (AUGMENTIN) 875-125 MG tablet Take 1 tablet by mouth 2 (two) times daily. One po bid x 7 days 08/27/18   Loren Racer, MD  cephALEXin (KEFLEX) 500 MG capsule Take 1 capsule (500 mg total) by mouth 4 (four) times daily. 05/26/17   Gwyneth Sprout, MD  guaiFENesin-codeine (ROBITUSSIN AC) 100-10 MG/5ML syrup Take 5 mLs by mouth 3 (three) times daily as needed for cough. 03/10/18   Renne Crigler, PA-C  ibuprofen (ADVIL,MOTRIN) 600 MG tablet Take 1 tablet (600 mg total) by mouth every 6 (six) hours. 02/25/17   Aviva Signs, CNM  ondansetron (ZOFRAN-ODT) 4 MG disintegrating tablet Take 1 tablet (4 mg total) by mouth every 8 (eight) hours as needed for nausea or vomiting. 08/28/20   Benjiman Core, MD  valACYclovir (VALTREX)  1000 MG tablet Take 1 tablet (1,000 mg total) by mouth 3 (three) times daily. 06/04/17   Jacalyn Lefevre, MD      Allergies    Latex    Review of Systems   Review of Systems Please refer to the HPI Physical Exam Updated Vital Signs BP 137/87 (BP Location: Left Arm)   Pulse 96   Temp 98 F (36.7 C) (Oral)   Resp 18   Ht 4\' 11"  (1.499 m)   Wt 61.2 kg   LMP 08/02/2021 (Exact Date)   SpO2 99%   BMI 27.27 kg/m  Physical Exam Vitals and nursing note reviewed.  Constitutional:      General: She is not in acute distress.    Appearance: She is well-developed. She is not ill-appearing or toxic-appearing.  HENT:     Head: Normocephalic and atraumatic.  Eyes:     General: No scleral icterus.       Right eye: No discharge.        Left eye: No discharge.     Conjunctiva/sclera: Conjunctivae normal.  Pulmonary:     Effort: No respiratory distress.  Musculoskeletal:        General: Normal range of motion.     Cervical back: Normal range of motion.  Skin:    General: Skin is warm and dry.     Capillary Refill: Capillary refill takes less than 2 seconds.     Coloration: Skin is not pale.  Findings: Rash present.     Comments: Patient with patches of vesicular lesions with erythema to the right upper arm, left upper arm, bilateral thighs.  There is no evidence of cellulitis, abscess.  Pruritic in nature with excoriations noted.  There is no oral mucosal lesions.  No lesions on the palms or soles.  Neurological:     Mental Status: She is alert.  Psychiatric:        Behavior: Behavior normal.        Thought Content: Thought content normal.        Judgment: Judgment normal.     ED Results / Procedures / Treatments   Labs (all labs ordered are listed, but only abnormal results are displayed) Labs Reviewed  PREGNANCY, URINE    EKG None  Radiology No results found.  Procedures Procedures    Medications Ordered in ED Medications - No data to display  ED Course/  Medical Decision Making/ A&P                           Medical Decision Making 26 year old presents to the ER for evaluation of rash.  Differential diagnosis includes contact dermatitis, viral exanthem, shingles, allergic reaction.  Presentation not consistent with Stevens-Johnson syndrome, hand-foot-and-mouth at this time.  Patient's vital signs reassuring.  No evidence of anaphylaxis.  Will treat with topical steroid cream along with 5 days of prednisone burst.  Encouraged use of Pepcid and Benadryl at home.  If no improvement patient was encouraged to return to the ER.  I have considered admission but do not feel this is indicated at this time.  Encouraged PCP follow-up and return precautions with patient.  Problems Addressed: Rash: self-limited or minor problem  Risk Prescription drug management.           Final Clinical Impression(s) / ED Diagnoses Final diagnoses:  Rash    Rx / DC Orders ED Discharge Orders          Ordered    predniSONE (DELTASONE) 20 MG tablet  Daily        08/04/21 0943    hydrocortisone cream 1 %        08/04/21 0943              Rise Mu, PA-C 08/04/21 1011    Alvira Monday, MD 08/04/21 1802

## 2021-12-31 ENCOUNTER — Emergency Department (HOSPITAL_BASED_OUTPATIENT_CLINIC_OR_DEPARTMENT_OTHER): Payer: BC Managed Care – PPO

## 2021-12-31 ENCOUNTER — Encounter (HOSPITAL_BASED_OUTPATIENT_CLINIC_OR_DEPARTMENT_OTHER): Payer: Self-pay | Admitting: Emergency Medicine

## 2021-12-31 ENCOUNTER — Inpatient Hospital Stay (HOSPITAL_BASED_OUTPATIENT_CLINIC_OR_DEPARTMENT_OTHER)
Admission: EM | Admit: 2021-12-31 | Discharge: 2022-01-05 | DRG: 417 | Disposition: A | Discharge status: Home / Self Care | Payer: BC Managed Care – PPO | Attending: Family Medicine | Admitting: Family Medicine

## 2021-12-31 ENCOUNTER — Encounter (HOSPITAL_COMMUNITY): Payer: Self-pay

## 2021-12-31 ENCOUNTER — Other Ambulatory Visit: Payer: Self-pay

## 2021-12-31 DIAGNOSIS — K9189 Other postprocedural complications and disorders of digestive system: Secondary | ICD-10-CM | POA: Insufficient documentation

## 2021-12-31 DIAGNOSIS — K8042 Calculus of bile duct with acute cholecystitis without obstruction: Secondary | ICD-10-CM | POA: Diagnosis not present

## 2021-12-31 DIAGNOSIS — Z87891 Personal history of nicotine dependence: Secondary | ICD-10-CM

## 2021-12-31 DIAGNOSIS — K819 Cholecystitis, unspecified: Principal | ICD-10-CM

## 2021-12-31 DIAGNOSIS — F319 Bipolar disorder, unspecified: Secondary | ICD-10-CM | POA: Diagnosis present

## 2021-12-31 DIAGNOSIS — Z9049 Acquired absence of other specified parts of digestive tract: Secondary | ICD-10-CM

## 2021-12-31 DIAGNOSIS — Z79899 Other long term (current) drug therapy: Secondary | ICD-10-CM

## 2021-12-31 DIAGNOSIS — K810 Acute cholecystitis: Secondary | ICD-10-CM | POA: Diagnosis present

## 2021-12-31 DIAGNOSIS — K859 Acute pancreatitis without necrosis or infection, unspecified: Secondary | ICD-10-CM | POA: Diagnosis not present

## 2021-12-31 DIAGNOSIS — Y848 Other medical procedures as the cause of abnormal reaction of the patient, or of later complication, without mention of misadventure at the time of the procedure: Secondary | ICD-10-CM | POA: Diagnosis not present

## 2021-12-31 DIAGNOSIS — Z91040 Latex allergy status: Secondary | ICD-10-CM

## 2021-12-31 LAB — CBC
HCT: 40.7 % (ref 36.0–46.0)
Hemoglobin: 13.9 g/dL (ref 12.0–15.0)
MCH: 28.1 pg (ref 26.0–34.0)
MCHC: 34.2 g/dL (ref 30.0–36.0)
MCV: 82.4 fL (ref 80.0–100.0)
Platelets: 275 10*3/uL (ref 150–400)
RBC: 4.94 MIL/uL (ref 3.87–5.11)
RDW: 13.5 % (ref 11.5–15.5)
WBC: 10.3 10*3/uL (ref 4.0–10.5)
nRBC: 0 % (ref 0.0–0.2)

## 2021-12-31 LAB — URINALYSIS, MICROSCOPIC (REFLEX)

## 2021-12-31 LAB — COMPREHENSIVE METABOLIC PANEL
ALT: 262 U/L — ABNORMAL HIGH (ref 0–44)
AST: 102 U/L — ABNORMAL HIGH (ref 15–41)
Albumin: 4.3 g/dL (ref 3.5–5.0)
Alkaline Phosphatase: 99 U/L (ref 38–126)
Anion gap: 11 (ref 5–15)
BUN: 13 mg/dL (ref 6–20)
CO2: 25 mmol/L (ref 22–32)
Calcium: 9 mg/dL (ref 8.9–10.3)
Chloride: 100 mmol/L (ref 98–111)
Creatinine, Ser: 0.75 mg/dL (ref 0.44–1.00)
GFR, Estimated: >60 mL/min (ref >60)
Glucose, Bld: 104 mg/dL — ABNORMAL HIGH (ref 70–99)
Potassium: 3.9 mmol/L (ref 3.5–5.1)
Sodium: 136 mmol/L (ref 135–145)
Total Bilirubin: 1 mg/dL (ref 0.3–1.2)
Total Protein: 7.8 g/dL (ref 6.5–8.1)

## 2021-12-31 LAB — URINALYSIS, ROUTINE W REFLEX MICROSCOPIC
Bilirubin Urine: NEGATIVE
Glucose, UA: NEGATIVE mg/dL
Ketones, ur: NEGATIVE mg/dL
Leukocytes,Ua: NEGATIVE
Nitrite: NEGATIVE
Protein, ur: NEGATIVE mg/dL
Specific Gravity, Urine: 1.02 (ref 1.005–1.030)
pH: 5.5 (ref 5.0–8.0)

## 2021-12-31 LAB — LIPASE, BLOOD: Lipase: 36 U/L (ref 11–51)

## 2021-12-31 LAB — PREGNANCY, URINE: Preg Test, Ur: NEGATIVE

## 2021-12-31 MED ORDER — MORPHINE SULFATE (PF) 4 MG/ML IV SOLN
6.0000 mg | Freq: Once | INTRAVENOUS | Status: AC
  Administered 2021-12-31: 6 mg via INTRAVENOUS
  Filled 2021-12-31: qty 2

## 2021-12-31 MED ORDER — IOHEXOL 300 MG/ML  SOLN
100.0000 mL | Freq: Once | INTRAMUSCULAR | Status: AC | PRN
  Administered 2021-12-31: 100 mL via INTRAVENOUS

## 2021-12-31 MED ORDER — PROCHLORPERAZINE EDISYLATE 10 MG/2ML IJ SOLN
10.0000 mg | Freq: Once | INTRAMUSCULAR | Status: AC
  Administered 2021-12-31: 10 mg via INTRAVENOUS
  Filled 2021-12-31: qty 2

## 2021-12-31 MED ORDER — DIPHENHYDRAMINE HCL 50 MG/ML IJ SOLN
25.0000 mg | Freq: Once | INTRAMUSCULAR | Status: AC
  Administered 2021-12-31: 25 mg via INTRAVENOUS
  Filled 2021-12-31: qty 1

## 2021-12-31 MED ORDER — ONDANSETRON HCL 4 MG/2ML IJ SOLN
4.0000 mg | Freq: Once | INTRAMUSCULAR | Status: AC
  Administered 2021-12-31: 4 mg via INTRAVENOUS
  Filled 2021-12-31: qty 2

## 2021-12-31 MED ORDER — PIPERACILLIN-TAZOBACTAM 3.375 G IVPB 30 MIN
3.3750 g | Freq: Once | INTRAVENOUS | Status: AC
  Administered 2021-12-31: 3.375 g via INTRAVENOUS
  Filled 2021-12-31: qty 50

## 2021-12-31 MED ORDER — FENTANYL CITRATE PF 50 MCG/ML IJ SOSY
50.0000 ug | PREFILLED_SYRINGE | INTRAMUSCULAR | Status: DC | PRN
  Administered 2021-12-31: 50 ug via INTRAVENOUS
  Filled 2021-12-31: qty 1

## 2021-12-31 MED ORDER — HYDROMORPHONE HCL 1 MG/ML IJ SOLN
1.0000 mg | Freq: Once | INTRAMUSCULAR | Status: AC
  Administered 2021-12-31: 1 mg via INTRAVENOUS
  Filled 2021-12-31: qty 1

## 2021-12-31 MED ORDER — SODIUM CHLORIDE 0.9 % IV BOLUS
1000.0000 mL | Freq: Once | INTRAVENOUS | Status: AC
  Administered 2021-12-31: 1000 mL via INTRAVENOUS

## 2021-12-31 NOTE — ED Provider Notes (Signed)
[facility] EMERGENCY DEPARTMENT Provider Note   CSN: [id number] Arrival date & time: 12/31/21  2006     History  Chief Complaint  Patient presents with   Abdominal Pain    Jamie Hansen is a 26 y.o. female.  26 yo F with a chief complaints of right upper quadrant abdominal discomfort.  This been going on for about 48 hours.  The patient was seen at an outside hospital and had a right upper quadrant ultrasound performed and was discharged home with a diagnosis of gastritis.  She has been trying to take her medicines at home and was able to eat 2 small meals and then developed worsening discomfort.  She has been writhing at home.  No obvious fevers.  Has had some nausea and vomiting.  No diarrhea.   Abdominal Pain      Home Medications Prior to Admission medications   Medication Sig Start Date End Date Taking? Authorizing Provider  amoxicillin-clavulanate (AUGMENTIN) 875-125 MG tablet Take 1 tablet by mouth 2 (two) times daily. One po bid x 7 days 08/27/18   Loren Racer, MD  cephALEXin (KEFLEX) 500 MG capsule Take 1 capsule (500 mg total) by mouth 4 (four) times daily. 05/26/17   Gwyneth Sprout, MD  guaiFENesin-codeine (ROBITUSSIN AC) 100-10 MG/5ML syrup Take 5 mLs by mouth 3 (three) times daily as needed for cough. 03/10/18   Renne Crigler, PA-C  hydrocortisone cream 1 % Apply to affected area 2 times daily 08/04/21   Leaphart, Lynann Beaver, PA-C  ibuprofen (ADVIL,MOTRIN) 600 MG tablet Take 1 tablet (600 mg total) by mouth every 6 (six) hours. 02/25/17   Aviva Signs, CNM  ondansetron (ZOFRAN-ODT) 4 MG disintegrating tablet Take 1 tablet (4 mg total) by mouth every 8 (eight) hours as needed for nausea or vomiting. 08/28/20   Benjiman Core, MD  valACYclovir (VALTREX) 1000 MG tablet Take 1 tablet (1,000 mg total) by mouth 3 (three) times daily. 06/04/17   Jacalyn Lefevre, MD      Allergies    Latex    Review of Systems   Review of Systems   Gastrointestinal:  Positive for abdominal pain.    Physical Exam Updated Vital Signs BP 122/78 (BP Location: Left Arm)   Pulse 80   Temp 98.6 F (37 C) (Oral)   Resp 16   Ht 4\' 11"  (1.499 m)   Wt 58.1 kg   LMP 12/11/2021 (Approximate)   SpO2 95%   BMI 25.85 kg/m  Physical Exam Vitals and nursing note reviewed.  Constitutional:      General: She is not in acute distress.    Appearance: She is well-developed. She is not diaphoretic.  HENT:     Head: Normocephalic and atraumatic.  Eyes:     Pupils: Pupils are equal, round, and reactive to light.  Cardiovascular:     Rate and Rhythm: Normal rate and regular rhythm.     Heart sounds: No murmur heard.    No friction rub. No gallop.  Pulmonary:     Effort: Pulmonary effort is normal.     Breath sounds: No wheezing or rales.  Abdominal:     General: There is no distension.     Palpations: Abdomen is soft.     Tenderness: There is abdominal tenderness.     Comments: Pain about the epigastrium but worse in the right upper quadrant.  Musculoskeletal:        General: No tenderness.     Cervical back: Normal range of  motion and neck supple.  Skin:    General: Skin is warm and dry.  Neurological:     Mental Status: She is alert and oriented to person, place, and time.  Psychiatric:        Behavior: Behavior normal.     ED Results / Procedures / Treatments   Labs (all labs ordered are listed, but only abnormal results are displayed) Labs Reviewed  COMPREHENSIVE METABOLIC PANEL - Abnormal; Notable for the following components:      Result Value   Glucose, Bld 104 (*)    AST 102 (*)    ALT 262 (*)    All other components within normal limits  URINALYSIS, ROUTINE W REFLEX MICROSCOPIC - Abnormal; Notable for the following components:   Hgb urine dipstick TRACE (*)    All other components within normal limits  URINALYSIS, MICROSCOPIC (REFLEX) - Abnormal; Notable for the following components:   Bacteria, UA FEW (*)    All  other components within normal limits  LIPASE, BLOOD  CBC  PREGNANCY, URINE    EKG None  Radiology CT ABDOMEN PELVIS W CONTRAST  Result Date: 12/31/2021 CLINICAL DATA:  Right upper quadrant abdominal pain EXAM: CT ABDOMEN AND PELVIS WITH CONTRAST TECHNIQUE: Multidetector CT imaging of the abdomen and pelvis was performed using the standard protocol following bolus administration of intravenous contrast. RADIATION DOSE REDUCTION: This exam was performed according to the departmental dose-optimization program which includes automated exposure control, adjustment of the mA and/or kV according to patient size and/or use of iterative reconstruction technique. CONTRAST:  128mL OMNIPAQUE IOHEXOL 300 MG/ML  SOLN COMPARISON:  None Available. FINDINGS: Lower chest: No acute abnormality. Hepatobiliary: No suspicious focal liver abnormality is seen. Gallbladder wall thickening and intrahepatic biliary ductal dilation. Mild prominence of the common bile duct measuring 6 mm in diameter. No definite obstructing stone. Pancreas: Unremarkable. No pancreatic ductal dilatation or surrounding inflammatory changes. Spleen: Normal in size without focal abnormality. Adrenals/Urinary Tract: Adrenal glands are unremarkable. Kidneys are normal, without renal calculi, suspicious focal lesion, or hydronephrosis. Bladder is unremarkable. Stomach/Bowel: Stomach is within normal limits. No evidence of bowel wall thickening, distention, or inflammatory changes. The appendix is normal. Moderate colonic stool load. Fecalization of the terminal ileum. Vascular/Lymphatic: No significant vascular findings are present. No enlarged abdominal or pelvic lymph nodes. Reproductive: Tubal ligation. Unremarkable appearance of the right ovary and uterus. Mass within the left ovary with fat density compatible with dermoid measuring 3.0 cm. This has increased in size since ultrasound 07/11/2016. Other: Small amount of free fluid in the pelvis, likely  physiologic. No free intraperitoneal air. Musculoskeletal: No acute or significant osseous findings. IMPRESSION: Findings suggestive of acute cholecystitis. No definite cholelithiasis. Fat density mass in the right ovary consistent with dermoid cyst, increased since 07/11/2016. Moderate fecal burden with fecalization of the terminal ileum. Recommend correlation for constipation. Electronically Signed   By: Placido Sou M.D.   On: 12/31/2021 22:19    Procedures Procedures    Medications Ordered in ED Medications  fentaNYL (SUBLIMAZE) injection 50 mcg (50 mcg Intravenous Given 12/31/21 2025)  piperacillin-tazobactam (ZOSYN) IVPB 3.375 g (3.375 g Intravenous New Bag/Given 12/31/21 2259)  ondansetron (ZOFRAN) injection 4 mg (4 mg Intravenous Given 12/31/21 2025)  morphine (PF) 4 MG/ML injection 6 mg (6 mg Intravenous Given 12/31/21 2100)  ondansetron (ZOFRAN) injection 4 mg (4 mg Intravenous Given 12/31/21 2100)  sodium chloride 0.9 % bolus 1,000 mL (0 mLs Intravenous Stopped 12/31/21 2248)  iohexol (OMNIPAQUE) 300 MG/ML solution 100  mL (100 mLs Intravenous Contrast Given 12/31/21 2144)  HYDROmorphone (DILAUDID) injection 1 mg (1 mg Intravenous Given 12/31/21 2200)    ED Course/ Medical Decision Making/ A&P                           Medical Decision Making Amount and/or Complexity of Data Reviewed Labs: ordered. Radiology: ordered.  Risk Prescription drug management. Decision regarding hospitalization.   26 yo F with a chief complaints of right upper quadrant abdominal pain.  This been going on for about 48 hours.  Was seen in outside hospital.  I was able to review those records at that time her LFTs were quite elevated actually up to the 300s with an elevation of her total bilirubin as well.  She had a right upper quadrant ultrasound without common bile duct dilatation no signs of acute cholecystitis and was discharged home.  Since then she has had persistent symptoms worsening right upper  quadrant pain.  Was able to try to drink and eat 2 small meals but developed worsening discomfort and persistent nausea.  She is quite uncomfortable on initial exam.  Will obtain lab work and CT.  CT scan with gallbladder wall thickening and pericholecystic fluid.  No obvious gallstones.  The common bile duct was mildly dilated.  I discussed the case with Leighton Ruff, general surgery.  Recommended right upper quadrant ultrasound admission to medicine and they will evaluate her in the morning.  The patients results and plan were reviewed and discussed.   Any x-rays performed were independently reviewed by myself.   Differential diagnosis were considered with the presenting HPI.  Medications  fentaNYL (SUBLIMAZE) injection 50 mcg (50 mcg Intravenous Given 12/31/21 2025)  piperacillin-tazobactam (ZOSYN) IVPB 3.375 g (3.375 g Intravenous New Bag/Given 12/31/21 2259)  ondansetron (ZOFRAN) injection 4 mg (4 mg Intravenous Given 12/31/21 2025)  morphine (PF) 4 MG/ML injection 6 mg (6 mg Intravenous Given 12/31/21 2100)  ondansetron (ZOFRAN) injection 4 mg (4 mg Intravenous Given 12/31/21 2100)  sodium chloride 0.9 % bolus 1,000 mL (0 mLs Intravenous Stopped 12/31/21 2248)  iohexol (OMNIPAQUE) 300 MG/ML solution 100 mL (100 mLs Intravenous Contrast Given 12/31/21 2144)  HYDROmorphone (DILAUDID) injection 1 mg (1 mg Intravenous Given 12/31/21 2200)    Vitals:   12/31/21 2011 12/31/21 2011 12/31/21 2200 12/31/21 2230  BP:  101/77 (!) 117/99 122/78  Pulse:  90 89 80  Resp:  18 20 16   Temp:  98.6 F (37 C)    TempSrc:  Oral    SpO2:  100% 100% 95%  Weight: 58.1 kg     Height: 4\' 11"  (1.499 m)       Final diagnoses:  Acalculous cholecystitis    Admission/ observation were discussed with the admitting physician, patient and/or family and they are comfortable with the plan.          Final Clinical Impression(s) / ED Diagnoses Final diagnoses:  Acalculous cholecystitis    Rx / DC  Orders ED Discharge Orders     None         Deno Etienne, DO 12/31/21 2315

## 2021-12-31 NOTE — ED Notes (Signed)
Pt screaming and crying in the room. Pt already given pain meds and nausea meds.

## 2021-12-31 NOTE — ED Notes (Signed)
Pt is aware she needs a urine sample and she has a urine cup.

## 2021-12-31 NOTE — ED Triage Notes (Signed)
Per pt family upper right abdominal pain seen at hospital yesterday was told heartburn.

## 2021-12-31 NOTE — ED Notes (Signed)
Patient transported to CT 

## 2022-01-01 ENCOUNTER — Encounter (HOSPITAL_COMMUNITY): Payer: Self-pay | Admitting: Family Medicine

## 2022-01-01 ENCOUNTER — Inpatient Hospital Stay (HOSPITAL_COMMUNITY): Payer: BC Managed Care – PPO | Admitting: Anesthesiology

## 2022-01-01 ENCOUNTER — Other Ambulatory Visit: Payer: Self-pay

## 2022-01-01 ENCOUNTER — Encounter (HOSPITAL_COMMUNITY)
Admission: EM | Disposition: A | Discharge status: Home / Self Care | Payer: Self-pay | Source: Home / Self Care | Attending: Family Medicine

## 2022-01-01 ENCOUNTER — Inpatient Hospital Stay (HOSPITAL_COMMUNITY): Payer: BC Managed Care – PPO

## 2022-01-01 DIAGNOSIS — F319 Bipolar disorder, unspecified: Secondary | ICD-10-CM | POA: Diagnosis not present

## 2022-01-01 DIAGNOSIS — Z9049 Acquired absence of other specified parts of digestive tract: Secondary | ICD-10-CM

## 2022-01-01 DIAGNOSIS — Z87891 Personal history of nicotine dependence: Secondary | ICD-10-CM | POA: Diagnosis not present

## 2022-01-01 DIAGNOSIS — K859 Acute pancreatitis without necrosis or infection, unspecified: Secondary | ICD-10-CM | POA: Diagnosis not present

## 2022-01-01 DIAGNOSIS — Y848 Other medical procedures as the cause of abnormal reaction of the patient, or of later complication, without mention of misadventure at the time of the procedure: Secondary | ICD-10-CM | POA: Diagnosis not present

## 2022-01-01 DIAGNOSIS — Z91040 Latex allergy status: Secondary | ICD-10-CM | POA: Diagnosis not present

## 2022-01-01 DIAGNOSIS — K8042 Calculus of bile duct with acute cholecystitis without obstruction: Secondary | ICD-10-CM | POA: Diagnosis present

## 2022-01-01 DIAGNOSIS — K819 Cholecystitis, unspecified: Secondary | ICD-10-CM

## 2022-01-01 DIAGNOSIS — Z79899 Other long term (current) drug therapy: Secondary | ICD-10-CM | POA: Diagnosis not present

## 2022-01-01 HISTORY — PX: CHOLECYSTECTOMY: SHX55

## 2022-01-01 LAB — SURGICAL PCR SCREEN
MRSA, PCR: NEGATIVE
Staphylococcus aureus: NEGATIVE

## 2022-01-01 LAB — CBC
HCT: 34.5 % — ABNORMAL LOW (ref 36.0–46.0)
Hemoglobin: 12.1 g/dL (ref 12.0–15.0)
MCH: 29.2 pg (ref 26.0–34.0)
MCHC: 35.1 g/dL (ref 30.0–36.0)
MCV: 83.1 fL (ref 80.0–100.0)
Platelets: 213 10*3/uL (ref 150–400)
RBC: 4.15 MIL/uL (ref 3.87–5.11)
RDW: 13.6 % (ref 11.5–15.5)
WBC: 8.4 10*3/uL (ref 4.0–10.5)
nRBC: 0 % (ref 0.0–0.2)

## 2022-01-01 LAB — COMPREHENSIVE METABOLIC PANEL
ALT: 205 U/L — ABNORMAL HIGH (ref 0–44)
AST: 79 U/L — ABNORMAL HIGH (ref 15–41)
Albumin: 3.3 g/dL — ABNORMAL LOW (ref 3.5–5.0)
Alkaline Phosphatase: 84 U/L (ref 38–126)
Anion gap: 4 — ABNORMAL LOW (ref 5–15)
BUN: 10 mg/dL (ref 6–20)
CO2: 25 mmol/L (ref 22–32)
Calcium: 8 mg/dL — ABNORMAL LOW (ref 8.9–10.3)
Chloride: 109 mmol/L (ref 98–111)
Creatinine, Ser: 0.71 mg/dL (ref 0.44–1.00)
GFR, Estimated: >60 mL/min (ref >60)
Glucose, Bld: 99 mg/dL (ref 70–99)
Potassium: 3.5 mmol/L (ref 3.5–5.1)
Sodium: 138 mmol/L (ref 135–145)
Total Bilirubin: 1.1 mg/dL (ref 0.3–1.2)
Total Protein: 6.1 g/dL — ABNORMAL LOW (ref 6.5–8.1)

## 2022-01-01 LAB — HIV ANTIBODY (ROUTINE TESTING W REFLEX): HIV Screen 4th Generation wRfx: NONREACTIVE

## 2022-01-01 SURGERY — LAPAROSCOPIC CHOLECYSTECTOMY
Anesthesia: General

## 2022-01-01 MED ORDER — OXYCODONE HCL 5 MG/5ML PO SOLN: 5.0000 mg | Freq: Once | ORAL | Status: DC | PRN

## 2022-01-01 MED ORDER — ROCURONIUM BROMIDE 10 MG/ML (PF) SYRINGE
PREFILLED_SYRINGE | INTRAVENOUS | Status: AC
  Filled 2022-01-01: qty 10

## 2022-01-01 MED ORDER — ACETAMINOPHEN 325 MG PO TABS
650.0000 mg | ORAL_TABLET | Freq: Four times a day (QID) | ORAL | Status: DC | PRN
  Administered 2022-01-02 – 2022-01-03 (×3): 650 mg via ORAL
  Filled 2022-01-01 (×4): qty 2

## 2022-01-01 MED ORDER — ACETAMINOPHEN 650 MG RE SUPP: 650.0000 mg | Freq: Four times a day (QID) | RECTAL | Status: DC | PRN

## 2022-01-01 MED ORDER — CHLORHEXIDINE GLUCONATE CLOTH 2 % EX PADS
6.0000 | MEDICATED_PAD | Freq: Once | CUTANEOUS | Status: AC
  Administered 2022-01-01: 6 via TOPICAL

## 2022-01-01 MED ORDER — FENTANYL CITRATE (PF) 250 MCG/5ML IJ SOLN
INTRAMUSCULAR | Status: DC | PRN
  Administered 2022-01-01: 50 ug via INTRAVENOUS
  Administered 2022-01-01: 150 ug via INTRAVENOUS

## 2022-01-01 MED ORDER — BUPIVACAINE-EPINEPHRINE (PF) 0.5% -1:200000 IJ SOLN
INTRAMUSCULAR | Status: AC
  Filled 2022-01-01: qty 30

## 2022-01-01 MED ORDER — ONDANSETRON HCL 4 MG PO TABS
4.0000 mg | ORAL_TABLET | Freq: Four times a day (QID) | ORAL | Status: DC | PRN
  Administered 2022-01-03 – 2022-01-04 (×2): 4 mg via ORAL
  Filled 2022-01-01 (×2): qty 1

## 2022-01-01 MED ORDER — SCOPOLAMINE 1 MG/3DAYS TD PT72
1.0000 | MEDICATED_PATCH | Freq: Once | TRANSDERMAL | Status: DC
  Administered 2022-01-01: 1.5 mg via TRANSDERMAL
  Filled 2022-01-01: qty 1

## 2022-01-01 MED ORDER — HYDROMORPHONE HCL 1 MG/ML IJ SOLN
0.2500 mg | INTRAMUSCULAR | Status: DC | PRN
  Administered 2022-01-01: 0.5 mg via INTRAVENOUS

## 2022-01-01 MED ORDER — GABAPENTIN 300 MG PO CAPS
300.0000 mg | ORAL_CAPSULE | Freq: Once | ORAL | Status: AC
  Administered 2022-01-01: 300 mg via ORAL
  Filled 2022-01-01: qty 1

## 2022-01-01 MED ORDER — PROPOFOL 10 MG/ML IV BOLUS
INTRAVENOUS | Status: AC
  Filled 2022-01-01: qty 20

## 2022-01-01 MED ORDER — ROCURONIUM BROMIDE 10 MG/ML (PF) SYRINGE
PREFILLED_SYRINGE | INTRAVENOUS | Status: DC | PRN
  Administered 2022-01-01: 30 mg via INTRAVENOUS

## 2022-01-01 MED ORDER — LACTATED RINGERS IV SOLN: INTRAVENOUS | Status: DC

## 2022-01-01 MED ORDER — ENOXAPARIN SODIUM 40 MG/0.4ML IJ SOSY
40.0000 mg | PREFILLED_SYRINGE | INTRAMUSCULAR | Status: DC
  Administered 2022-01-01 – 2022-01-04 (×4): 40 mg via SUBCUTANEOUS
  Filled 2022-01-01 (×4): qty 0.4

## 2022-01-01 MED ORDER — PROMETHAZINE HCL 25 MG/ML IJ SOLN
INTRAMUSCULAR | Status: AC
  Filled 2022-01-01: qty 1

## 2022-01-01 MED ORDER — ONDANSETRON HCL 4 MG/2ML IJ SOLN
INTRAMUSCULAR | Status: AC
  Filled 2022-01-01: qty 2

## 2022-01-01 MED ORDER — LIDOCAINE HCL (PF) 2 % IJ SOLN
INTRAMUSCULAR | Status: AC
  Filled 2022-01-01: qty 5

## 2022-01-01 MED ORDER — CEFAZOLIN SODIUM-DEXTROSE 2-4 GM/100ML-% IV SOLN
INTRAVENOUS | Status: AC
  Filled 2022-01-01: qty 100

## 2022-01-01 MED ORDER — PROMETHAZINE HCL 25 MG/ML IJ SOLN
6.2500 mg | INTRAMUSCULAR | Status: DC | PRN
  Administered 2022-01-01: 6.25 mg via INTRAVENOUS

## 2022-01-01 MED ORDER — SUCCINYLCHOLINE CHLORIDE 200 MG/10ML IV SOSY
PREFILLED_SYRINGE | INTRAVENOUS | Status: DC | PRN
  Administered 2022-01-01: 120 mg via INTRAVENOUS

## 2022-01-01 MED ORDER — DEXAMETHASONE SODIUM PHOSPHATE 10 MG/ML IJ SOLN
INTRAMUSCULAR | Status: DC | PRN
  Administered 2022-01-01: 10 mg via INTRAVENOUS

## 2022-01-01 MED ORDER — ACETAMINOPHEN 500 MG PO TABS
1000.0000 mg | ORAL_TABLET | Freq: Once | ORAL | Status: AC
  Administered 2022-01-01: 1000 mg via ORAL
  Filled 2022-01-01: qty 2

## 2022-01-01 MED ORDER — OXYCODONE HCL 5 MG PO TABS: 5.0000 mg | ORAL_TABLET | Freq: Once | ORAL | Status: DC | PRN

## 2022-01-01 MED ORDER — MIDAZOLAM HCL 2 MG/2ML IJ SOLN
INTRAMUSCULAR | Status: AC
  Filled 2022-01-01: qty 2

## 2022-01-01 MED ORDER — ONDANSETRON HCL 4 MG/2ML IJ SOLN
4.0000 mg | Freq: Four times a day (QID) | INTRAMUSCULAR | Status: DC | PRN
  Administered 2022-01-01 – 2022-01-03 (×5): 4 mg via INTRAVENOUS
  Filled 2022-01-01 (×6): qty 2

## 2022-01-01 MED ORDER — BUPIVACAINE-EPINEPHRINE (PF) 0.25% -1:200000 IJ SOLN
INTRAMUSCULAR | Status: AC
  Filled 2022-01-01: qty 30

## 2022-01-01 MED ORDER — PIPERACILLIN-TAZOBACTAM 3.375 G IVPB
3.3750 g | Freq: Three times a day (TID) | INTRAVENOUS | Status: DC
  Administered 2022-01-01: 3.375 g via INTRAVENOUS
  Filled 2022-01-01: qty 50

## 2022-01-01 MED ORDER — DEXAMETHASONE SODIUM PHOSPHATE 10 MG/ML IJ SOLN
INTRAMUSCULAR | Status: AC
  Filled 2022-01-01: qty 1

## 2022-01-01 MED ORDER — SODIUM CHLORIDE (PF) 0.9 % IJ SOLN
INTRAMUSCULAR | Status: DC | PRN
  Administered 2022-01-01: 5 mL

## 2022-01-01 MED ORDER — AMISULPRIDE (ANTIEMETIC) 5 MG/2ML IV SOLN: 10.0000 mg | Freq: Once | INTRAVENOUS | Status: DC | PRN

## 2022-01-01 MED ORDER — MEPERIDINE HCL 50 MG/ML IJ SOLN: 6.2500 mg | INTRAMUSCULAR | Status: DC | PRN

## 2022-01-01 MED ORDER — 0.9 % SODIUM CHLORIDE (POUR BTL) OPTIME
TOPICAL | Status: DC | PRN
  Administered 2022-01-01: 1000 mL

## 2022-01-01 MED ORDER — LACTATED RINGERS IR SOLN
Status: DC | PRN
  Administered 2022-01-01: 1000 mL

## 2022-01-01 MED ORDER — SUGAMMADEX SODIUM 200 MG/2ML IV SOLN
INTRAVENOUS | Status: DC | PRN
  Administered 2022-01-01: 130 mg via INTRAVENOUS

## 2022-01-01 MED ORDER — BUPIVACAINE-EPINEPHRINE 0.5% -1:200000 IJ SOLN
INTRAMUSCULAR | Status: DC | PRN
  Administered 2022-01-01: 18 mL

## 2022-01-01 MED ORDER — MORPHINE SULFATE (PF) 2 MG/ML IV SOLN
2.0000 mg | INTRAVENOUS | Status: DC | PRN
  Administered 2022-01-01 – 2022-01-04 (×8): 2 mg via INTRAVENOUS
  Filled 2022-01-01 (×8): qty 1

## 2022-01-01 MED ORDER — MIDAZOLAM HCL 2 MG/2ML IJ SOLN
INTRAMUSCULAR | Status: DC | PRN
  Administered 2022-01-01 (×2): 1 mg via INTRAVENOUS

## 2022-01-01 MED ORDER — CHLORHEXIDINE GLUCONATE CLOTH 2 % EX PADS: 6.0000 | MEDICATED_PAD | Freq: Once | CUTANEOUS | Status: DC

## 2022-01-01 MED ORDER — SUCCINYLCHOLINE CHLORIDE 200 MG/10ML IV SOSY
PREFILLED_SYRINGE | INTRAVENOUS | Status: AC
  Filled 2022-01-01: qty 10

## 2022-01-01 MED ORDER — PROPOFOL 10 MG/ML IV BOLUS
INTRAVENOUS | Status: DC | PRN
  Administered 2022-01-01: 180 mg via INTRAVENOUS

## 2022-01-01 MED ORDER — ONDANSETRON HCL 4 MG/2ML IJ SOLN
INTRAMUSCULAR | Status: DC | PRN
  Administered 2022-01-01 (×2): 4 mg via INTRAVENOUS

## 2022-01-01 MED ORDER — OXYCODONE HCL 5 MG PO TABS
5.0000 mg | ORAL_TABLET | ORAL | Status: DC | PRN
  Administered 2022-01-01: 10 mg via ORAL
  Administered 2022-01-01 – 2022-01-02 (×2): 5 mg via ORAL
  Administered 2022-01-02 – 2022-01-03 (×2): 10 mg via ORAL
  Administered 2022-01-03: 5 mg via ORAL
  Administered 2022-01-03 – 2022-01-04 (×4): 10 mg via ORAL
  Administered 2022-01-04 – 2022-01-05 (×5): 5 mg via ORAL
  Filled 2022-01-01 (×3): qty 2
  Filled 2022-01-01: qty 1
  Filled 2022-01-01 (×2): qty 2
  Filled 2022-01-01: qty 1
  Filled 2022-01-01 (×3): qty 2
  Filled 2022-01-01: qty 1
  Filled 2022-01-01: qty 2
  Filled 2022-01-01: qty 1
  Filled 2022-01-01 (×3): qty 2
  Filled 2022-01-01: qty 1

## 2022-01-01 MED ORDER — HYDROMORPHONE HCL 1 MG/ML IJ SOLN
INTRAMUSCULAR | Status: AC
  Filled 2022-01-01: qty 1

## 2022-01-01 MED ORDER — STERILE WATER FOR INJECTION IJ SOLN
INTRAMUSCULAR | Status: DC | PRN
  Administered 2022-01-01: 3 mL via INTRAVENOUS

## 2022-01-01 MED ORDER — CEFAZOLIN SODIUM-DEXTROSE 2-3 GM-%(50ML) IV SOLR
INTRAVENOUS | Status: DC | PRN
  Administered 2022-01-01: 2 g via INTRAVENOUS

## 2022-01-01 MED ORDER — IBUPROFEN 200 MG PO TABS
400.0000 mg | ORAL_TABLET | Freq: Three times a day (TID) | ORAL | Status: DC | PRN
  Administered 2022-01-01 – 2022-01-04 (×4): 400 mg via ORAL
  Filled 2022-01-01 (×4): qty 1

## 2022-01-01 MED ORDER — FENTANYL CITRATE (PF) 250 MCG/5ML IJ SOLN
INTRAMUSCULAR | Status: AC
  Filled 2022-01-01: qty 5

## 2022-01-01 SURGICAL SUPPLY — 40 items
APPLIER CLIP ROT 10 11.4 M/L (STAPLE) ×1
BAG COUNTER SPONGE SURGICOUNT (BAG) IMPLANT
CABLE HIGH FREQUENCY MONO STRZ (ELECTRODE) ×1 IMPLANT
CHLORAPREP W/TINT 26 (MISCELLANEOUS) ×1 IMPLANT
CLIP APPLIE ROT 10 11.4 M/L (STAPLE) ×1 IMPLANT
COVER MAYO STAND STRL (DRAPES) IMPLANT
COVER SURGICAL LIGHT HANDLE (MISCELLANEOUS) ×1 IMPLANT
DERMABOND ADVANCED .7 DNX12 (GAUZE/BANDAGES/DRESSINGS) ×1 IMPLANT
DRAPE C-ARM 42X120 X-RAY (DRAPES) IMPLANT
ELECT REM PT RETURN 15FT ADLT (MISCELLANEOUS) ×1 IMPLANT
GLOVE BIO SURGEON STRL SZ 6 (GLOVE) ×1 IMPLANT
GLOVE INDICATOR 6.5 STRL GRN (GLOVE) ×1 IMPLANT
GLOVE SS BIOGEL STRL SZ 6 (GLOVE) ×1 IMPLANT
GOWN STRL REUS W/ TWL LRG LVL3 (GOWN DISPOSABLE) ×1 IMPLANT
GOWN STRL REUS W/ TWL XL LVL3 (GOWN DISPOSABLE) IMPLANT
GOWN STRL REUS W/TWL LRG LVL3 (GOWN DISPOSABLE) ×1
GOWN STRL REUS W/TWL XL LVL3 (GOWN DISPOSABLE)
GRASPER SUT TROCAR 14GX15 (MISCELLANEOUS) ×1 IMPLANT
HEMOSTAT SNOW SURGICEL 2X4 (HEMOSTASIS) IMPLANT
IRRIG SUCT STRYKERFLOW 2 WTIP (MISCELLANEOUS) ×1
IRRIGATION SUCT STRKRFLW 2 WTP (MISCELLANEOUS) ×1 IMPLANT
KIT BASIN OR (CUSTOM PROCEDURE TRAY) ×1 IMPLANT
KIT TURNOVER KIT A (KITS) IMPLANT
NDL INSUFFLATION 14GA 120MM (NEEDLE) ×1 IMPLANT
NEEDLE INSUFFLATION 14GA 120MM (NEEDLE) ×1 IMPLANT
PENCIL SMOKE EVACUATOR (MISCELLANEOUS) IMPLANT
SCISSORS LAP 5X35 DISP (ENDOMECHANICALS) ×1 IMPLANT
SET CHOLANGIOGRAPH MIX (MISCELLANEOUS) IMPLANT
SET TUBE SMOKE EVAC HIGH FLOW (TUBING) ×1 IMPLANT
SLEEVE Z-THREAD 5X100MM (TROCAR) ×1 IMPLANT
SPIKE FLUID TRANSFER (MISCELLANEOUS) ×1 IMPLANT
SUT MNCRL AB 4-0 PS2 18 (SUTURE) ×1 IMPLANT
SYS BAG RETRIEVAL 10MM (BASKET) ×1
SYSTEM BAG RETRIEVAL 10MM (BASKET) ×1 IMPLANT
TOWEL OR 17X26 10 PK STRL BLUE (TOWEL DISPOSABLE) ×1 IMPLANT
TOWEL OR NON WOVEN STRL DISP B (DISPOSABLE) IMPLANT
TRAY LAPAROSCOPIC (CUSTOM PROCEDURE TRAY) ×1 IMPLANT
TROCAR ADV FIXATION 12X100MM (TROCAR) ×1 IMPLANT
TROCAR XCEL NON-BLD 5MMX100MML (ENDOMECHANICALS) IMPLANT
TROCAR Z-THREAD OPTICAL 5X100M (TROCAR) ×1 IMPLANT

## 2022-01-01 NOTE — Anesthesia Postprocedure Evaluation (Signed)
Anesthesia Post Note  Patient: [name]  Procedure(s) Performed: LAPAROSCOPIC CHOLECYSTECTOMY WITH ICG DYE AND INTRAOPERATIVE CHOLANGIOGRAM     Patient location during evaluation: PACU Anesthesia Type: General Level of consciousness: awake and alert and oriented Pain management: pain level controlled Vital Signs Assessment: post-procedure vital signs reviewed and stable Respiratory status: spontaneous breathing, nonlabored ventilation and respiratory function stable Cardiovascular status: blood pressure returned to baseline and stable Postop Assessment: no apparent nausea or vomiting Anesthetic complications: no   No notable events documented.  Last Vitals:  Vitals:   01/01/22 1445 01/01/22 1500  BP: 131/86 118/74  Pulse: 77 74  Resp: 14 14  Temp:    SpO2: 100% 98%    Last Pain:  Vitals:   01/01/22 1500  TempSrc:   PainSc: Asleep                 Anuoluwapo Mefferd A.

## 2022-01-01 NOTE — Progress Notes (Signed)
Pharmacy Antibiotic Note  Wyoma Genson is a 26 y.o. female admitted on 12/31/2021 with  intra-abdominal infection .  Pharmacy has been consulted for Zosyn dosing.  Plan: Zosyn 3.375g IV q8h (4 hour infusion). No dose adjustments anticipated.  Pharmacy will sign off and monitor peripherally via electronic surveillance software for any changes in renal function or micro data.   Height: 4\' 11"  (149.9 cm) Weight: 58.1 kg (128 lb) IBW/kg (Calculated) : 43.2  Temp (24hrs), Avg:98.5 F (36.9 C), Min:97.9 F (36.6 C), Max:98.9 F (37.2 C)  Recent Labs  Lab 12/31/21 2026  WBC 10.3  CREATININE 0.75    Estimated Creatinine Clearance: 82.8 mL/min (by C-G formula based on SCr of 0.75 mg/dL).    Allergies  Allergen Reactions   Latex Itching and Other (See Comments)    Itching and yeast like symptoms with latex condom use.    Thank you for allowing pharmacy to be a part of this patient's care.  2027 PharmD 01/01/2022 5:43 AM

## 2022-01-01 NOTE — Consult Note (Signed)
CC: Abd pain  Requesting provider: Dr Jacqulyn Bath  HPI: Jamie Hansen is an 26 y.o. female who is here for RUQ pain.  This started 2 days ago.  She was seen at OSH and RUQ [place] was neg but LFT's were elevated.  She was treated for gastritis and sent home.  She then presented to the ED last night with worsening pain.  CT and RUQ US show gallbladder distention and early cholecystitis with gallstones seen.    Past Medical History:  Diagnosis Date   Bipolar 1 disorder (HCC)    Chlamydia    Complication of anesthesia    low BP, drowsy   Depression    Pregnancy induced hypertension     Past Surgical History:  Procedure Laterality Date   CESAREAN SECTION     HERNIA REPAIR     TUBAL LIGATION Bilateral 02/23/2017   Procedure: POST PARTUM TUBAL LIGATION;  Surgeon: Federico Flake, MD;  Location: [facility];  Service: Gynecology;  Laterality: Bilateral;    Family History  Problem Relation Age of Onset   Cancer Maternal Grandmother     Social:  reports that she has quit smoking. She has never used smokeless tobacco. She reports that she does not currently use alcohol. She reports that she does not use drugs.  Allergies:  Allergies  Allergen Reactions   Latex Itching and Other (See Comments)    Itching and yeast like symptoms with latex condom use.    Medications: I have reviewed the patient's current medications.  Results for orders placed or performed during the hospital encounter of 12/31/21 (from the past 48 hour(s))  Lipase, blood     Status: None   Collection Time: 12/31/21  8:26 PM  Result Value Ref Range   Lipase 36 11 - 51 U/L    Comment: Performed at [facility], [address] [place], [place] [address]  Comprehensive metabolic panel     Status: Abnormal   Collection Time: 12/31/21  8:26 PM  Result Value Ref Range   Sodium 136 135 - 145 mmol/L   Potassium 3.9 3.5 - 5.1 mmol/L   Chloride 100 98 - 111 mmol/L   CO2 25 22 - 32 mmol/L    Glucose, Bld 104 (H) 70 - 99 mg/dL    Comment: Glucose reference range applies only to samples taken after fasting for at least 8 hours.   BUN 13 6 - 20 mg/dL   Creatinine, Ser 1.24 0.44 - 1.00 mg/dL   Calcium 9.0 8.9 - 58.0 mg/dL   Total Protein 7.8 6.5 - 8.1 g/dL   Albumin 4.3 3.5 - 5.0 g/dL   AST 998 (H) 15 - 41 U/L   ALT 262 (H) 0 - 44 U/L   Alkaline Phosphatase 99 38 - 126 U/L   Total Bilirubin 1.0 0.3 - 1.2 mg/dL   GFR, Estimated >33 >82 mL/min    Comment: (NOTE) Calculated using the CKD-EPI Creatinine Equation (2021)    Anion gap 11 5 - 15    Comment: Performed at [facility], [address] [place], [place] [address]  CBC     Status: None   Collection Time: 12/31/21  8:26 PM  Result Value Ref Range   WBC 10.3 4.0 - 10.5 K/uL   RBC 4.94 3.87 - 5.11 MIL/uL   Hemoglobin 13.9 12.0 - 15.0 g/dL   HCT 76.7 34.1 - 93.7 %   MCV 82.4 80.0 - 100.0 fL  MCH 28.1 26.0 - 34.0 pg   MCHC 34.2 30.0 - 36.0 g/dL   RDW 93.7 16.9 - 67.8 %   Platelets 275 150 - 400 K/uL   nRBC 0.0 0.0 - 0.2 %    Comment: Performed at [facility], [address] [place], [place] [address]  Urinalysis, Routine w reflex microscopic Urine, Clean Catch     Status: Abnormal   Collection Time: 12/31/21  9:21 PM  Result Value Ref Range   Color, Urine YELLOW YELLOW   APPearance CLEAR CLEAR   Specific Gravity, Urine 1.020 1.005 - 1.030   pH 5.5 5.0 - 8.0   Glucose, UA NEGATIVE NEGATIVE mg/dL   Hgb urine dipstick TRACE (A) NEGATIVE   Bilirubin Urine NEGATIVE NEGATIVE   Ketones, ur NEGATIVE NEGATIVE mg/dL   Protein, ur NEGATIVE NEGATIVE mg/dL   Nitrite NEGATIVE NEGATIVE   Leukocytes,Ua NEGATIVE NEGATIVE    Comment: Performed at [facility], [address] [place], [place] [address]  Pregnancy, urine     Status: None   Collection Time: 12/31/21  9:21 PM  Result Value Ref Range   Preg Test, Ur NEGATIVE NEGATIVE    Comment:        THE SENSITIVITY OF THIS METHODOLOGY IS  >20 mIU/mL. Performed at [facility], [address] [place], [place] [address]   Urinalysis, Microscopic (reflex)     Status: Abnormal   Collection Time: 12/31/21  9:21 PM  Result Value Ref Range   RBC / HPF 0-5 0 - 5 RBC/hpf   WBC, UA 0-5 0 - 5 WBC/hpf   Bacteria, UA FEW (A) NONE SEEN   Squamous Epithelial / LPF 0-5 0 - 5    Comment: Performed at [facility], [address] [place], [place] [address]    US Abdomen Limited RUQ (LIVER/GB)  Result Date: 12/31/2021 CLINICAL DATA:  Right upper quadrant abdominal pain EXAM: ULTRASOUND ABDOMEN LIMITED RIGHT UPPER QUADRANT COMPARISON:  CT 9:56 p.m. FINDINGS: Gallbladder: The gallbladder is distended there is mild gallbladder wall thickening noted, and mild pericholecystic fluid is identified. No intraluminal stones or sludge is identified. The sonographic Eulah Pont sign is reportedly negative. Common bile duct: Diameter: 5-6 mm Liver: No focal lesion identified. Within normal limits in parenchymal echogenicity. Portal vein is patent on color Doppler imaging with normal direction of blood flow towards the liver. Other: None. IMPRESSION: 1. Distended gallbladder with mild gallbladder wall thickening and pericholecystic fluid. The findings can be seen in the setting of acute acalculous cholecystitis, though additional considerations should include inflammatory conditions of the liver or biliary tree. Correlation with liver enzymes may be helpful. Hepatic biliary scintigraphy may also be helpful to assess patency of the cystic duct. Electronically Signed   By: Helyn Numbers M.D.   On: 12/31/2021 23:28   CT ABDOMEN PELVIS W CONTRAST  Result Date: 12/31/2021 CLINICAL DATA:  Right upper quadrant abdominal pain EXAM: CT ABDOMEN AND PELVIS WITH CONTRAST TECHNIQUE: Multidetector CT imaging of the abdomen and pelvis was performed using the standard protocol following bolus administration of intravenous contrast. RADIATION DOSE REDUCTION:  This exam was performed according to the departmental dose-optimization program which includes automated exposure control, adjustment of the mA and/or kV according to patient size and/or use of iterative reconstruction technique. CONTRAST:  OMNIPAQUE IOHEXOL 300 MG/ML  SOLN COMPARISON:  None Available. FINDINGS: Lower chest: No acute abnormality. Hepatobiliary: No suspicious focal liver abnormality is seen. Gallbladder wall  thickening and intrahepatic biliary ductal dilation. Mild prominence of the common bile duct measuring 6 mm in diameter. No definite obstructing stone. Pancreas: Unremarkable. No pancreatic ductal dilatation or surrounding inflammatory changes. Spleen: Normal in size without focal abnormality. Adrenals/Urinary Tract: Adrenal glands are unremarkable. Kidneys are normal, without renal calculi, suspicious focal lesion, or hydronephrosis. Bladder is unremarkable. Stomach/Bowel: Stomach is within normal limits. No evidence of bowel wall thickening, distention, or inflammatory changes. The appendix is normal. Moderate colonic stool load. Fecalization of the terminal ileum. Vascular/Lymphatic: No significant vascular findings are present. No enlarged abdominal or pelvic lymph nodes. Reproductive: Tubal ligation. Unremarkable appearance of the right ovary and uterus. Mass within the left ovary with fat density compatible with dermoid measuring 3.0 cm. This has increased in size since ultrasound 07/11/2016. Other: Small amount of free fluid in the pelvis, likely physiologic. No free intraperitoneal air. Musculoskeletal: No acute or significant osseous findings. IMPRESSION: Findings suggestive of acute cholecystitis. No definite cholelithiasis. Fat density mass in the right ovary consistent with dermoid cyst, increased since 07/11/2016. Moderate fecal burden with fecalization of the terminal ileum. Recommend correlation for constipation. Electronically Signed   By: Minerva Fester M.D.   On:  12/31/2021 22:19    ROS - all of the below systems have been reviewed with the patient and positives are indicated with bold text General: chills, fever or night sweats Eyes: blurry vision or double vision ENT: epistaxis or sore throat Hematologic/Lymphatic: bleeding problems, blood clots or swollen lymph nodes Endocrine: temperature intolerance or unexpected weight changes Breast: new or changing breast lumps or nipple discharge Resp: cough, shortness of breath, or wheezing CV: chest pain or dyspnea on exertion GI: as per HPI GU: dysuria, trouble voiding, or hematuria Neuro: TIA or stroke symptoms    PE Blood pressure 107/63, pulse 72, temperature 98.9 F (37.2 C), temperature source Oral, resp. rate 18, height 4\' 11"  (1.499 m), weight 58.1 kg, last menstrual period 12/11/2021, SpO2 97 %. Constitutional: NAD; conversant; no deformities Eyes: Moist conjunctiva; no lid lag; anicteric; PERRL Neck: Trachea midline; no thyromegaly Lungs: Normal respiratory effort CV: RRR GI: Abd soft, TTP epigastrium MSK: Normal range of motion of extremities; no clubbing/cyanosis Psychiatric: Appropriate affect; alert and oriented x3  Results for orders placed or performed during the hospital encounter of 12/31/21 (from the past 48 hour(s))  Lipase, blood     Status: None   Collection Time: 12/31/21  8:26 PM  Result Value Ref Range   Lipase 36 11 - 51 U/L    Comment: Performed at [facility], [address] [place], [place]  Comprehensive metabolic panel     Status: Abnormal   Collection Time: 12/31/21  8:26 PM  Result Value Ref Range   Sodium 136 135 - 145 mmol/L   Potassium 3.9 3.5 - 5.1 mmol/L   Chloride 100 98 - 111 mmol/L   CO2 25 22 - 32 mmol/L   Glucose, Bld 104 (H) 70 - 99 mg/dL    Comment: Glucose reference range applies only to samples taken after fasting for at least 8 hours.   BUN 13 6 - 20 mg/dL   Creatinine, Ser 13/09/23 0.44 - 1.00 mg/dL   Calcium 9.0 8.9 -  3.87 mg/dL   Total Protein 7.8 6.5 - 8.1 g/dL   Albumin 4.3 3.5 - 5.0 g/dL   AST 56.4 (H) 15 - 41 U/L   ALT 262 (H) 0 - 44 U/L   Alkaline Phosphatase 99 38 -  126 U/L   Total Bilirubin 1.0 0.3 - 1.2 mg/dL   GFR, Estimated >28 >00 mL/min    Comment: (NOTE) Calculated using the CKD-EPI Creatinine Equation (2021)    Anion gap 11 5 - 15    Comment: Performed at [facility], [address] [place], [place] [address]  CBC     Status: None   Collection Time: 12/31/21  8:26 PM  Result Value Ref Range   WBC 10.3 4.0 - 10.5 K/uL   RBC 4.94 3.87 - 5.11 MIL/uL   Hemoglobin 13.9 12.0 - 15.0 g/dL   HCT 91.5 05.6 - 97.9 %   MCV 82.4 80.0 - 100.0 fL   MCH 28.1 26.0 - 34.0 pg   MCHC 34.2 30.0 - 36.0 g/dL   RDW 48.0 16.5 - 53.7 %   Platelets 275 150 - 400 K/uL   nRBC 0.0 0.0 - 0.2 %    Comment: Performed at [facility], [address] [place], [place] [address]  Urinalysis, Routine w reflex microscopic Urine, Clean Catch     Status: Abnormal   Collection Time: 12/31/21  9:21 PM  Result Value Ref Range   Color, Urine YELLOW YELLOW   APPearance CLEAR CLEAR   Specific Gravity, Urine 1.020 1.005 - 1.030   pH 5.5 5.0 - 8.0   Glucose, UA NEGATIVE NEGATIVE mg/dL   Hgb urine dipstick TRACE (A) NEGATIVE   Bilirubin Urine NEGATIVE NEGATIVE   Ketones, ur NEGATIVE NEGATIVE mg/dL   Protein, ur NEGATIVE NEGATIVE mg/dL   Nitrite NEGATIVE NEGATIVE   Leukocytes,Ua NEGATIVE NEGATIVE    Comment: Performed at [facility], [address] [place], [place] [address]  Pregnancy, urine     Status: None   Collection Time: 12/31/21  9:21 PM  Result Value Ref Range   Preg Test, Ur NEGATIVE NEGATIVE    Comment:        THE SENSITIVITY OF THIS METHODOLOGY IS >20 mIU/mL. Performed at [facility], [address] [place], [place] [address]   Urinalysis, Microscopic (reflex)     Status: Abnormal   Collection Time: 12/31/21  9:21 PM  Result Value Ref Range   RBC /  HPF 0-5 0 - 5 RBC/hpf   WBC, UA 0-5 0 - 5 WBC/hpf   Bacteria, UA FEW (A) NONE SEEN   Squamous Epithelial / LPF 0-5 0 - 5    Comment: Performed at [facility], [address] [place], [place] [address]    US Abdomen Limited RUQ (LIVER/GB)  Result Date: 12/31/2021 CLINICAL DATA:  Right upper quadrant abdominal pain EXAM: ULTRASOUND ABDOMEN LIMITED RIGHT UPPER QUADRANT COMPARISON:  CT 9:56 p.m. FINDINGS: Gallbladder: The gallbladder is distended there is mild gallbladder wall thickening noted, and mild pericholecystic fluid is identified. No intraluminal stones or sludge is identified. The sonographic Eulah Pont sign is reportedly negative. Common bile duct: Diameter: 5-6 mm Liver: No focal lesion identified. Within normal limits in parenchymal echogenicity. Portal vein is patent on color Doppler imaging with normal direction of blood flow towards the liver. Other: None. IMPRESSION: 1. Distended gallbladder with mild gallbladder wall thickening and pericholecystic fluid. The findings can be seen in the setting of acute acalculous cholecystitis, though additional considerations should include inflammatory conditions of the liver or biliary tree. Correlation with liver enzymes may be helpful. Hepatic biliary scintigraphy may also be helpful to assess patency of the cystic duct. Electronically Signed   By:  Ashesh  Parikh M.D.   On: 12/31/2021 23:28   CT ABDOMEN PELVIS W CONTRAST  Result Date: 12/31/2021 CLINICAL DATA:  Right upper quadrant abdominal pain EXAM: CT ABDOMEN AND PELVIS WITH CONTRAST TECHNIQUE: Multidetector CT imaging of the abdomen and pelvis was performed using the standard protocol following bolus administration of intravenous contrast. RADIATION DOSE REDUCTION: This exam was performed according to the departmental dose-optimization program which includes automated exposure control, adjustment of the mA and/or kV according to patient size and/or use of iterative reconstruction  technique. CONTRAST:  100mL OMNIPAQUE IOHEXOL 300 MG/ML  SOLN COMPARISON:  None Available. FINDINGS: Lower chest: No acute abnormality. Hepatobiliary: No suspicious focal liver abnormality is seen. Gallbladder wall thickening and intrahepatic biliary ductal dilation. Mild prominence of the common bile duct measuring 6 mm in diameter. No definite obstructing stone. Pancreas: Unremarkable. No pancreatic ductal dilatation or surrounding inflammatory changes. Spleen: Normal in size without focal abnormality. Adrenals/Urinary Tract: Adrenal glands are unremarkable. Kidneys are normal, without renal calculi, suspicious focal lesion, or hydronephrosis. Bladder is unremarkable. Stomach/Bowel: Stomach is within normal limits. No evidence of bowel wall thickening, distention, or inflammatory changes. The appendix is normal. Moderate colonic stool load. Fecalization of the terminal ileum. Vascular/Lymphatic: No significant vascular findings are present. No enlarged abdominal or pelvic lymph nodes. Reproductive: Tubal ligation. Unremarkable appearance of the right ovary and uterus. Mass within the left ovary with fat density compatible with dermoid measuring 3.0 cm. This has increased in size since ultrasound 07/11/2016. Other: Small amount of free fluid in the pelvis, likely physiologic. No free intraperitoneal air. Musculoskeletal: No acute or significant osseous findings. IMPRESSION: Findings suggestive of acute cholecystitis. No definite cholelithiasis. Fat density mass in the right ovary consistent with dermoid cyst, increased since 07/11/2016. Moderate fecal burden with fecalization of the terminal ileum. Recommend correlation for constipation. Electronically Signed   By: Tyler  Stutzman M.D.   On: 12/31/2021 22:19     A/P: Jamie Hansen is an 25 y.o. female with what appears to be early acalculous cholecystitis.  Cont NPO and IV abx.  Will discuss with oncoming team.    Orby Tangen C Theador Jezewski, MD  Colorectal  and General Surgery [facility]  Total time of evaluation, examination, counseling and implementing medical decisions was 55 mins.  moderate decision making.  

## 2022-01-01 NOTE — Op Note (Signed)
Operative Note  Jamie Hansen 26 y.o. female [id number]  01/01/2022  Surgeon: Berna Bue MD FACS  Procedure performed: Laparoscopic Cholecystectomy with intraoperative cholangiogram and near infrared fluorescent cholangioscopy  Procedure classification: Emergent  Preop diagnosis: Cholecystitis Post-op diagnosis/intraop findings: Choledocholithiasis  Specimens: gallbladder  Retained items: none  EBL: minimal  Complications: none  Description of procedure: After obtaining informed consent the patient was brought to the operating room. Antibiotics were administered. SCD's were applied. General endotracheal anesthesia was initiated and a formal time-out was performed. The abdomen was prepped and draped in the usual sterile fashion and the abdomen was entered using an infraumbilical veress needle after instilling the site with local. Insufflation to was obtained, 51mm trocar and camera inserted, and gross inspection revealed no evidence of injury from our entry or other intraabdominal abnormalities.  Of note, the uterus is adherent to the lower midline essentially up to the level of the umbilicus, there is additionally an omental adhesion in the left tube and ovary are adherent to the left lower quadrant abdominal wall.  Attempts to take photographs were made but there were technical difficulties with the equipment.  Two 66mm trocars were introduced in the right midclavicular and right anterior axillary lines under direct visualization and following infiltration with local. A 61mm trocar was placed in the epigastrium. The gallbladder fundus was retracted cephalad and the infundibulum was retracted laterally. A combination of hook electrocautery and blunt dissection was utilized to clear the peritoneum from the neck and cystic duct, circumferentially isolating the cystic artery and cystic duct and lifting the gallbladder from the cystic plate. The critical view of safety was  achieved with the cystic artery, cystic duct, and liver bed visualized between them with no other structures.  This was concordant with the view on near infrared fluorescent cholangiography.  The artery was clipped with a single clip proximally and distally and divided.  A clip was placed across the junction of the gallbladder neck and cystic duct and a ductotomy was made.  The cholangiogram catheter was inserted and secured with a clip.  Cholangiogram was then performed demonstrating typical biliary anatomy and eventual emptying into the duodenum however, there appeared to be a round structure in the common bile duct consistent with choledocholithiasis.  The cholangiogram catheter and associated clip were removed and 3 clips were placed across the cystic duct.  The cystic duct was then divided sharply.  The gallbladder was dissected from the liver plate using electrocautery. Once freed the gallbladder was placed in an endocatch bag and removed intact through the epigastric trocar site.  The liver bed was reinspected and confirmed to be hemostatic, there is no bile leak from the liver bed or the cystic duct stump.  Hemostasis was once again confirmed, and reinspection of the abdomen revealed no injuries. The clips were well opposed without any bile leak from the duct or the liver bed. The 10mm trocar site in the epigastrium was closed with a 0 vicryl in the fascia under direct visualization using a PMI device. The abdomen was desufflated and all trocars removed. The skin incisions were closed with running subcuticular monocryl and Dermabond. The patient was awakened, extubated and transported to the recovery room in stable condition.    All counts were correct at the completion of the case.

## 2022-01-01 NOTE — Anesthesia Procedure Notes (Signed)
Procedure Name: Intubation Date/Time: 01/01/2022 1:14 PM  Performed by: Florene Route, CRNAPre-anesthesia Checklist: Patient identified, Emergency Drugs available, Suction available and Patient being monitored Patient Re-evaluated:Patient Re-evaluated prior to induction Oxygen Delivery Method: Circle system utilized Preoxygenation: Pre-oxygenation with 100% oxygen Induction Type: IV induction, Rapid sequence and Cricoid Pressure applied Laryngoscope Size: Miller and 2 Grade View: Grade I Tube type: Oral Tube size: 7.0 mm Number of attempts: 1 Airway Equipment and Method: Stylet and Oral airway Placement Confirmation: ETT inserted through vocal cords under direct vision, positive ETCO2 and breath sounds checked- equal and bilateral Secured at: 21 cm Tube secured with: Tape Dental Injury: Teeth and Oropharynx as per pre-operative assessment

## 2022-01-01 NOTE — Discharge Instructions (Addendum)
[facility]  Please arrive at least 30 min before your appointment to complete your check in paperwork.  If you are unable to arrive 30 min prior to your appointment time we may have to cancel or reschedule you. LAPAROSCOPIC SURGERY: POST OP INSTRUCTIONS Always review your discharge instruction sheet given to you by the facility where your surgery was performed. IF YOU HAVE DISABILITY OR FAMILY LEAVE FORMS, YOU MUST BRING THEM TO THE OFFICE FOR PROCESSING.   DO NOT GIVE THEM TO YOUR DOCTOR.  PAIN CONTROL  First take acetaminophen (Tylenol) AND/or ibuprofen (Advil) to control your pain after surgery.  Follow directions on package.  Taking acetaminophen (Tylenol) and/or ibuprofen (Advil) regularly after surgery will help to control your pain and lower the amount of prescription pain medication you may need.  You should not take more than 4,000 mg (4 grams) of acetaminophen (Tylenol) in 24 hours.  You should not take ibuprofen (Advil), aleve, motrin, naprosyn or other NSAIDS if you have a history of stomach ulcers or chronic kidney disease.  A prescription for pain medication may be given to you upon discharge.  Take your pain medication as prescribed, if you still have uncontrolled pain after taking acetaminophen (Tylenol) or ibuprofen (Advil). Use ice packs to help control pain. If you need a refill on your pain medication, please contact your pharmacy.  They will contact our office to request authorization. Prescriptions will not be filled after 5pm or on week-ends.  HOME MEDICATIONS Take your usually prescribed medications unless otherwise directed.  DIET You should follow a light diet the first few days after arrival home.  Be sure to include lots of fluids daily. Avoid fatty, fried foods.   CONSTIPATION It is common to experience some constipation after surgery and if you are taking pain medication.  Increasing fluid intake and taking a stool softener (such as Colace)  will usually help or prevent this problem from occurring.  A mild laxative (Milk of Magnesia or Miralax) should be taken according to package instructions if there are no bowel movements after 48 hours.  WOUND/INCISION CARE Most patients will experience some swelling and bruising in the area of the incisions.  Ice packs will help.  Swelling and bruising can take several days to resolve.  Unless discharge instructions indicate otherwise, follow guidelines below  STERI-STRIPS - you may remove your outer bandages 48 hours after surgery, and you may shower at that time.  You have steri-strips (small skin tapes) in place directly over the incision.  These strips should be left on the skin for 7-10 days.   DERMABOND/SKIN GLUE - you may shower in 24 hours.  The glue will flake off over the next 2-3 weeks. Any sutures or staples will be removed at the office during your follow-up visit.  ACTIVITIES You may resume regular (light) daily activities beginning the next day--such as daily self-care, walking, climbing stairs--gradually increasing activities as tolerated.  You may have sexual intercourse when it is comfortable.  Refrain from any heavy lifting or straining until approved by your doctor. You may drive when you are no longer taking prescription pain medication, you can comfortably wear a seatbelt, and you can safely maneuver your car and apply brakes.  FOLLOW-UP You should see your doctor in the office for a follow-up appointment approximately 2-3 weeks after your surgery.  You should have been given your post-op/follow-up appointment when your surgery was scheduled.  If you did not receive a post-op/follow-up appointment, make sure   that you call for this appointment within a day or two after you arrive home to insure a convenient appointment time.   WHEN TO CALL YOUR DOCTOR: Fever over 101.0 Inability to urinate Continued bleeding from incision. Increased pain, redness, or drainage from the  incision. Increasing abdominal pain  The clinic staff is available to answer your questions during regular business hours.  Please don't hesitate to call and ask to speak to one of the nurses for clinical concerns.  If you have a medical emergency, go to the nearest emergency room or call 911.  A surgeon from [facility] is always on call at the hospital. [address], [address], [place], [place]  [address] ? [address] [address], , [place]   [address] [phone] ? [phone] ? FAX [phone]    CIRUGIA LAPAROSCOPICA: INSTRUCCIONES DE POST OPERATORIO.  Revise siempre los documentos que le entreguen en el lugar donde se ha hecho la cirugia.  SI USTED NECESITA DOCUMENTOS DE INCAPACIDAD (DISABLE) O DE PERMISO FAMILAR (FAMILY LEAVE) NECESITA TRAERLOS A LA OFICINA PARA QUE SEAN PROCESADOS. NO  SE LOS DE A SU DOCTOR. A su alta del hospital se le dara una receta para controlar el dolor. Tomela como ha sido recetada, si la necesita. Si no la necesita puede tomar, Acetaminofen (Tylenol) o Ibuprofen (Advil) para aliviar dolor moderado. Continue tomando el resto de sus medicinas. Si necesita rellenar la receta, llame a la farmacia. ellos contactan a nuestra oficina pidiendo autorizacion. Este tipo de receta no pueden ser rellenadas despues de las  5pm o durante los fines de semana. Con relacion a la dieta: debe ser ligera los primeros dias despues que llege a la casa. Ejemplo: sopas y galleticas. Tome bastante liquido esos dias. La mayoria de los pacientes padecen de inflamacion y cambio de coloracion de la piel alrededor de las incisiones. esto toma dias en resolver.  pnerse una bolsa de hielo en el area affectada ayuda..  Es comun tambien tener un poco de estrenimiento si esta tomado medicinas para el dolor. incremente la cantidad de liquidos a tomar y puede tomar (Colace) esto previene el problema. Si ya tiene estrenimiento, es decir no ha defecado en 48 horas, puede tomar un  laxativo (Milk of Magnesia or Miralax) uselo como el paquete le explica.  A menos que se le diga algo diferente. Remueva el bendaje a las 24-48 horas despues dela cirugia. y puede banarse en la ducha sin ningun problema. usted puede tener steri-strips (pequenas curitas transparentes en la piel puesta encima de la incision)  Estas banditas strips should be left on the skin for 7-10 days.   Si su cirujano puso pegamento encima de la incision usted puede banarse bajo la ducha en 24 horas. Este pegamento empezara a caerse en las proximas 2-3 semanas. Si le pusieron suturas o presillas (grapos) estos seran quitados en su proxima cita en la oficina. . ACTIVIDADES:  Puede hacer actividad ligera.  Como caminar , subir escaleras y poco a poco irlas incrementando tanto como las tolere. Puede tener relaciones sexuales cuando sea comfortable. No carge objetos pesados o haga esfuerzos que no sean aprovados por su doctor. Puede manejar en cuanto no esta tomando medicamentos fuertes (narcoticos) para el dolor, pueda abrochar confortablemente el cinturon de seguridad, y pueda maniobrar y usar los pedales de su vehiculo con seguridad. PUEDE REGRESAR A TRABAJAR  Debe ver a su doctor para una cita de seguimiento en 2-3 semanas despues de la cirugia.  OTRAS ISNSTRUCCIONES:___________________________________________________________________________________ CUANDO   LLAMAR A SU MEDICO: FIEBRE mayor de  101.0 No produccion de orina. Sangramiento continue de la herida Incremento de dolor, enrojecimientio o drenaje de la herida (incision) Incremento de dolor abdominal.  The clinic staff is available to answer your questions during regular business hours.  Please don't hesitate to call and ask to speak to one of the nurses for clinical concerns.  If you have a medical emergency, go to the nearest emergency room or call 911.  A surgeon from [facility] is always on call at the hospital. [address], [address], [place], [place]  [address] ? [address] [address], [place], [place]   [address] [phone] ? [phone] ? FAX [phone] Web site: [email or web address]  

## 2022-01-01 NOTE — Anesthesia Preprocedure Evaluation (Addendum)
Anesthesia Evaluation  Patient identified by MRN, date of birth, ID band Patient awake    Reviewed: Allergy & Precautions, NPO status , Patient's Chart, lab work & pertinent test results  History of Anesthesia Complications (+) history of anesthetic complications  Airway Mallampati: I  TM Distance: >3 FB Neck ROM: Full    Dental no notable dental hx. (+) Teeth Intact, Dental Advisory Given   Pulmonary former smoker   Pulmonary exam normal breath sounds clear to auscultation       Cardiovascular hypertension, Pt. on medications Normal cardiovascular exam Rhythm:Regular Rate:Normal     Neuro/Psych  PSYCHIATRIC DISORDERS Anxiety Depression Bipolar Disorder   negative neurological ROS     GI/Hepatic Neg liver ROS,GERD  Medicated,,  Endo/Other    Renal/GU negative Renal ROS     Musculoskeletal negative musculoskeletal ROS (+)    Abdominal  (+)  Abdomen: tender.   Peds  Hematology  (+) Blood dyscrasia, anemia   Anesthesia Other Findings   Reproductive/Obstetrics negative OB ROS Previous C/Section PIH Desires Sterilization                             Anesthesia Physical Anesthesia Plan  ASA: 2  Anesthesia Plan: General   Post-op Pain Management: Tylenol PO (pre-op)* and Gabapentin PO (pre-op)*   Induction: Intravenous  PONV Risk Score and Plan: 4 or greater and Ondansetron, Treatment may vary due to age or medical condition, Midazolam, Dexamethasone and Scopolamine patch - Pre-op  Airway Management Planned: Oral ETT  Additional Equipment:   Intra-op Plan:   Post-operative Plan: Extubation in OR  Informed Consent: I have reviewed the patients History and Physical, chart, labs and discussed the procedure including the risks, benefits and alternatives for the proposed anesthesia with the patient or authorized representative who has indicated his/her understanding and acceptance.      Dental advisory given  Plan Discussed with: CRNA  Anesthesia Plan Comments:         Anesthesia Quick Evaluation

## 2022-01-01 NOTE — Assessment & Plan Note (Signed)
Empiric zosyn Repeat CMP this AM to see where LFTs are NPO IVF Will order MRCP to r/o CBD stone Surgery consulted by EDP Morphine PRN pain

## 2022-01-01 NOTE — Transfer of Care (Signed)
Immediate Anesthesia Transfer of Care Note  Patient: Jamie Hansen  Procedure(s) Performed: LAPAROSCOPIC CHOLECYSTECTOMY WITH ICG DYE AND INTRAOPERATIVE CHOLANGIOGRAM  Patient Location: PACU  Anesthesia Type:General  Level of Consciousness: drowsy  Airway & Oxygen Therapy: Patient Spontanous Breathing and Patient connected to face mask oxygen  Post-op Assessment: Report given to RN and Post -op Vital signs reviewed and stable  Post vital signs: Reviewed and stable  Last Vitals:  Vitals Value Taken Time  BP 144/81 01/01/22 1428  Temp    Pulse 96 01/01/22 1429  Resp 16 01/01/22 1429  SpO2 100 % 01/01/22 1429  Vitals shown include unvalidated device data.  Last Pain:  Vitals:   01/01/22 0910  TempSrc:   PainSc: 5          Complications: No notable events documented.

## 2022-01-01 NOTE — Interval H&P Note (Signed)
History and Physical Interval Note:  01/01/2022 11:47 AM  Jamie Hansen  has presented today for surgery, with the diagnosis of Acalculous cholecystitis.  The various methods of treatment have been discussed with the patient and family. After consideration of risks, benefits and other options for treatment, the patient has consented to  Procedure(s): LAPAROSCOPIC CHOLECYSTECTOMY (N/A) as a surgical intervention.  The patient's history has been reviewed, patient examined, no change in status, stable for surgery.  I have reviewed the patient's chart and labs.  Questions were answered to the patient's satisfaction.     Quamaine Webb Lollie Sails

## 2022-01-01 NOTE — H&P (Signed)
History and Physical    Patient: Jamie Hansen BPZ:[id number] DOB: 1996/02/16 DOA: 12/31/2021 DOS: the patient was seen and examined on 01/01/2022 PCP: Patient, No Pcp Per  Patient coming from: Home  Chief Complaint:  Chief Complaint  Patient presents with   Abdominal Pain   HPI: Jamie Hansen is a 26 y.o. female with medical history significant of previously healthy.  Pt presents to ED at med center with c/o RUQ abd pain.  Pain onset ~48h ago, seen at OSH and had RUQ US performed, discharged with diagnosis of gastritis.  Trying to take meds at home, able to eat 2 small meals but then had worsening pain, so back in to ED.  Some N/V, no diarrhea.  Work up in ED was c/w acute acalculous cholecystitis with LFT elevations.  Gen surg asked for medicine admission.  Review of Systems: As mentioned in the history of present illness. All other systems reviewed and are negative. Past Medical History:  Diagnosis Date   Bipolar 1 disorder (HCC)    Chlamydia    Complication of anesthesia    low BP, drowsy   Depression    Pregnancy induced hypertension    Past Surgical History:  Procedure Laterality Date   CESAREAN SECTION     HERNIA REPAIR     TUBAL LIGATION Bilateral 02/23/2017   Procedure: POST PARTUM TUBAL LIGATION;  Surgeon: Federico Flake, MD;  Location: [facility];  Service: Gynecology;  Laterality: Bilateral;   Social History:  reports that she has quit smoking. She has never used smokeless tobacco. She reports that she does not currently use alcohol. She reports that she does not use drugs.  Allergies  Allergen Reactions   Latex Itching and Other (See Comments)    Itching and yeast like symptoms with latex condom use.    Family History  Problem Relation Age of Onset   Cholecystitis Sister    Cancer Maternal Grandmother     Prior to Admission medications   Medication Sig Start Date End Date Taking? Authorizing Provider   amoxicillin-clavulanate (AUGMENTIN) 875-125 MG tablet Take 1 tablet by mouth 2 (two) times daily. One po bid x 7 days 08/27/18   Loren Racer, MD  cephALEXin (KEFLEX) 500 MG capsule Take 1 capsule (500 mg total) by mouth 4 (four) times daily. 05/26/17   Gwyneth Sprout, MD  guaiFENesin-codeine (ROBITUSSIN AC) 100-10 MG/5ML syrup Take 5 mLs by mouth 3 (three) times daily as needed for cough. 03/10/18   Renne Crigler, PA-C  hydrocortisone cream 1 % Apply to affected area 2 times daily 08/04/21   Leaphart, Lynann Beaver, PA-C  ibuprofen (ADVIL,MOTRIN) 600 MG tablet Take 1 tablet (600 mg total) by mouth every 6 (six) hours. 02/25/17   Aviva Signs, CNM  ondansetron (ZOFRAN-ODT) 4 MG disintegrating tablet Take 1 tablet (4 mg total) by mouth every 8 (eight) hours as needed for nausea or vomiting. 08/28/20   Benjiman Core, MD  valACYclovir (VALTREX) 1000 MG tablet Take 1 tablet (1,000 mg total) by mouth 3 (three) times daily. 06/04/17   Jacalyn Lefevre, MD    Physical Exam: Vitals:   01/01/22 0130 01/01/22 0230 01/01/22 0300 01/01/22 0431  BP: 100/61 102/64 107/63 101/61  Pulse: 69 68 72 63  Resp: 18   15  Temp:    97.9 F (36.6 C)  TempSrc:      SpO2: 95% 98% 97% 99%  Weight:      Height:       Constitutional: NAD, calm, comfortable  Eyes: PERRL, lids and conjunctivae normal ENMT: Mucous membranes are moist. Posterior pharynx clear of any exudate or lesions.Normal dentition.  Neck: normal, supple, no masses, no thyromegaly Respiratory: clear to auscultation bilaterally, no wheezing, no crackles. Normal respiratory effort. No accessory muscle use.  Cardiovascular: Regular rate and rhythm, no murmurs / rubs / gallops. No extremity edema. 2+ pedal pulses. No carotid bruits.  Abdomen: RUQ TTP Musculoskeletal: no clubbing / cyanosis. No joint deformity upper and lower extremities. Good ROM, no contractures. Normal muscle tone.  Skin: no rashes, lesions, ulcers. No induration Neurologic: CN 2-12  grossly intact. Sensation intact, DTR normal. Strength 5/5 in all 4.  Psychiatric: Normal judgment and insight. Alert and oriented x 3. Normal mood.   Data Reviewed:    CBC    Component Value Date/Time   WBC 10.3 12/31/2021 2026   RBC 4.94 12/31/2021 2026   HGB 13.9 12/31/2021 2026   HCT 40.7 12/31/2021 2026   PLT 275 12/31/2021 2026   MCV 82.4 12/31/2021 2026   MCH 28.1 12/31/2021 2026   MCHC 34.2 12/31/2021 2026   RDW 13.5 12/31/2021 2026   LYMPHSABS 2.6 04/27/2021 1259   MONOABS 0.3 04/27/2021 1259   EOSABS 0.2 04/27/2021 1259   BASOSABS 0.0 04/27/2021 1259   CMP     Component Value Date/Time   NA 136 12/31/2021 2026   K 3.9 12/31/2021 2026   CL 100 12/31/2021 2026   CO2 25 12/31/2021 2026   GLUCOSE 104 (H) 12/31/2021 2026   BUN 13 12/31/2021 2026   CREATININE 0.75 12/31/2021 2026   CALCIUM 9.0 12/31/2021 2026   PROT 7.8 12/31/2021 2026   ALBUMIN 4.3 12/31/2021 2026   AST 102 (H) 12/31/2021 2026   ALT 262 (H) 12/31/2021 2026   ALKPHOS 99 12/31/2021 2026   BILITOT 1.0 12/31/2021 2026   GFRNONAA >60 12/31/2021 2026   GFRAA >60 09/27/2017 2033   CT AP = Acute acalculous cholecystitis  RUQ [place] = acute acalculous cholecystitis  Assessment and Plan: * Acalculous cholecystitis Empiric zosyn Repeat CMP this AM to see where LFTs are NPO IVF Will order MRCP to r/o CBD stone Surgery consulted by EDP Morphine PRN pain      Advance Care Planning:   Code Status: Full Code  Consults: EDP spoke with gen surg  Family Communication: No family in room  Severity of Illness: The appropriate patient status for this patient is INPATIENT. Inpatient status is judged to be reasonable and necessary in order to provide the required intensity of service to ensure the patient's safety. The patient's presenting symptoms, physical exam findings, and initial radiographic and laboratory data in the context of their chronic comorbidities is felt to place them at high risk for  further clinical deterioration. Furthermore, it is not anticipated that the patient will be medically stable for discharge from the hospital within 2 midnights of admission.   * I certify that at the point of admission it is my clinical judgment that the patient will require inpatient hospital care spanning beyond 2 midnights from the point of admission due to high intensity of service, high risk for further deterioration and high frequency of surveillance required.*  Author: Hillary Bow., DO 01/01/2022 5:53 AM  For on call review [email or web address].

## 2022-01-01 NOTE — Progress Notes (Signed)
  Transition of Care [facility]) Screening Note   Patient Details  Name: Jamie Hansen Date of Birth: Jul 23, 1995   Transition of Care [facility]) CM/SW Contact:    Amada Jupiter, LCSW Phone Number: 01/01/2022, 4:03 PM    Transition of Care Department [facility]) has reviewed patient and no TOC needs have been identified at this time. We will continue to monitor patient advancement through interdisciplinary progression rounds. If new patient transition needs arise, please place a TOC consult.

## 2022-01-01 NOTE — H&P (View-Only) (Signed)
CC: Abd pain  Requesting provider: Dr Jacqulyn Bath  HPI: Gemma Ruan is an 26 y.o. female who is here for RUQ pain.  This started 2 days ago.  She was seen at OSH and RUQ [place] was neg but LFT's were elevated.  She was treated for gastritis and sent home.  She then presented to the ED last night with worsening pain.  CT and RUQ US show gallbladder distention and early cholecystitis with gallstones seen.    Past Medical History:  Diagnosis Date   Bipolar 1 disorder (HCC)    Chlamydia    Complication of anesthesia    low BP, drowsy   Depression    Pregnancy induced hypertension     Past Surgical History:  Procedure Laterality Date   CESAREAN SECTION     HERNIA REPAIR     TUBAL LIGATION Bilateral 02/23/2017   Procedure: POST PARTUM TUBAL LIGATION;  Surgeon: Federico Flake, MD;  Location: [facility];  Service: Gynecology;  Laterality: Bilateral;    Family History  Problem Relation Age of Onset   Cancer Maternal Grandmother     Social:  reports that she has quit smoking. She has never used smokeless tobacco. She reports that she does not currently use alcohol. She reports that she does not use drugs.  Allergies:  Allergies  Allergen Reactions   Latex Itching and Other (See Comments)    Itching and yeast like symptoms with latex condom use.    Medications: I have reviewed the patient's current medications.  Results for orders placed or performed during the hospital encounter of 12/31/21 (from the past 48 hour(s))  Lipase, blood     Status: None   Collection Time: 12/31/21  8:26 PM  Result Value Ref Range   Lipase 36 11 - 51 U/L    Comment: Performed at [facility], [address] [place], [place] [address]  Comprehensive metabolic panel     Status: Abnormal   Collection Time: 12/31/21  8:26 PM  Result Value Ref Range   Sodium 136 135 - 145 mmol/L   Potassium 3.9 3.5 - 5.1 mmol/L   Chloride 100 98 - 111 mmol/L   CO2 25 22 - 32 mmol/L    Glucose, Bld 104 (H) 70 - 99 mg/dL    Comment: Glucose reference range applies only to samples taken after fasting for at least 8 hours.   BUN 13 6 - 20 mg/dL   Creatinine, Ser 1.24 0.44 - 1.00 mg/dL   Calcium 9.0 8.9 - 58.0 mg/dL   Total Protein 7.8 6.5 - 8.1 g/dL   Albumin 4.3 3.5 - 5.0 g/dL   AST 998 (H) 15 - 41 U/L   ALT 262 (H) 0 - 44 U/L   Alkaline Phosphatase 99 38 - 126 U/L   Total Bilirubin 1.0 0.3 - 1.2 mg/dL   GFR, Estimated >33 >82 mL/min    Comment: (NOTE) Calculated using the CKD-EPI Creatinine Equation (2021)    Anion gap 11 5 - 15    Comment: Performed at [facility], [address] [place], [place] [address]  CBC     Status: None   Collection Time: 12/31/21  8:26 PM  Result Value Ref Range   WBC 10.3 4.0 - 10.5 K/uL   RBC 4.94 3.87 - 5.11 MIL/uL   Hemoglobin 13.9 12.0 - 15.0 g/dL   HCT 76.7 34.1 - 93.7 %   MCV 82.4 80.0 - 100.0 fL  MCH 28.1 26.0 - 34.0 pg   MCHC 34.2 30.0 - 36.0 g/dL   RDW 93.7 16.9 - 67.8 %   Platelets 275 150 - 400 K/uL   nRBC 0.0 0.0 - 0.2 %    Comment: Performed at [facility], [address] [place], [place] [address]  Urinalysis, Routine w reflex microscopic Urine, Clean Catch     Status: Abnormal   Collection Time: 12/31/21  9:21 PM  Result Value Ref Range   Color, Urine YELLOW YELLOW   APPearance CLEAR CLEAR   Specific Gravity, Urine 1.020 1.005 - 1.030   pH 5.5 5.0 - 8.0   Glucose, UA NEGATIVE NEGATIVE mg/dL   Hgb urine dipstick TRACE (A) NEGATIVE   Bilirubin Urine NEGATIVE NEGATIVE   Ketones, ur NEGATIVE NEGATIVE mg/dL   Protein, ur NEGATIVE NEGATIVE mg/dL   Nitrite NEGATIVE NEGATIVE   Leukocytes,Ua NEGATIVE NEGATIVE    Comment: Performed at [facility], [address] [place], [place] [address]  Pregnancy, urine     Status: None   Collection Time: 12/31/21  9:21 PM  Result Value Ref Range   Preg Test, Ur NEGATIVE NEGATIVE    Comment:        THE SENSITIVITY OF THIS METHODOLOGY IS  >20 mIU/mL. Performed at [facility], [address] [place], [place] [address]   Urinalysis, Microscopic (reflex)     Status: Abnormal   Collection Time: 12/31/21  9:21 PM  Result Value Ref Range   RBC / HPF 0-5 0 - 5 RBC/hpf   WBC, UA 0-5 0 - 5 WBC/hpf   Bacteria, UA FEW (A) NONE SEEN   Squamous Epithelial / LPF 0-5 0 - 5    Comment: Performed at [facility], [address] [place], [place] [address]    US Abdomen Limited RUQ (LIVER/GB)  Result Date: 12/31/2021 CLINICAL DATA:  Right upper quadrant abdominal pain EXAM: ULTRASOUND ABDOMEN LIMITED RIGHT UPPER QUADRANT COMPARISON:  CT 9:56 p.m. FINDINGS: Gallbladder: The gallbladder is distended there is mild gallbladder wall thickening noted, and mild pericholecystic fluid is identified. No intraluminal stones or sludge is identified. The sonographic Eulah Pont sign is reportedly negative. Common bile duct: Diameter: 5-6 mm Liver: No focal lesion identified. Within normal limits in parenchymal echogenicity. Portal vein is patent on color Doppler imaging with normal direction of blood flow towards the liver. Other: None. IMPRESSION: 1. Distended gallbladder with mild gallbladder wall thickening and pericholecystic fluid. The findings can be seen in the setting of acute acalculous cholecystitis, though additional considerations should include inflammatory conditions of the liver or biliary tree. Correlation with liver enzymes may be helpful. Hepatic biliary scintigraphy may also be helpful to assess patency of the cystic duct. Electronically Signed   By: Helyn Numbers M.D.   On: 12/31/2021 23:28   CT ABDOMEN PELVIS W CONTRAST  Result Date: 12/31/2021 CLINICAL DATA:  Right upper quadrant abdominal pain EXAM: CT ABDOMEN AND PELVIS WITH CONTRAST TECHNIQUE: Multidetector CT imaging of the abdomen and pelvis was performed using the standard protocol following bolus administration of intravenous contrast. RADIATION DOSE REDUCTION:  This exam was performed according to the departmental dose-optimization program which includes automated exposure control, adjustment of the mA and/or kV according to patient size and/or use of iterative reconstruction technique. CONTRAST:  OMNIPAQUE IOHEXOL 300 MG/ML  SOLN COMPARISON:  None Available. FINDINGS: Lower chest: No acute abnormality. Hepatobiliary: No suspicious focal liver abnormality is seen. Gallbladder wall  thickening and intrahepatic biliary ductal dilation. Mild prominence of the common bile duct measuring 6 mm in diameter. No definite obstructing stone. Pancreas: Unremarkable. No pancreatic ductal dilatation or surrounding inflammatory changes. Spleen: Normal in size without focal abnormality. Adrenals/Urinary Tract: Adrenal glands are unremarkable. Kidneys are normal, without renal calculi, suspicious focal lesion, or hydronephrosis. Bladder is unremarkable. Stomach/Bowel: Stomach is within normal limits. No evidence of bowel wall thickening, distention, or inflammatory changes. The appendix is normal. Moderate colonic stool load. Fecalization of the terminal ileum. Vascular/Lymphatic: No significant vascular findings are present. No enlarged abdominal or pelvic lymph nodes. Reproductive: Tubal ligation. Unremarkable appearance of the right ovary and uterus. Mass within the left ovary with fat density compatible with dermoid measuring 3.0 cm. This has increased in size since ultrasound 07/11/2016. Other: Small amount of free fluid in the pelvis, likely physiologic. No free intraperitoneal air. Musculoskeletal: No acute or significant osseous findings. IMPRESSION: Findings suggestive of acute cholecystitis. No definite cholelithiasis. Fat density mass in the right ovary consistent with dermoid cyst, increased since 07/11/2016. Moderate fecal burden with fecalization of the terminal ileum. Recommend correlation for constipation. Electronically Signed   By: Minerva Fester M.D.   On:  12/31/2021 22:19    ROS - all of the below systems have been reviewed with the patient and positives are indicated with bold text General: chills, fever or night sweats Eyes: blurry vision or double vision ENT: epistaxis or sore throat Hematologic/Lymphatic: bleeding problems, blood clots or swollen lymph nodes Endocrine: temperature intolerance or unexpected weight changes Breast: new or changing breast lumps or nipple discharge Resp: cough, shortness of breath, or wheezing CV: chest pain or dyspnea on exertion GI: as per HPI GU: dysuria, trouble voiding, or hematuria Neuro: TIA or stroke symptoms    PE Blood pressure 107/63, pulse 72, temperature 98.9 F (37.2 C), temperature source Oral, resp. rate 18, height 4\' 11"  (1.499 m), weight 58.1 kg, last menstrual period 12/11/2021, SpO2 97 %. Constitutional: NAD; conversant; no deformities Eyes: Moist conjunctiva; no lid lag; anicteric; PERRL Neck: Trachea midline; no thyromegaly Lungs: Normal respiratory effort CV: RRR GI: Abd soft, TTP epigastrium MSK: Normal range of motion of extremities; no clubbing/cyanosis Psychiatric: Appropriate affect; alert and oriented x3  Results for orders placed or performed during the hospital encounter of 12/31/21 (from the past 48 hour(s))  Lipase, blood     Status: None   Collection Time: 12/31/21  8:26 PM  Result Value Ref Range   Lipase 36 11 - 51 U/L    Comment: Performed at [facility], [address] [place], [place]  Comprehensive metabolic panel     Status: Abnormal   Collection Time: 12/31/21  8:26 PM  Result Value Ref Range   Sodium 136 135 - 145 mmol/L   Potassium 3.9 3.5 - 5.1 mmol/L   Chloride 100 98 - 111 mmol/L   CO2 25 22 - 32 mmol/L   Glucose, Bld 104 (H) 70 - 99 mg/dL    Comment: Glucose reference range applies only to samples taken after fasting for at least 8 hours.   BUN 13 6 - 20 mg/dL   Creatinine, Ser 13/09/23 0.44 - 1.00 mg/dL   Calcium 9.0 8.9 -  3.87 mg/dL   Total Protein 7.8 6.5 - 8.1 g/dL   Albumin 4.3 3.5 - 5.0 g/dL   AST 56.4 (H) 15 - 41 U/L   ALT 262 (H) 0 - 44 U/L   Alkaline Phosphatase 99 38 -  126 U/L   Total Bilirubin 1.0 0.3 - 1.2 mg/dL   GFR, Estimated >28 >00 mL/min    Comment: (NOTE) Calculated using the CKD-EPI Creatinine Equation (2021)    Anion gap 11 5 - 15    Comment: Performed at [facility], [address] [place], [place] [address]  CBC     Status: None   Collection Time: 12/31/21  8:26 PM  Result Value Ref Range   WBC 10.3 4.0 - 10.5 K/uL   RBC 4.94 3.87 - 5.11 MIL/uL   Hemoglobin 13.9 12.0 - 15.0 g/dL   HCT 91.5 05.6 - 97.9 %   MCV 82.4 80.0 - 100.0 fL   MCH 28.1 26.0 - 34.0 pg   MCHC 34.2 30.0 - 36.0 g/dL   RDW 48.0 16.5 - 53.7 %   Platelets 275 150 - 400 K/uL   nRBC 0.0 0.0 - 0.2 %    Comment: Performed at [facility], [address] [place], [place] [address]  Urinalysis, Routine w reflex microscopic Urine, Clean Catch     Status: Abnormal   Collection Time: 12/31/21  9:21 PM  Result Value Ref Range   Color, Urine YELLOW YELLOW   APPearance CLEAR CLEAR   Specific Gravity, Urine 1.020 1.005 - 1.030   pH 5.5 5.0 - 8.0   Glucose, UA NEGATIVE NEGATIVE mg/dL   Hgb urine dipstick TRACE (A) NEGATIVE   Bilirubin Urine NEGATIVE NEGATIVE   Ketones, ur NEGATIVE NEGATIVE mg/dL   Protein, ur NEGATIVE NEGATIVE mg/dL   Nitrite NEGATIVE NEGATIVE   Leukocytes,Ua NEGATIVE NEGATIVE    Comment: Performed at [facility], [address] [place], [place] [address]  Pregnancy, urine     Status: None   Collection Time: 12/31/21  9:21 PM  Result Value Ref Range   Preg Test, Ur NEGATIVE NEGATIVE    Comment:        THE SENSITIVITY OF THIS METHODOLOGY IS >20 mIU/mL. Performed at [facility], [address] [place], [place] [address]   Urinalysis, Microscopic (reflex)     Status: Abnormal   Collection Time: 12/31/21  9:21 PM  Result Value Ref Range   RBC /  HPF 0-5 0 - 5 RBC/hpf   WBC, UA 0-5 0 - 5 WBC/hpf   Bacteria, UA FEW (A) NONE SEEN   Squamous Epithelial / LPF 0-5 0 - 5    Comment: Performed at [facility], [address] [place], [place] [address]    US Abdomen Limited RUQ (LIVER/GB)  Result Date: 12/31/2021 CLINICAL DATA:  Right upper quadrant abdominal pain EXAM: ULTRASOUND ABDOMEN LIMITED RIGHT UPPER QUADRANT COMPARISON:  CT 9:56 p.m. FINDINGS: Gallbladder: The gallbladder is distended there is mild gallbladder wall thickening noted, and mild pericholecystic fluid is identified. No intraluminal stones or sludge is identified. The sonographic Eulah Pont sign is reportedly negative. Common bile duct: Diameter: 5-6 mm Liver: No focal lesion identified. Within normal limits in parenchymal echogenicity. Portal vein is patent on color Doppler imaging with normal direction of blood flow towards the liver. Other: None. IMPRESSION: 1. Distended gallbladder with mild gallbladder wall thickening and pericholecystic fluid. The findings can be seen in the setting of acute acalculous cholecystitis, though additional considerations should include inflammatory conditions of the liver or biliary tree. Correlation with liver enzymes may be helpful. Hepatic biliary scintigraphy may also be helpful to assess patency of the cystic duct. Electronically Signed   By:  Helyn NumbersAshesh  Parikh M.D.   On: 12/31/2021 23:28   CT ABDOMEN PELVIS W CONTRAST  Result Date: 12/31/2021 CLINICAL DATA:  Right upper quadrant abdominal pain EXAM: CT ABDOMEN AND PELVIS WITH CONTRAST TECHNIQUE: Multidetector CT imaging of the abdomen and pelvis was performed using the standard protocol following bolus administration of intravenous contrast. RADIATION DOSE REDUCTION: This exam was performed according to the departmental dose-optimization program which includes automated exposure control, adjustment of the mA and/or kV according to patient size and/or use of iterative reconstruction  technique. CONTRAST:  100mL OMNIPAQUE IOHEXOL 300 MG/ML  SOLN COMPARISON:  None Available. FINDINGS: Lower chest: No acute abnormality. Hepatobiliary: No suspicious focal liver abnormality is seen. Gallbladder wall thickening and intrahepatic biliary ductal dilation. Mild prominence of the common bile duct measuring 6 mm in diameter. No definite obstructing stone. Pancreas: Unremarkable. No pancreatic ductal dilatation or surrounding inflammatory changes. Spleen: Normal in size without focal abnormality. Adrenals/Urinary Tract: Adrenal glands are unremarkable. Kidneys are normal, without renal calculi, suspicious focal lesion, or hydronephrosis. Bladder is unremarkable. Stomach/Bowel: Stomach is within normal limits. No evidence of bowel wall thickening, distention, or inflammatory changes. The appendix is normal. Moderate colonic stool load. Fecalization of the terminal ileum. Vascular/Lymphatic: No significant vascular findings are present. No enlarged abdominal or pelvic lymph nodes. Reproductive: Tubal ligation. Unremarkable appearance of the right ovary and uterus. Mass within the left ovary with fat density compatible with dermoid measuring 3.0 cm. This has increased in size since ultrasound 07/11/2016. Other: Small amount of free fluid in the pelvis, likely physiologic. No free intraperitoneal air. Musculoskeletal: No acute or significant osseous findings. IMPRESSION: Findings suggestive of acute cholecystitis. No definite cholelithiasis. Fat density mass in the right ovary consistent with dermoid cyst, increased since 07/11/2016. Moderate fecal burden with fecalization of the terminal ileum. Recommend correlation for constipation. Electronically Signed   By: Minerva Festeryler  Stutzman M.D.   On: 12/31/2021 22:19     A/P: Rhett BannisterMarieliz Velazquez-Ruiz is an 26 y.o. female with what appears to be early acalculous cholecystitis.  Cont NPO and IV abx.  Will discuss with oncoming team.    Vanita PandaAlicia C Cauy Melody, MD  Colorectal  and General Surgery [facility]  Total time of evaluation, examination, counseling and implementing medical decisions was 55 mins.  moderate decision making.

## 2022-01-01 NOTE — H&P (View-Only) (Signed)
Referring Provider: Darliss Cheney, MD Primary Care Physician:  Patient, No Pcp Per Primary Gastroenterologist:  Althia Forts  Reason for Consultation:  Acute cholecystitis s/p cholecystectomy with +IOC  HPI: Jamie Hansen is a 26 y.o. female who presented to the ED 12/31/2021 with complaint of 2 days RUQ abdominal pain.  Was seen at an outside hospital and had a negative RUQ [place], LFTs found to be elevated.  Was treated for gastritis and sent home.  Returned to the ED with worsening pain, nausea, vomiting. Denies diarrhea.  CT and RUQ US showed gallbladder distention and early cholecystitis without clear evidence of cholelithiasis.  She underwent laparoscopic cholecystectomy 01/01/2022, was found to have choledocholithiasis, +IOC.  GI consulted for consideration of ERCP.  Patient seen and examined at bedside in PACU s/p lap chole. Reports nausea and R sided abdominal pain/R shoulder pain. Tells me her sister also has history of gallbladder disease.  Denies prior endoscopic procedures.   Denies MI/stroke history or blood thinner use.   Past Medical History:  Diagnosis Date   Bipolar 1 disorder ([place])    Chlamydia    Complication of anesthesia    low BP, drowsy   Depression    Pregnancy induced hypertension     Past Surgical History:  Procedure Laterality Date   CESAREAN SECTION     HERNIA REPAIR     TUBAL LIGATION Bilateral 02/23/2017   Procedure: POST PARTUM TUBAL LIGATION;  Surgeon: [name], MD;  Location: [facility];  Service: Gynecology;  Laterality: Bilateral;    Prior to Admission medications   Medication Sig Start Date End Date Taking? Authorizing Provider  Multiple Vitamins-Minerals (MULTIVITAMIN WITH MINERALS) tablet Take 1 tablet by mouth daily.   Yes [provider]  omeprazole (PRILOSEC) 40 MG capsule Take 1 capsule by mouth daily. 12/30/21 01/29/22 Yes [provider]  ondansetron (ZOFRAN-ODT) 4 MG disintegrating tablet Take  1 tablet (4 mg total) by mouth every 8 (eight) hours as needed for nausea or vomiting. 08/28/20  Yes Davonna Belling, MD    Scheduled Meds:  Chlorhexidine Gluconate Cloth  6 each Topical Once   [MAR Hold] enoxaparin (LOVENOX) injection  40 mg Subcutaneous Q24H   promethazine       scopolamine  1 patch Transdermal Once   Continuous Infusions:  ceFAZolin     lactated ringers 100 mL/hr at 01/01/22 1303   [MAR Hold] piperacillin-tazobactam (ZOSYN)  IV 3.375 g (01/01/22 0611)   PRN Meds:.0.9 % irrigation (POUR BTL), [MAR Hold] acetaminophen **OR** [MAR Hold] acetaminophen, amisulpride, bupivacaine-EPINEPHrine, ceFAZolin, HYDROmorphone (DILAUDID) injection, lactated ringers, meperidine (DEMEROL) injection, [MAR Hold]  morphine injection, Omnipaque 300 mg/mL (10 ml)in 0.9 % normal saline (10 mL), [MAR Hold] ondansetron **OR** [MAR Hold] ondansetron (ZOFRAN) IV, oxyCODONE **OR** oxyCODONE, promethazine, promethazine  Allergies as of 12/31/2021 - Review Complete 12/31/2021  Allergen Reaction Noted   Latex Itching and Other (See Comments) 08/07/2014    Family History  Problem Relation Age of Onset   Cholecystitis Sister    Cancer Maternal Grandmother     Social History   Socioeconomic History   Marital status: Divorced    Spouse name: Not on file   Number of children: Not on file   Years of education: Not on file   Highest education level: Not on file  Occupational History   Not on file  Tobacco Use   Smoking status: Former   Smokeless tobacco: Never  Vaping Use   Vaping Use: Never used  Substance and Sexual Activity  Alcohol use: Not Currently    Comment: occ   Drug use: No   Sexual activity: Yes    Partners: Female    Birth control/protection: Surgical    Comment: 1ST INTERCOURSE- 14, PARTNERS- 3  Other Topics Concern   Not on file  Social History Narrative   Not on file   Social Determinants of Health   Financial Resource Strain: Not on file  Food Insecurity: No  Food Insecurity (01/01/2022)   Hunger Vital Sign    Worried About Running Out of Food in the Last Year: Never true    Ran Out of Food in the Last Year: Never true  Transportation Needs: No Transportation Needs (01/01/2022)   PRAPARE - Hydrologist (Medical): No    Lack of Transportation (Non-Medical): No  Physical Activity: Not on file  Stress: Not on file  Social Connections: Not on file  Intimate Partner Violence: Not At Risk (01/01/2022)   Humiliation, Afraid, Rape, and Kick questionnaire    Fear of Current or Ex-Partner: No    Emotionally Abused: No    Physically Abused: No    Sexually Abused: No    Review of Systems: see HPI  Physical Exam: Physical Exam Vitals reviewed.  Constitutional:      Appearance: She is normal weight.     Comments: Appears uncomfortable, in pain post-op  HENT:     Head: Normocephalic and atraumatic.     Right Ear: External ear normal.     Left Ear: External ear normal.     Nose: Nose normal.     Mouth/Throat:     Mouth: Mucous membranes are moist.     Pharynx: Oropharynx is clear.  Eyes:     Extraocular Movements: Extraocular movements intact.     Conjunctiva/sclera: Conjunctivae normal.  Cardiovascular:     Rate and Rhythm: Normal rate and regular rhythm.     Pulses: Normal pulses.     Heart sounds: Normal heart sounds.  Pulmonary:     Effort: Pulmonary effort is normal.     Breath sounds: Normal breath sounds.  Abdominal:     Palpations: Abdomen is soft.  Musculoskeletal:     Cervical back: Normal range of motion and neck supple.     Right lower leg: No edema.     Left lower leg: No edema.  Skin:    General: Skin is warm and dry.  Neurological:     General: No focal deficit present.     Mental Status: She is oriented to person, place, and time.  Psychiatric:        Behavior: Behavior normal.        Thought Content: Thought content normal.        Vital signs: Vitals:   01/01/22 1430 01/01/22  1445  BP: 127/87 131/86  Pulse: 71 77  Resp: 16 14  Temp: 98.7 F (37.1 C)   SpO2: 100% 100%   Last BM Date : 12/31/21    GI:  Lab Results: Recent Labs    12/31/21 2026 01/01/22 0544  WBC 10.3 8.4  HGB 13.9 12.1  HCT 40.7 34.5*  PLT 275 213   BMET Recent Labs    12/31/21 2026 01/01/22 0544  NA 136 138  K 3.9 3.5  CL 100 109  CO2 25 25  GLUCOSE 104* 99  BUN 13 10  CREATININE 0.75 0.71  CALCIUM 9.0 8.0*   LFT Recent Labs    01/01/22 0544  PROT  6.1*  ALBUMIN 3.3*  AST 79*  ALT 205*  ALKPHOS 84  BILITOT 1.1   PT/INR No results for input(s): "LABPROT", "INR" in the last 72 hours.   Studies/Results: US Abdomen Limited RUQ (LIVER/GB)  Result Date: 12/31/2021 CLINICAL DATA:  Right upper quadrant abdominal pain EXAM: ULTRASOUND ABDOMEN LIMITED RIGHT UPPER QUADRANT COMPARISON:  CT 9:56 p.m. FINDINGS: Gallbladder: The gallbladder is distended there is mild gallbladder wall thickening noted, and mild pericholecystic fluid is identified. No intraluminal stones or sludge is identified. The sonographic Percell Miller sign is reportedly negative. Common bile duct: Diameter: 5-6 mm Liver: No focal lesion identified. Within normal limits in parenchymal echogenicity. Portal vein is patent on color Doppler imaging with normal direction of blood flow towards the liver. Other: None. IMPRESSION: 1. Distended gallbladder with mild gallbladder wall thickening and pericholecystic fluid. The findings can be seen in the setting of acute acalculous cholecystitis, though additional considerations should include inflammatory conditions of the liver or biliary tree. Correlation with liver enzymes may be helpful. Hepatic biliary scintigraphy may also be helpful to assess patency of the cystic duct. Electronically Signed   By: Fidela Salisbury M.D.   On: 12/31/2021 23:28   CT ABDOMEN PELVIS W CONTRAST  Result Date: 12/31/2021 CLINICAL DATA:  Right upper quadrant abdominal pain EXAM: CT ABDOMEN AND  PELVIS WITH CONTRAST TECHNIQUE: Multidetector CT imaging of the abdomen and pelvis was performed using the standard protocol following bolus administration of intravenous contrast. RADIATION DOSE REDUCTION: This exam was performed according to the departmental dose-optimization program which includes automated exposure control, adjustment of the mA and/or kV according to patient size and/or use of iterative reconstruction technique. CONTRAST:  163m OMNIPAQUE IOHEXOL 300 MG/ML  SOLN COMPARISON:  None Available. FINDINGS: Lower chest: No acute abnormality. Hepatobiliary: No suspicious focal liver abnormality is seen. Gallbladder wall thickening and intrahepatic biliary ductal dilation. Mild prominence of the common bile duct measuring 6 mm in diameter. No definite obstructing stone. Pancreas: Unremarkable. No pancreatic ductal dilatation or surrounding inflammatory changes. Spleen: Normal in size without focal abnormality. Adrenals/Urinary Tract: Adrenal glands are unremarkable. Kidneys are normal, without renal calculi, suspicious focal lesion, or hydronephrosis. Bladder is unremarkable. Stomach/Bowel: Stomach is within normal limits. No evidence of bowel wall thickening, distention, or inflammatory changes. The appendix is normal. Moderate colonic stool load. Fecalization of the terminal ileum. Vascular/Lymphatic: No significant vascular findings are present. No enlarged abdominal or pelvic lymph nodes. Reproductive: Tubal ligation. Unremarkable appearance of the right ovary and uterus. Mass within the left ovary with fat density compatible with dermoid measuring 3.0 cm. This has increased in size since ultrasound 07/11/2016. Other: Small amount of free fluid in the pelvis, likely physiologic. No free intraperitoneal air. Musculoskeletal: No acute or significant osseous findings. IMPRESSION: Findings suggestive of acute cholecystitis. No definite cholelithiasis. Fat density mass in the right ovary consistent with  dermoid cyst, increased since 07/11/2016. Moderate fecal burden with fecalization of the terminal ileum. Recommend correlation for constipation. Electronically Signed   By: TPlacido SouM.D.   On: 12/31/2021 22:19    Impression: Acute cholecystitis, choledocholithiasis - Cholecystitis seen on CT and RUQ [place]- Labs on presentation significant for AST 102, ALT 262, alk phos 99, T. bili 1.0.  LFTs slightly improved today. No leukocytosis or anemia, normal platelet count. - S/p laparoscopic cholecystectomy with +IOC  Plan: Choledocholithiasis seen on IOC. Will discuss with endo unit and weekend team regarding timing of ERCP. Continue supportive care, pain control, anti-emetics. NPO at midnight for possible  procedure. GI will follow.    LOS: 0 days   Angelique Holm  PA-C 01/01/2022, 2:52 PM  Contact #  [phone]

## 2022-01-01 NOTE — Progress Notes (Signed)
26 year old lady with no past medical history was admitted with decubitus cholecystitis.  Patient seen and examined.  Still complains of abdominal pain.  No other complaint.  On exam, she is tender at epigastrium but more at right upper quadrant.  General surgery is following.  MRCP is ordered.  Defer further management to general surgery.  Continue antibiotics in the meantime.

## 2022-01-01 NOTE — Consult Note (Signed)
Referring Provider: Darliss Cheney, MD Primary Care Physician:  Patient, No Pcp Per Primary Gastroenterologist:  Althia Forts  Reason for Consultation:  Acute cholecystitis s/p cholecystectomy with +IOC  HPI: Jamie Hansen is a 26 y.o. female who presented to the ED 12/31/2021 with complaint of 2 days RUQ abdominal pain.  Was seen at an outside hospital and had a negative RUQ [place], LFTs found to be elevated.  Was treated for gastritis and sent home.  Returned to the ED with worsening pain, nausea, vomiting. Denies diarrhea.  CT and RUQ US showed gallbladder distention and early cholecystitis without clear evidence of cholelithiasis.  She underwent laparoscopic cholecystectomy 01/01/2022, was found to have choledocholithiasis, +IOC.  GI consulted for consideration of ERCP.  Patient seen and examined at bedside in PACU s/p lap chole. Reports nausea and R sided abdominal pain/R shoulder pain. Tells me her sister also has history of gallbladder disease.  Denies prior endoscopic procedures.   Denies MI/stroke history or blood thinner use.   Past Medical History:  Diagnosis Date   Bipolar 1 disorder ([place])    Chlamydia    Complication of anesthesia    low BP, drowsy   Depression    Pregnancy induced hypertension     Past Surgical History:  Procedure Laterality Date   CESAREAN SECTION     HERNIA REPAIR     TUBAL LIGATION Bilateral 02/23/2017   Procedure: POST PARTUM TUBAL LIGATION;  Surgeon: [name], MD;  Location: [facility];  Service: Gynecology;  Laterality: Bilateral;    Prior to Admission medications   Medication Sig Start Date End Date Taking? Authorizing Provider  Multiple Vitamins-Minerals (MULTIVITAMIN WITH MINERALS) tablet Take 1 tablet by mouth daily.   Yes [provider]  omeprazole (PRILOSEC) 40 MG capsule Take 1 capsule by mouth daily. 12/30/21 01/29/22 Yes [provider]  ondansetron (ZOFRAN-ODT) 4 MG disintegrating tablet Take  1 tablet (4 mg total) by mouth every 8 (eight) hours as needed for nausea or vomiting. 08/28/20  Yes Davonna Belling, MD    Scheduled Meds:  Chlorhexidine Gluconate Cloth  6 each Topical Once   [MAR Hold] enoxaparin (LOVENOX) injection  40 mg Subcutaneous Q24H   promethazine       scopolamine  1 patch Transdermal Once   Continuous Infusions:  ceFAZolin     lactated ringers 100 mL/hr at 01/01/22 1303   [MAR Hold] piperacillin-tazobactam (ZOSYN)  IV 3.375 g (01/01/22 0611)   PRN Meds:.0.9 % irrigation (POUR BTL), [MAR Hold] acetaminophen **OR** [MAR Hold] acetaminophen, amisulpride, bupivacaine-EPINEPHrine, ceFAZolin, HYDROmorphone (DILAUDID) injection, lactated ringers, meperidine (DEMEROL) injection, [MAR Hold]  morphine injection, Omnipaque 300 mg/mL (10 ml)in 0.9 % normal saline (10 mL), [MAR Hold] ondansetron **OR** [MAR Hold] ondansetron (ZOFRAN) IV, oxyCODONE **OR** oxyCODONE, promethazine, promethazine  Allergies as of 12/31/2021 - Review Complete 12/31/2021  Allergen Reaction Noted   Latex Itching and Other (See Comments) 08/07/2014    Family History  Problem Relation Age of Onset   Cholecystitis Sister    Cancer Maternal Grandmother     Social History   Socioeconomic History   Marital status: Divorced    Spouse name: Not on file   Number of children: Not on file   Years of education: Not on file   Highest education level: Not on file  Occupational History   Not on file  Tobacco Use   Smoking status: Former   Smokeless tobacco: Never  Vaping Use   Vaping Use: Never used  Substance and Sexual Activity  Alcohol use: Not Currently    Comment: occ   Drug use: No   Sexual activity: Yes    Partners: Female    Birth control/protection: Surgical    Comment: 1ST INTERCOURSE- 14, PARTNERS- 3  Other Topics Concern   Not on file  Social History Narrative   Not on file   Social Determinants of Health   Financial Resource Strain: Not on file  Food Insecurity: No  Food Insecurity (01/01/2022)   Hunger Vital Sign    Worried About Running Out of Food in the Last Year: Never true    Ran Out of Food in the Last Year: Never true  Transportation Needs: No Transportation Needs (01/01/2022)   PRAPARE - Hydrologist (Medical): No    Lack of Transportation (Non-Medical): No  Physical Activity: Not on file  Stress: Not on file  Social Connections: Not on file  Intimate Partner Violence: Not At Risk (01/01/2022)   Humiliation, Afraid, Rape, and Kick questionnaire    Fear of Current or Ex-Partner: No    Emotionally Abused: No    Physically Abused: No    Sexually Abused: No    Review of Systems: see HPI  Physical Exam: Physical Exam Vitals reviewed.  Constitutional:      Appearance: She is normal weight.     Comments: Appears uncomfortable, in pain post-op  HENT:     Head: Normocephalic and atraumatic.     Right Ear: External ear normal.     Left Ear: External ear normal.     Nose: Nose normal.     Mouth/Throat:     Mouth: Mucous membranes are moist.     Pharynx: Oropharynx is clear.  Eyes:     Extraocular Movements: Extraocular movements intact.     Conjunctiva/sclera: Conjunctivae normal.  Cardiovascular:     Rate and Rhythm: Normal rate and regular rhythm.     Pulses: Normal pulses.     Heart sounds: Normal heart sounds.  Pulmonary:     Effort: Pulmonary effort is normal.     Breath sounds: Normal breath sounds.  Abdominal:     Palpations: Abdomen is soft.  Musculoskeletal:     Cervical back: Normal range of motion and neck supple.     Right lower leg: No edema.     Left lower leg: No edema.  Skin:    General: Skin is warm and dry.  Neurological:     General: No focal deficit present.     Mental Status: She is oriented to person, place, and time.  Psychiatric:        Behavior: Behavior normal.        Thought Content: Thought content normal.        Vital signs: Vitals:   01/01/22 1430 01/01/22  1445  BP: 127/87 131/86  Pulse: 71 77  Resp: 16 14  Temp: 98.7 F (37.1 C)   SpO2: 100% 100%   Last BM Date : 12/31/21    GI:  Lab Results: Recent Labs    12/31/21 2026 01/01/22 0544  WBC 10.3 8.4  HGB 13.9 12.1  HCT 40.7 34.5*  PLT 275 213   BMET Recent Labs    12/31/21 2026 01/01/22 0544  NA 136 138  K 3.9 3.5  CL 100 109  CO2 25 25  GLUCOSE 104* 99  BUN 13 10  CREATININE 0.75 0.71  CALCIUM 9.0 8.0*   LFT Recent Labs    01/01/22 0544  PROT  6.1*  ALBUMIN 3.3*  AST 79*  ALT 205*  ALKPHOS 84  BILITOT 1.1   PT/INR No results for input(s): "LABPROT", "INR" in the last 72 hours.   Studies/Results: US Abdomen Limited RUQ (LIVER/GB)  Result Date: 12/31/2021 CLINICAL DATA:  Right upper quadrant abdominal pain EXAM: ULTRASOUND ABDOMEN LIMITED RIGHT UPPER QUADRANT COMPARISON:  CT 9:56 p.m. FINDINGS: Gallbladder: The gallbladder is distended there is mild gallbladder wall thickening noted, and mild pericholecystic fluid is identified. No intraluminal stones or sludge is identified. The sonographic Percell Miller sign is reportedly negative. Common bile duct: Diameter: 5-6 mm Liver: No focal lesion identified. Within normal limits in parenchymal echogenicity. Portal vein is patent on color Doppler imaging with normal direction of blood flow towards the liver. Other: None. IMPRESSION: 1. Distended gallbladder with mild gallbladder wall thickening and pericholecystic fluid. The findings can be seen in the setting of acute acalculous cholecystitis, though additional considerations should include inflammatory conditions of the liver or biliary tree. Correlation with liver enzymes may be helpful. Hepatic biliary scintigraphy may also be helpful to assess patency of the cystic duct. Electronically Signed   By: Fidela Salisbury M.D.   On: 12/31/2021 23:28   CT ABDOMEN PELVIS W CONTRAST  Result Date: 12/31/2021 CLINICAL DATA:  Right upper quadrant abdominal pain EXAM: CT ABDOMEN AND  PELVIS WITH CONTRAST TECHNIQUE: Multidetector CT imaging of the abdomen and pelvis was performed using the standard protocol following bolus administration of intravenous contrast. RADIATION DOSE REDUCTION: This exam was performed according to the departmental dose-optimization program which includes automated exposure control, adjustment of the mA and/or kV according to patient size and/or use of iterative reconstruction technique. CONTRAST:  129m OMNIPAQUE IOHEXOL 300 MG/ML  SOLN COMPARISON:  None Available. FINDINGS: Lower chest: No acute abnormality. Hepatobiliary: No suspicious focal liver abnormality is seen. Gallbladder wall thickening and intrahepatic biliary ductal dilation. Mild prominence of the common bile duct measuring 6 mm in diameter. No definite obstructing stone. Pancreas: Unremarkable. No pancreatic ductal dilatation or surrounding inflammatory changes. Spleen: Normal in size without focal abnormality. Adrenals/Urinary Tract: Adrenal glands are unremarkable. Kidneys are normal, without renal calculi, suspicious focal lesion, or hydronephrosis. Bladder is unremarkable. Stomach/Bowel: Stomach is within normal limits. No evidence of bowel wall thickening, distention, or inflammatory changes. The appendix is normal. Moderate colonic stool load. Fecalization of the terminal ileum. Vascular/Lymphatic: No significant vascular findings are present. No enlarged abdominal or pelvic lymph nodes. Reproductive: Tubal ligation. Unremarkable appearance of the right ovary and uterus. Mass within the left ovary with fat density compatible with dermoid measuring 3.0 cm. This has increased in size since ultrasound 07/11/2016. Other: Small amount of free fluid in the pelvis, likely physiologic. No free intraperitoneal air. Musculoskeletal: No acute or significant osseous findings. IMPRESSION: Findings suggestive of acute cholecystitis. No definite cholelithiasis. Fat density mass in the right ovary consistent with  dermoid cyst, increased since 07/11/2016. Moderate fecal burden with fecalization of the terminal ileum. Recommend correlation for constipation. Electronically Signed   By: TPlacido SouM.D.   On: 12/31/2021 22:19    Impression: Acute cholecystitis, choledocholithiasis - Cholecystitis seen on CT and RUQ [place]- Labs on presentation significant for AST 102, ALT 262, alk phos 99, T. bili 1.0.  LFTs slightly improved today. No leukocytosis or anemia, normal platelet count. - S/p laparoscopic cholecystectomy with +IOC  Plan: Choledocholithiasis seen on IOC. Will discuss with endo unit and weekend team regarding timing of ERCP. Continue supportive care, pain control, anti-emetics. NPO at midnight for possible  procedure. GI will follow.    LOS: 0 days   Angelique Holm  PA-C 01/01/2022, 2:52 PM  Contact #  [phone]

## 2022-01-02 ENCOUNTER — Observation Stay (HOSPITAL_COMMUNITY): Payer: BC Managed Care – PPO | Admitting: Anesthesiology

## 2022-01-02 ENCOUNTER — Encounter (HOSPITAL_COMMUNITY)
Admission: EM | Disposition: A | Discharge status: Home / Self Care | Payer: Self-pay | Source: Home / Self Care | Attending: Family Medicine

## 2022-01-02 ENCOUNTER — Encounter (HOSPITAL_COMMUNITY): Payer: Self-pay | Admitting: Surgery

## 2022-01-02 ENCOUNTER — Observation Stay (HOSPITAL_COMMUNITY): Payer: BC Managed Care – PPO

## 2022-01-02 DIAGNOSIS — Z91040 Latex allergy status: Secondary | ICD-10-CM | POA: Diagnosis not present

## 2022-01-02 DIAGNOSIS — K819 Cholecystitis, unspecified: Secondary | ICD-10-CM | POA: Diagnosis present

## 2022-01-02 DIAGNOSIS — K810 Acute cholecystitis: Secondary | ICD-10-CM | POA: Diagnosis present

## 2022-01-02 DIAGNOSIS — Z87891 Personal history of nicotine dependence: Secondary | ICD-10-CM | POA: Diagnosis not present

## 2022-01-02 DIAGNOSIS — Y848 Other medical procedures as the cause of abnormal reaction of the patient, or of later complication, without mention of misadventure at the time of the procedure: Secondary | ICD-10-CM | POA: Diagnosis not present

## 2022-01-02 DIAGNOSIS — K8042 Calculus of bile duct with acute cholecystitis without obstruction: Secondary | ICD-10-CM | POA: Diagnosis present

## 2022-01-02 DIAGNOSIS — K8050 Calculus of bile duct without cholangitis or cholecystitis without obstruction: Secondary | ICD-10-CM | POA: Diagnosis not present

## 2022-01-02 DIAGNOSIS — Z79899 Other long term (current) drug therapy: Secondary | ICD-10-CM | POA: Diagnosis not present

## 2022-01-02 DIAGNOSIS — F319 Bipolar disorder, unspecified: Secondary | ICD-10-CM | POA: Diagnosis present

## 2022-01-02 DIAGNOSIS — K859 Acute pancreatitis without necrosis or infection, unspecified: Secondary | ICD-10-CM | POA: Diagnosis not present

## 2022-01-02 HISTORY — PX: REMOVAL OF STONES: SHX5545

## 2022-01-02 HISTORY — PX: SPHINCTEROTOMY: SHX5544

## 2022-01-02 HISTORY — PX: ERCP: SHX5425

## 2022-01-02 LAB — CBC WITH DIFFERENTIAL/PLATELET
Abs Immature Granulocytes: 0.05 10*3/uL (ref 0.00–0.07)
Basophils Absolute: 0 10*3/uL (ref 0.0–0.1)
Basophils Relative: 0 %
Eosinophils Absolute: 0 10*3/uL (ref 0.0–0.5)
Eosinophils Relative: 0 %
HCT: 35.9 % — ABNORMAL LOW (ref 36.0–46.0)
Hemoglobin: 12.2 g/dL (ref 12.0–15.0)
Immature Granulocytes: 1 %
Lymphocytes Relative: 14 %
Lymphs Abs: 1.2 10*3/uL (ref 0.7–4.0)
MCH: 28.8 pg (ref 26.0–34.0)
MCHC: 34 g/dL (ref 30.0–36.0)
MCV: 84.7 fL (ref 80.0–100.0)
Monocytes Absolute: 0.4 10*3/uL (ref 0.1–1.0)
Monocytes Relative: 4 %
Neutro Abs: 7.1 10*3/uL (ref 1.7–7.7)
Neutrophils Relative %: 81 %
Platelets: 206 10*3/uL (ref 150–400)
RBC: 4.24 MIL/uL (ref 3.87–5.11)
RDW: 13.8 % (ref 11.5–15.5)
WBC: 8.7 10*3/uL (ref 4.0–10.5)
nRBC: 0 % (ref 0.0–0.2)

## 2022-01-02 LAB — COMPREHENSIVE METABOLIC PANEL
ALT: 371 U/L — ABNORMAL HIGH (ref 0–44)
AST: 266 U/L — ABNORMAL HIGH (ref 15–41)
Albumin: 3.7 g/dL (ref 3.5–5.0)
Alkaline Phosphatase: 93 U/L (ref 38–126)
Anion gap: 6 (ref 5–15)
BUN: 5 mg/dL — ABNORMAL LOW (ref 6–20)
CO2: 25 mmol/L (ref 22–32)
Calcium: 8.8 mg/dL — ABNORMAL LOW (ref 8.9–10.3)
Chloride: 106 mmol/L (ref 98–111)
Creatinine, Ser: 0.66 mg/dL (ref 0.44–1.00)
GFR, Estimated: >60 mL/min (ref >60)
Glucose, Bld: 109 mg/dL — ABNORMAL HIGH (ref 70–99)
Potassium: 3.8 mmol/L (ref 3.5–5.1)
Sodium: 137 mmol/L (ref 135–145)
Total Bilirubin: 1.3 mg/dL — ABNORMAL HIGH (ref 0.3–1.2)
Total Protein: 7 g/dL (ref 6.5–8.1)

## 2022-01-02 LAB — LIPASE, BLOOD: Lipase: 25 U/L (ref 11–51)

## 2022-01-02 SURGERY — ERCP, WITH INTERVENTION IF INDICATED
Anesthesia: General

## 2022-01-02 MED ORDER — PROPOFOL 10 MG/ML IV BOLUS
INTRAVENOUS | Status: DC | PRN
  Administered 2022-01-02: 200 mg via INTRAVENOUS

## 2022-01-02 MED ORDER — DICLOFENAC SUPPOSITORY 100 MG
RECTAL | Status: AC
  Filled 2022-01-02: qty 1

## 2022-01-02 MED ORDER — INDOMETHACIN 50 MG RE SUPP
RECTAL | Status: AC
  Filled 2022-01-02: qty 2

## 2022-01-02 MED ORDER — GLUCAGON HCL RDNA (DIAGNOSTIC) 1 MG IJ SOLR
INTRAMUSCULAR | Status: AC
  Filled 2022-01-02: qty 2

## 2022-01-02 MED ORDER — ALUM & MAG HYDROXIDE-SIMETH 200-200-20 MG/5ML PO SUSP
30.0000 mL | ORAL | Status: DC | PRN
  Administered 2022-01-02: 30 mL via ORAL
  Filled 2022-01-02: qty 30

## 2022-01-02 MED ORDER — SODIUM CHLORIDE 0.9 % IV SOLN: INTRAVENOUS | Status: DC

## 2022-01-02 MED ORDER — GLUCAGON HCL RDNA (DIAGNOSTIC) 1 MG IJ SOLR
INTRAMUSCULAR | Status: DC | PRN
  Administered 2022-01-02 (×2): .25 mg via INTRAVENOUS

## 2022-01-02 MED ORDER — MIDAZOLAM HCL 2 MG/2ML IJ SOLN
INTRAMUSCULAR | Status: AC
  Filled 2022-01-02: qty 2

## 2022-01-02 MED ORDER — FENTANYL CITRATE (PF) 100 MCG/2ML IJ SOLN
INTRAMUSCULAR | Status: AC
  Filled 2022-01-02: qty 2

## 2022-01-02 MED ORDER — SODIUM CHLORIDE 0.9 % IV SOLN
INTRAVENOUS | Status: DC | PRN
  Administered 2022-01-02: 30 mL

## 2022-01-02 MED ORDER — ONDANSETRON HCL 4 MG/2ML IJ SOLN
INTRAMUSCULAR | Status: DC | PRN
  Administered 2022-01-02: 4 mg via INTRAVENOUS

## 2022-01-02 MED ORDER — LACTATED RINGERS IV SOLN
INTRAVENOUS | Status: AC | PRN
  Administered 2022-01-02: 20 mL/h via INTRAVENOUS

## 2022-01-02 MED ORDER — CIPROFLOXACIN IN D5W 400 MG/200ML IV SOLN
INTRAVENOUS | Status: DC | PRN
  Administered 2022-01-02: 400 mg via INTRAVENOUS

## 2022-01-02 MED ORDER — MIDAZOLAM HCL 5 MG/5ML IJ SOLN
INTRAMUSCULAR | Status: DC | PRN
  Administered 2022-01-02: 2 mg via INTRAVENOUS

## 2022-01-02 MED ORDER — DEXAMETHASONE SODIUM PHOSPHATE 10 MG/ML IJ SOLN
INTRAMUSCULAR | Status: DC | PRN
  Administered 2022-01-02: 10 mg via INTRAVENOUS

## 2022-01-02 MED ORDER — INDOMETHACIN 50 MG RE SUPP
RECTAL | Status: DC | PRN
  Administered 2022-01-02: 100 mg via RECTAL

## 2022-01-02 MED ORDER — CIPROFLOXACIN IN D5W 400 MG/200ML IV SOLN
INTRAVENOUS | Status: AC
  Filled 2022-01-02: qty 200

## 2022-01-02 MED ORDER — SUCCINYLCHOLINE CHLORIDE 200 MG/10ML IV SOSY
PREFILLED_SYRINGE | INTRAVENOUS | Status: DC | PRN
  Administered 2022-01-02: 80 mg via INTRAVENOUS

## 2022-01-02 MED ORDER — LIDOCAINE 2% (20 MG/ML) 5 ML SYRINGE
INTRAMUSCULAR | Status: DC | PRN
  Administered 2022-01-02: 100 mg via INTRAVENOUS

## 2022-01-02 MED ORDER — FENTANYL CITRATE (PF) 250 MCG/5ML IJ SOLN
INTRAMUSCULAR | Status: DC | PRN
  Administered 2022-01-02: 100 ug via INTRAVENOUS

## 2022-01-02 NOTE — Op Note (Signed)
[facility] Patient Name: Jamie Hansen Procedure Date: 01/02/2022 MRN: [id number] Attending MD: Lynann Bologna , MD, [id number] Date of Birth: 20-Jul-1995 CSN: [id number] Age: 26 Admit Type: Inpatient Procedure:                ERCP Indications:              Bile duct stone(s) on IOC Providers:                Lynann Bologna, MD, Fransisca Connors, Priscella Mann,                            Technician Referring MD:              Medicines:                General Anesthesia, Cipro 400 IV, glucagon 0.5 mg,                            Indocin suppositories Complications:            No immediate complications. Estimated Blood Loss:     Estimated blood loss: none. Procedure:                Pre-Anesthesia Assessment:                           - Prior to the procedure, a History and Physical                            was performed, and patient medications and                            allergies were reviewed. The patient's tolerance of                            previous anesthesia was also reviewed. The risks                            and benefits of the procedure and the sedation                            options and risks were discussed with the patient.                            All questions were answered, and informed consent                            was obtained. Prior Anticoagulants: The patient has                            taken no anticoagulant or antiplatelet agents. ASA                            Grade Assessment: II - A patient with mild systemic  disease. After reviewing the risks and benefits,                            the patient was deemed in satisfactory condition to                            undergo the procedure.                           After obtaining informed consent, the scope was                            passed under direct vision. Throughout the                            procedure, the patient's blood  pressure, pulse, and                            oxygen saturations were monitored continuously. The                            [id number]-Q190V ([id number] Olympus duodenoscope was                            introduced through the mouth, and used to inject                            contrast into and used to inject contrast into the                            bile duct. The ERCP was accomplished without                            difficulty. The patient tolerated the procedure                            well. Scope In: Scope Out: Findings:      A scout film of the abdomen was obtained. Evidence of previous       cholecystectomy, were seen in the area of the right upper quadrant of       the abdomen. There was significant gas. The esophagus was successfully       intubated under direct vision. The scope was advanced to a normal major       papilla in the descending duodenum without detailed examination of the       pharynx, larynx and associated structures, and upper GI tract. The upper       GI tract was grossly normal. The major papula was normal. The bile duct       was deeply cannulated with the short-nosed traction sphincterotome on       first attempt. Contrast was injected. I personally interpreted the bile       duct images. There was brisk flow of contrast through the ducts. Image       quality was adequate.      A 8 mm filling defect was noted in  the mid CBD consistent with CBD       stones. The CBD was dilated at 1 cm. The right and the left hepatic       ducts were mildly dilated. 10 mm biliary sphincterotomy was performed at       12 o'clock position tailored to the stone. The stone was extracted using       a 12 mm balloon. The stone was yellow and soft. Multiple sweeps were       performed. There were no residual stones on postocclusion cholangiogram.      Pancreatic duct was intentionally not cannulated or injected. Indocin       suppositories were given  preoperatively. Impression:               - Choledocholithiasis s/p biliary sphincterotomy                            and stone extraction.                           - Previous cholecystectomy. Moderate Sedation:      Not Applicable - Patient had care per Anesthesia. Recommendation:           - Return patient to hospital ward for ongoing care.                           - Watch for pancreatitis, bleeding, perforation,                            and cholangitis.                           - Clear liquid diet. Also watch for ileus.                           - The findings and recommendations were discussed                            with the patient's family and Dr Bosie Clos. Procedure Code(s):        --- Professional ---                           [phone], Endoscopic retrograde                            cholangiopancreatography (ERCP); with                            sphincterotomy/papillotomy                           [phone], Endoscopic catheterization of the biliary                            ductal system, radiological supervision and                            interpretation Diagnosis Code(s):        --- Professional ---  K80.50, Calculus of bile duct without cholangitis                            or cholecystitis without obstruction CPT copyright 2022 American Medical Association. All rights reserved. The codes documented in this report are preliminary and upon coder review may  be revised to meet current compliance requirements. Lynann Bolognaajesh Shanessa Hodak, MD 01/02/2022 10:38:52 AM This report has been signed electronically. Number of Addenda: 0

## 2022-01-02 NOTE — Anesthesia Preprocedure Evaluation (Addendum)
Anesthesia Evaluation  Patient identified by MRN, date of birth, ID band Patient awake    Reviewed: Allergy & Precautions, NPO status , Patient's Chart, lab work & pertinent test results  History of Anesthesia Complications Negative for: history of anesthetic complications  Airway Mallampati: II  TM Distance: >3 FB Neck ROM: Full    Dental  (+) Teeth Intact, Dental Advisory Given   Pulmonary former smoker   Pulmonary exam normal        Cardiovascular hypertension, Normal cardiovascular exam     Neuro/Psych   Anxiety Depression Bipolar Disorder   negative neurological ROS     GI/Hepatic Neg liver ROS,,,Cholecystitis with choledocholithiasis, s/p cholecystectomy   Endo/Other  negative endocrine ROS    Renal/GU negative Renal ROS  negative genitourinary   Musculoskeletal negative musculoskeletal ROS (+)    Abdominal   Peds  Hematology negative hematology ROS (+)   Anesthesia Other Findings   Reproductive/Obstetrics negative OB ROS                             Anesthesia Physical Anesthesia Plan  ASA: 2  Anesthesia Plan: General   Post-op Pain Management: Minimal or no pain anticipated   Induction: Intravenous  PONV Risk Score and Plan: 3 and Ondansetron, Dexamethasone, Treatment may vary due to age or medical condition and Midazolam  Airway Management Planned: Oral ETT  Additional Equipment: None  Intra-op Plan:   Post-operative Plan: Extubation in OR  Informed Consent: I have reviewed the patients History and Physical, chart, labs and discussed the procedure including the risks, benefits and alternatives for the proposed anesthesia with the patient or authorized representative who has indicated his/her understanding and acceptance.     Dental advisory given  Plan Discussed with:   Anesthesia Plan Comments:        Anesthesia Quick Evaluation

## 2022-01-02 NOTE — Progress Notes (Signed)
ERCP this am Will follow up at a later time  Ok to advance diet from surgical perspective

## 2022-01-02 NOTE — Transfer of Care (Signed)
Immediate Anesthesia Transfer of Care Note  Patient: Jamie Hansen  Procedure(s) Performed: ENDOSCOPIC RETROGRADE CHOLANGIOPANCREATOGRAPHY (ERCP) SPHINCTEROTOMY REMOVAL OF STONES  Patient Location: PACU  Anesthesia Type:General  Level of Consciousness: sedated  Airway & Oxygen Therapy: Patient Spontanous Breathing and Patient connected to face mask oxygen  Post-op Assessment: Report given to RN and Post -op Vital signs reviewed and stable  Post vital signs: Reviewed and stable  Last Vitals:  Vitals Value Taken Time  BP    Temp    Pulse 94 01/02/22 1023  Resp    SpO2 99 % 01/02/22 1023  Vitals shown include unvalidated device data.  Last Pain:  Vitals:   01/02/22 0834  TempSrc:   PainSc: 6       Patients Stated Pain Goal: 0 (01/02/22 0834)  Complications: No notable events documented.

## 2022-01-02 NOTE — Interval H&P Note (Signed)
History and Physical Interval Note:  01/02/2022 9:05 AM  Jamie Hansen  has presented today for surgery, with the diagnosis of Choledocholithiasis.  The various methods of treatment have been discussed with the patient and family. After consideration of risks, benefits and other options for treatment, the patient has consented to  Procedure(s): ENDOSCOPIC RETROGRADE CHOLANGIOPANCREATOGRAPHY (ERCP) (N/A) as a surgical intervention.  The patient's history has been reviewed, patient examined, no change in status, stable for surgery.  I have reviewed the patient's chart and labs.  Questions were answered to the patient's satisfaction.     Lynann Bologna

## 2022-01-02 NOTE — Anesthesia Postprocedure Evaluation (Signed)
Anesthesia Post Note  Patient: Jamie Hansen  Procedure(s) Performed: ENDOSCOPIC RETROGRADE CHOLANGIOPANCREATOGRAPHY (ERCP) SPHINCTEROTOMY REMOVAL OF STONES     Patient location during evaluation: PACU Anesthesia Type: General Level of consciousness: awake and alert Pain management: pain level controlled Vital Signs Assessment: post-procedure vital signs reviewed and stable Respiratory status: spontaneous breathing, nonlabored ventilation and respiratory function stable Cardiovascular status: blood pressure returned to baseline and stable Postop Assessment: no apparent nausea or vomiting Anesthetic complications: no   No notable events documented.  Last Vitals:  Vitals:   01/02/22 1030 01/02/22 1040  BP: 107/65 126/83  Pulse: (!) 108 93  Resp: 17 11  Temp:    SpO2: 98% 99%    Last Pain:  Vitals:   01/02/22 1040  TempSrc:   PainSc: 7                  Sydni Elizarraraz E Ashawn Rinehart

## 2022-01-02 NOTE — Progress Notes (Signed)
PROGRESS NOTE    Jamie Hansen  ULA:[id number] DOB: 1995-08-27 DOA: 12/31/2021 PCP: Patient, No Pcp Per   Brief Narrative:  Jamie Hansen is a 26 y.o. female with no past medical history presented with right upper quadrant, pain for 48 hours, she was initially seen at outside hospital and had ultrasound that was discharged home today.  Gastritis, she return to [facility] this time she was diagnosed with acute acalculus cholecystitis and admitted to hospital service, general surgery was consulted.  She underwent laparoscopic cholecystectomy with intraoperative cholangiogram showed distal CBD, GI was consulted and she underwent ERCP.  Assessment & Plan:   Principal Problem:   Acalculous cholecystitis Active Problems:   History of laparoscopic cholecystectomy   Choledocholithiasis with acute cholecystitis  Acute acalculous cholecystitis/CBD stone: S/p laparoscopic cholecystectomy by general surgery on 07/03/2019, was found to have distal CBD stone on intraoperative cholangiogram, GI was consulted, she underwent ERCP today.  Patient just returned from [facility].  Complains of abdominal bloating and pain.  No nausea.  Has some cough.  No other complaint.  Continue current pain management.  Continue clear liquid diet.  Will advance to full liquid diet later today.  Monitor overnight.  DVT prophylaxis: enoxaparin (LOVENOX) injection 40 mg Start: 01/01/22 1600   Code Status: Full Code  Family Communication:  None present at bedside.  Plan of care discussed with patient in length and he/she verbalized understanding and agreed with it.  Status is: Observation The patient will require care spanning > 2 midnights and should be moved to inpatient because: Patient will need another night observation.   Estimated body mass index is 25.85 kg/m as calculated from the following:   Height as of this encounter: 4\' 11"  (1.499 m).   Weight as of this encounter: 58.1 kg.    Nutritional  Assessment: Body mass index is 25.85 kg/m. Seen by dietician.  I agree with the assessment and plan as outlined below: Nutrition Status:        . Skin Assessment: I have examined the patient's skin and I agree with the wound assessment as performed by the wound care RN as outlined below:    Consultants:  GI and general surgery  Procedures:  As above  Antimicrobials:  Anti-infectives (From admission, onward)    Start     Dose/Rate Route Frequency Ordered Stop   01/01/22 1249  ceFAZolin (ANCEF) 2-4 GM/100ML-% IVPB       Note to Pharmacy: 13/10/23 L: cabinet override      01/01/22 1249 01/02/22 0059   01/01/22 0600  piperacillin-tazobactam (ZOSYN) IVPB 3.375 g  Status:  Discontinued        3.375 g 12.5 mL/hr over 240 Minutes Intravenous Every 8 hours 01/01/22 0542 01/01/22 1524   12/31/21 2230  piperacillin-tazobactam (ZOSYN) IVPB 3.375 g        3.375 g 100 mL/hr over 30 Minutes Intravenous  Once 12/31/21 2228 12/31/21 2345         Subjective: Patient seen and examined after ERCP, was complaining of abdominal pain.  No nausea or cough.  No other complaint.  Objective: Vitals:   01/02/22 0834 01/02/22 1023 01/02/22 1030 01/02/22 1040  BP: (!) 126/56 131/78 107/65 126/83  Pulse: 66 94 (!) 108 93  Resp: 15 17 17 11   Temp:  98 F (36.7 C)    TempSrc:  Temporal    SpO2: 96% 99% 98% 99%  Weight:      Height:        Intake/Output Summary (  Last 24 hours) at 01/02/2022 1128 Last data filed at 01/02/2022 1009 Gross per 24 hour  Intake 2410 ml  Output 425 ml  Net 1985 ml   Filed Weights   12/31/21 2011  Weight: 58.1 kg    Examination:  General exam: Appears in pain. Respiratory system: Clear to auscultation. Respiratory effort normal. Cardiovascular system: S1 & S2 heard, RRR. No JVD, murmurs, rubs, gallops or clicks. No pedal edema. Gastrointestinal system: Abdomen is mildly distended, soft but tender and epigastric and right upper quadrant, no  organomegaly or masses felt. Normal bowel sounds heard. Central nervous system: Alert and oriented. No focal neurological deficits. Extremities: Symmetric 5 x 5 power. Skin: No rashes, lesions or ulcers Psychiatry: Judgement and insight appear normal. Mood & affect appropriate.    Data Reviewed: I have personally reviewed following labs and imaging studies  CBC: Recent Labs  Lab 12/31/21 2026 01/01/22 0544 01/02/22 0419  WBC 10.3 8.4 8.7  NEUTROABS  --   --  7.1  HGB 13.9 12.1 12.2  HCT 40.7 34.5* 35.9*  MCV 82.4 83.1 84.7  PLT 275 213 206   Basic Metabolic Panel: Recent Labs  Lab 12/31/21 2026 01/01/22 0544 01/02/22 0419  NA 136 138 137  K 3.9 3.5 3.8  CL 100 109 106  CO2 25 25 25   GLUCOSE 104* 99 109*  BUN 13 10 5*  CREATININE 0.75 0.71 0.66  CALCIUM 9.0 8.0* 8.8*   GFR: Estimated Creatinine Clearance: 82.8 mL/min (by C-G formula based on SCr of 0.66 mg/dL). Liver Function Tests: Recent Labs  Lab 12/31/21 2026 01/01/22 0544 01/02/22 0419  AST 102* 79* 266*  ALT 262* 205* 371*  ALKPHOS 99 84 93  BILITOT 1.0 1.1 1.3*  PROT 7.8 6.1* 7.0  ALBUMIN 4.3 3.3* 3.7   Recent Labs  Lab 12/31/21 2026 01/02/22 0419  LIPASE 36 25   No results for input(s): "AMMONIA" in the last 168 hours. Coagulation Profile: No results for input(s): "INR", "PROTIME" in the last 168 hours. Cardiac Enzymes: No results for input(s): "CKTOTAL", "CKMB", "CKMBINDEX", "TROPONINI" in the last 168 hours. BNP (last 3 results) No results for input(s): "PROBNP" in the last 8760 hours. HbA1C: No results for input(s): "HGBA1C" in the last 72 hours. CBG: No results for input(s): "GLUCAP" in the last 168 hours. Lipid Profile: No results for input(s): "CHOL", "HDL", "LDLCALC", "TRIG", "CHOLHDL", "LDLDIRECT" in the last 72 hours. Thyroid Function Tests: No results for input(s): "TSH", "T4TOTAL", "FREET4", "T3FREE", "THYROIDAB" in the last 72 hours. Anemia Panel: No results for input(s):  "VITAMINB12", "FOLATE", "FERRITIN", "TIBC", "IRON", "RETICCTPCT" in the last 72 hours. Sepsis Labs: No results for input(s): "PROCALCITON", "LATICACIDVEN" in the last 168 hours.  Recent Results (from the past 240 hour(s))  Surgical pcr screen     Status: None   Collection Time: 01/01/22  8:36 AM   Specimen: Nasal Mucosa; Nasal Swab  Result Value Ref Range Status   MRSA, PCR NEGATIVE NEGATIVE Final   Staphylococcus aureus NEGATIVE NEGATIVE Final    Comment: (NOTE) The Xpert SA Assay (FDA approved for NASAL specimens in patients 27 years of age and older), is one component of a comprehensive surveillance program. It is not intended to diagnose infection nor to guide or monitor treatment. Performed at [facility], [address] [place], [place] [address]      Radiology Studies: DG ERCP  Result Date: 01/02/2022 CLINICAL DATA:  Intraoperative cholangiogram demonstrated evidence choledocholithiasis. EXAM: ERCP TECHNIQUE: Multiple spot images obtained with  the fluoroscopic device and submitted for interpretation post-procedure. COMPARISON:  Intraoperative cholangiogram on 01/01/2022 FINDINGS: Imaging during ERCP demonstrates cannulation of the common bile duct with contrast injection demonstrating at least a single filling defect within the mid to distal common bile duct. Balloon sweep maneuver was performed to extract calculi. IMPRESSION: ERCP demonstrating evidence of choledocholithiasis with at least a single filling defect in the common bile duct. Balloon sweep maneuver was performed to extract calculi. Electronically Signed   By: Irish Lack M.D.   On: 01/02/2022 11:07   DG Cholangiogram Operative  Result Date: 01/01/2022 CLINICAL DATA:  Acute cholecystitis EXAM: INTRAOPERATIVE CHOLANGIOGRAM TECHNIQUE: Cholangiographic images from the C-arm fluoroscopic device were submitted for interpretation post-operatively. Please see the procedural report for the amount of  contrast and the fluoroscopy time utilized. FLUOROSCOPY: Radiation Exposure Index (as provided by the fluoroscopic device): 3.64 mGy reference air kerma COMPARISON:  CT abdomen/pelvis 12/31/2021; abdominal ultrasound 12/31/2021 FINDINGS: A cine clip obtained during intraoperative cholangiogram demonstrates cannulation of the cystic duct remanent and opacification of the biliary tree. There is a focal rounded filling defect within the distal common bile duct which does not appear obstructive but remains concerning for choledocholithiasis. Contrast material passes through the ampulla and into the duodenum. IMPRESSION: Nonobstructing choledocholithiasis with a single rounded filling defect in the distal common bile duct. Electronically Signed   By: Malachy Moan M.D.   On: 01/01/2022 15:33   US Abdomen Limited RUQ (LIVER/GB)  Result Date: 12/31/2021 CLINICAL DATA:  Right upper quadrant abdominal pain EXAM: ULTRASOUND ABDOMEN LIMITED RIGHT UPPER QUADRANT COMPARISON:  CT 9:56 p.m. FINDINGS: Gallbladder: The gallbladder is distended there is mild gallbladder wall thickening noted, and mild pericholecystic fluid is identified. No intraluminal stones or sludge is identified. The sonographic Eulah Pont sign is reportedly negative. Common bile duct: Diameter: 5-6 mm Liver: No focal lesion identified. Within normal limits in parenchymal echogenicity. Portal vein is patent on color Doppler imaging with normal direction of blood flow towards the liver. Other: None. IMPRESSION: 1. Distended gallbladder with mild gallbladder wall thickening and pericholecystic fluid. The findings can be seen in the setting of acute acalculous cholecystitis, though additional considerations should include inflammatory conditions of the liver or biliary tree. Correlation with liver enzymes may be helpful. Hepatic biliary scintigraphy may also be helpful to assess patency of the cystic duct. Electronically Signed   By: Helyn Numbers M.D.   On:  12/31/2021 23:28   CT ABDOMEN PELVIS W CONTRAST  Result Date: 12/31/2021 CLINICAL DATA:  Right upper quadrant abdominal pain EXAM: CT ABDOMEN AND PELVIS WITH CONTRAST TECHNIQUE: Multidetector CT imaging of the abdomen and pelvis was performed using the standard protocol following bolus administration of intravenous contrast. RADIATION DOSE REDUCTION: This exam was performed according to the departmental dose-optimization program which includes automated exposure control, adjustment of the mA and/or kV according to patient size and/or use of iterative reconstruction technique. CONTRAST:  OMNIPAQUE IOHEXOL 300 MG/ML  SOLN COMPARISON:  None Available. FINDINGS: Lower chest: No acute abnormality. Hepatobiliary: No suspicious focal liver abnormality is seen. Gallbladder wall thickening and intrahepatic biliary ductal dilation. Mild prominence of the common bile duct measuring 6 mm in diameter. No definite obstructing stone. Pancreas: Unremarkable. No pancreatic ductal dilatation or surrounding inflammatory changes. Spleen: Normal in size without focal abnormality. Adrenals/Urinary Tract: Adrenal glands are unremarkable. Kidneys are normal, without renal calculi, suspicious focal lesion, or hydronephrosis. Bladder is unremarkable. Stomach/Bowel: Stomach is within normal limits. No evidence of bowel wall thickening, distention, or inflammatory  changes. The appendix is normal. Moderate colonic stool load. Fecalization of the terminal ileum. Vascular/Lymphatic: No significant vascular findings are present. No enlarged abdominal or pelvic lymph nodes. Reproductive: Tubal ligation. Unremarkable appearance of the right ovary and uterus. Mass within the left ovary with fat density compatible with dermoid measuring 3.0 cm. This has increased in size since ultrasound 07/11/2016. Other: Small amount of free fluid in the pelvis, likely physiologic. No free intraperitoneal air. Musculoskeletal: No acute or significant  osseous findings. IMPRESSION: Findings suggestive of acute cholecystitis. No definite cholelithiasis. Fat density mass in the right ovary consistent with dermoid cyst, increased since 07/11/2016. Moderate fecal burden with fecalization of the terminal ileum. Recommend correlation for constipation. Electronically Signed   By: Minerva Fester M.D.   On: 12/31/2021 22:19    Scheduled Meds:  enoxaparin (LOVENOX) injection  40 mg Subcutaneous Q24H   Continuous Infusions:  lactated ringers 100 mL/hr at 01/02/22 0920     LOS: 1 day   Hughie Closs, MD [facility]  01/02/2022, 11:28 AM   *Please note that this is a verbal dictation therefore any spelling or grammatical errors are due to the "[employer]agon [employer] One" system interpretation.  Please page via Amion and do not message via secure chat for urgent patient care matters. Secure chat can be used for non urgent patient care matters.  How to contact the [facility] Attending or Consulting provider 7A - 7P or covering provider during after hours 7P -7A, for this patient?  Check the care team in [facility] and look for a) attending/consulting [facility] provider listed and b) the [facility] team listed. Page or secure chat 7A-7P. Log into [email or web address] and use [employer] universal password to access. If you do not have the password, please contact the hospital operator. Locate the [facility] provider you are looking for under [facility] and page to a number that you can be directly reached. If you still have difficulty reaching the provider, please page the [facility] (Director on Call) for the Hospitalists listed on amion for assistance.

## 2022-01-02 NOTE — Progress Notes (Signed)
Day of Surgery   Subjective/Chief Complaint: Still having moderate pain and bloating Successful ERCP earlier today   Objective: Vital signs in last 24 hours: Temp:  [97.7 F (36.5 C)-99.2 F (37.3 C)] 97.7 F (36.5 C) (11/11 1153) Pulse Rate:  [53-108] 66 (11/11 1153) Resp:  [11-18] 18 (11/11 1153) BP: (104-131)/(56-83) 125/72 (11/11 1153) SpO2:  [96 %-100 %] 100 % (11/11 1153) Last BM Date : 12/31/21  Intake/Output from previous day: 11/10 0701 - 11/11 0700 In: 1910 [P.O.:360; I.V.:1500; IV Piggyback:50] Out: 425 [Urine:400; Blood:25] Intake/Output this shift: Total I/O In: 740 [P.O.:240; I.V.:500] Out: 0   Exam: Awake and alert Abdomen soft, with mild fullness, incisions clean  Lab Results:  Recent Labs    01/01/22 0544 01/02/22 0419  WBC 8.4 8.7  HGB 12.1 12.2  HCT 34.5* 35.9*  PLT 213 206   BMET Recent Labs    01/01/22 0544 01/02/22 0419  NA 138 137  K 3.5 3.8  CL 109 106  CO2 25 25  GLUCOSE 99 109*  BUN 10 5*  CREATININE 0.71 0.66  CALCIUM 8.0* 8.8*   PT/INR No results for input(s): "LABPROT", "INR" in the last 72 hours. ABG No results for input(s): "PHART", "HCO3" in the last 72 hours.  Invalid input(s): "PCO2", "PO2"  Studies/Results: DG ERCP  Result Date: 01/02/2022 CLINICAL DATA:  Intraoperative cholangiogram demonstrated evidence choledocholithiasis. EXAM: ERCP TECHNIQUE: Multiple spot images obtained with the fluoroscopic device and submitted for interpretation post-procedure. COMPARISON:  Intraoperative cholangiogram on 01/01/2022 FINDINGS: Imaging during ERCP demonstrates cannulation of the common bile duct with contrast injection demonstrating at least a single filling defect within the mid to distal common bile duct. Balloon sweep maneuver was performed to extract calculi. IMPRESSION: ERCP demonstrating evidence of choledocholithiasis with at least a single filling defect in the common bile duct. Balloon sweep maneuver was performed to  extract calculi. Electronically Signed   By: Irish Lack M.D.   On: 01/02/2022 11:07   DG Cholangiogram Operative  Result Date: 01/01/2022 CLINICAL DATA:  Acute cholecystitis EXAM: INTRAOPERATIVE CHOLANGIOGRAM TECHNIQUE: Cholangiographic images from the C-arm fluoroscopic device were submitted for interpretation post-operatively. Please see the procedural report for the amount of contrast and the fluoroscopy time utilized. FLUOROSCOPY: Radiation Exposure Index (as provided by the fluoroscopic device): 3.64 mGy reference air kerma COMPARISON:  CT abdomen/pelvis 12/31/2021; abdominal ultrasound 12/31/2021 FINDINGS: A cine clip obtained during intraoperative cholangiogram demonstrates cannulation of the cystic duct remanent and opacification of the biliary tree. There is a focal rounded filling defect within the distal common bile duct which does not appear obstructive but remains concerning for choledocholithiasis. Contrast material passes through the ampulla and into the duodenum. IMPRESSION: Nonobstructing choledocholithiasis with a single rounded filling defect in the distal common bile duct. Electronically Signed   By: Malachy Moan M.D.   On: 01/01/2022 15:33   US Abdomen Limited RUQ (LIVER/GB)  Result Date: 12/31/2021 CLINICAL DATA:  Right upper quadrant abdominal pain EXAM: ULTRASOUND ABDOMEN LIMITED RIGHT UPPER QUADRANT COMPARISON:  CT 9:56 p.m. FINDINGS: Gallbladder: The gallbladder is distended there is mild gallbladder wall thickening noted, and mild pericholecystic fluid is identified. No intraluminal stones or sludge is identified. The sonographic Eulah Pont sign is reportedly negative. Common bile duct: Diameter: 5-6 mm Liver: No focal lesion identified. Within normal limits in parenchymal echogenicity. Portal vein is patent on color Doppler imaging with normal direction of blood flow towards the liver. Other: None. IMPRESSION: 1. Distended gallbladder with mild gallbladder wall thickening  and pericholecystic  fluid. The findings can be seen in the setting of acute acalculous cholecystitis, though additional considerations should include inflammatory conditions of the liver or biliary tree. Correlation with liver enzymes may be helpful. Hepatic biliary scintigraphy may also be helpful to assess patency of the cystic duct. Electronically Signed   By: Helyn Numbers M.D.   On: 12/31/2021 23:28   CT ABDOMEN PELVIS W CONTRAST  Result Date: 12/31/2021 CLINICAL DATA:  Right upper quadrant abdominal pain EXAM: CT ABDOMEN AND PELVIS WITH CONTRAST TECHNIQUE: Multidetector CT imaging of the abdomen and pelvis was performed using the standard protocol following bolus administration of intravenous contrast. RADIATION DOSE REDUCTION: This exam was performed according to the departmental dose-optimization program which includes automated exposure control, adjustment of the mA and/or kV according to patient size and/or use of iterative reconstruction technique. CONTRAST:  OMNIPAQUE IOHEXOL 300 MG/ML  SOLN COMPARISON:  None Available. FINDINGS: Lower chest: No acute abnormality. Hepatobiliary: No suspicious focal liver abnormality is seen. Gallbladder wall thickening and intrahepatic biliary ductal dilation. Mild prominence of the common bile duct measuring 6 mm in diameter. No definite obstructing stone. Pancreas: Unremarkable. No pancreatic ductal dilatation or surrounding inflammatory changes. Spleen: Normal in size without focal abnormality. Adrenals/Urinary Tract: Adrenal glands are unremarkable. Kidneys are normal, without renal calculi, suspicious focal lesion, or hydronephrosis. Bladder is unremarkable. Stomach/Bowel: Stomach is within normal limits. No evidence of bowel wall thickening, distention, or inflammatory changes. The appendix is normal. Moderate colonic stool load. Fecalization of the terminal ileum. Vascular/Lymphatic: No significant vascular findings are present. No enlarged abdominal or  pelvic lymph nodes. Reproductive: Tubal ligation. Unremarkable appearance of the right ovary and uterus. Mass within the left ovary with fat density compatible with dermoid measuring 3.0 cm. This has increased in size since ultrasound 07/11/2016. Other: Small amount of free fluid in the pelvis, likely physiologic. No free intraperitoneal air. Musculoskeletal: No acute or significant osseous findings. IMPRESSION: Findings suggestive of acute cholecystitis. No definite cholelithiasis. Fat density mass in the right ovary consistent with dermoid cyst, increased since 07/11/2016. Moderate fecal burden with fecalization of the terminal ileum. Recommend correlation for constipation. Electronically Signed   By: Minerva Fester M.D.   On: 12/31/2021 22:19    Anti-infectives: Anti-infectives (From admission, onward)    Start     Dose/Rate Route Frequency Ordered Stop   01/01/22 1249  ceFAZolin (ANCEF) 2-4 GM/100ML-% IVPB       Note to Pharmacy: Leroy Libman L: cabinet override      01/01/22 1249 01/02/22 0059   01/01/22 0600  piperacillin-tazobactam (ZOSYN) IVPB 3.375 g  Status:  Discontinued        3.375 g 12.5 mL/hr over 240 Minutes Intravenous Every 8 hours 01/01/22 0542 01/01/22 1524   12/31/21 2230  piperacillin-tazobactam (ZOSYN) IVPB 3.375 g        3.375 g 100 mL/hr over 30 Minutes Intravenous  Once 12/31/21 2228 12/31/21 2345       Assessment/Plan: S/p lap chole with IOC and finding of CBD stone yesterday now s/p ERCP with stone removal  Ambulate Clear liquids Hopefully home tomorrow   Jamie Hansen 01/02/2022

## 2022-01-02 NOTE — Anesthesia Procedure Notes (Signed)
Procedure Name: Intubation Date/Time: 01/02/2022 9:27 AM  Performed by: Talbot Grumbling, CRNAPre-anesthesia Checklist: Patient identified, Emergency Drugs available, Suction available and Patient being monitored Patient Re-evaluated:Patient Re-evaluated prior to induction Oxygen Delivery Method: Circle system utilized Induction Type: IV induction and Rapid sequence Laryngoscope Size: Mac and 3 Grade View: Grade I Tube type: Oral Tube size: 7.0 mm Number of attempts: 1 Airway Equipment and Method: Stylet Placement Confirmation: ETT inserted through vocal cords under direct vision, positive ETCO2 and breath sounds checked- equal and bilateral Secured at: 21 cm Tube secured with: Tape Dental Injury: Teeth and Oropharynx as per pre-operative assessment

## 2022-01-03 ENCOUNTER — Encounter (HOSPITAL_COMMUNITY): Payer: Self-pay | Admitting: Gastroenterology

## 2022-01-03 DIAGNOSIS — K859 Acute pancreatitis without necrosis or infection, unspecified: Secondary | ICD-10-CM | POA: Insufficient documentation

## 2022-01-03 DIAGNOSIS — K9189 Other postprocedural complications and disorders of digestive system: Secondary | ICD-10-CM | POA: Insufficient documentation

## 2022-01-03 LAB — HEPATIC FUNCTION PANEL
ALT: 510 U/L — ABNORMAL HIGH (ref 0–44)
AST: 583 U/L — ABNORMAL HIGH (ref 15–41)
Albumin: 3.7 g/dL (ref 3.5–5.0)
Alkaline Phosphatase: 102 U/L (ref 38–126)
Bilirubin, Direct: 0.9 mg/dL — ABNORMAL HIGH (ref 0.0–0.2)
Indirect Bilirubin: 1 mg/dL — ABNORMAL HIGH (ref 0.3–0.9)
Total Bilirubin: 1.9 mg/dL — ABNORMAL HIGH (ref 0.3–1.2)
Total Protein: 6.6 g/dL (ref 6.5–8.1)

## 2022-01-03 LAB — BASIC METABOLIC PANEL
Anion gap: 9 (ref 5–15)
BUN: 7 mg/dL (ref 6–20)
CO2: 25 mmol/L (ref 22–32)
Calcium: 8.4 mg/dL — ABNORMAL LOW (ref 8.9–10.3)
Chloride: 104 mmol/L (ref 98–111)
Creatinine, Ser: 0.62 mg/dL (ref 0.44–1.00)
GFR, Estimated: >60 mL/min (ref >60)
Glucose, Bld: 123 mg/dL — ABNORMAL HIGH (ref 70–99)
Potassium: 3.5 mmol/L (ref 3.5–5.1)
Sodium: 138 mmol/L (ref 135–145)

## 2022-01-03 LAB — CBC
HCT: 32.5 % — ABNORMAL LOW (ref 36.0–46.0)
Hemoglobin: 11.2 g/dL — ABNORMAL LOW (ref 12.0–15.0)
MCH: 29 pg (ref 26.0–34.0)
MCHC: 34.5 g/dL (ref 30.0–36.0)
MCV: 84.2 fL (ref 80.0–100.0)
Platelets: 196 10*3/uL (ref 150–400)
RBC: 3.86 MIL/uL — ABNORMAL LOW (ref 3.87–5.11)
RDW: 13.7 % (ref 11.5–15.5)
WBC: 8.9 10*3/uL (ref 4.0–10.5)
nRBC: 0 % (ref 0.0–0.2)

## 2022-01-03 LAB — LIPASE, BLOOD: Lipase: 123 U/L — ABNORMAL HIGH (ref 11–51)

## 2022-01-03 MED ORDER — FENTANYL CITRATE PF 50 MCG/ML IJ SOSY
12.5000 ug | PREFILLED_SYRINGE | Freq: Once | INTRAMUSCULAR | Status: AC | PRN
  Administered 2022-01-03: 12.5 ug via INTRAVENOUS
  Filled 2022-01-03: qty 1

## 2022-01-03 MED ORDER — HYDROMORPHONE HCL 1 MG/ML IJ SOLN
1.0000 mg | INTRAMUSCULAR | Status: AC | PRN
  Administered 2022-01-03: 1 mg via INTRAVENOUS
  Filled 2022-01-03: qty 1

## 2022-01-03 MED ORDER — HYDROMORPHONE HCL 1 MG/ML IJ SOLN
0.5000 mg | INTRAMUSCULAR | Status: DC | PRN
  Administered 2022-01-03: 0.5 mg via INTRAVENOUS
  Filled 2022-01-03: qty 0.5

## 2022-01-03 NOTE — Progress Notes (Signed)
PROGRESS NOTE    [name]  DIY:[id number] DOB: 1996-02-05 DOA: 12/31/2021 PCP: Patient, No Pcp Per   Brief Narrative:  Jamie Hansen is a 26 y.o. female with no past medical history presented with right upper quadrant, pain for 48 hours, she was initially seen at outside hospital and had ultrasound that was discharged home today.  Gastritis, she return to [facility] this time she was diagnosed with acute acalculus cholecystitis and admitted to hospital service, general surgery was consulted.  She underwent laparoscopic cholecystectomy with intraoperative cholangiogram showed distal CBD, GI was consulted and she underwent ERCP.  Assessment & Plan:   Principal Problem:   Acalculous cholecystitis Active Problems:   History of laparoscopic cholecystectomy   Choledocholithiasis with acute cholecystitis   Acute cholecystitis   Post-ERCP acute pancreatitis  Acute acalculous cholecystitis/CBD stone/post ERCP pancreatitis: S/p laparoscopic cholecystectomy by general surgery on 07/03/2019, was found to have distal CBD stone on intraoperative cholangiogram, GI was consulted, she underwent ERCP 01/02/2022.  Patient's abdominal pain is better but tenderness is worse.  Significantly elevated LFTs and lipase indicative of post ERCP pancreatitis.  GI has put her back with n.p.o. with ice chips only.  Appreciate GI and general surgery help.  DVT prophylaxis: enoxaparin (LOVENOX) injection 40 mg Start: 01/01/22 1600   Code Status: Full Code  Family Communication: Significant other present at bedside.  Plan of care discussed with patient in length and he/she verbalized understanding and agreed with it.  Status is: Inpatient Remains inpatient appropriate because: Patient is now developing post ERCP pancreatitis     Estimated body mass index is 25.85 kg/m as calculated from the following:   Height as of this encounter: 4\' 11"  (1.499 m).   Weight as of this encounter: 58.1  kg.    Nutritional Assessment: Body mass index is 25.85 kg/m. Seen by dietician.  I agree with the assessment and plan as outlined below: Nutrition Status:        . Skin Assessment: I have examined the patient's skin and I agree with the wound assessment as performed by the wound care RN as outlined below:    Consultants:  GI and general surgery  Procedures:  As above  Antimicrobials:  Anti-infectives (From admission, onward)    Start     Dose/Rate Route Frequency Ordered Stop   01/01/22 1249  ceFAZolin (ANCEF) 2-4 GM/100ML-% IVPB       Note to Pharmacy: 13/10/23 L: cabinet override      01/01/22 1249 01/02/22 0059   01/01/22 0600  piperacillin-tazobactam (ZOSYN) IVPB 3.375 g  Status:  Discontinued        3.375 g 12.5 mL/hr over 240 Minutes Intravenous Every 8 hours 01/01/22 0542 01/01/22 1524   12/31/21 2230  piperacillin-tazobactam (ZOSYN) IVPB 3.375 g        3.375 g 100 mL/hr over 30 Minutes Intravenous  Once 12/31/21 2228 12/31/21 2345         Subjective:  Seen and examined.  She says that her abdominal pain improved but she is still nauseous even with the liquid diet.  Objective: Vitals:   01/03/22 0046 01/03/22 0211 01/03/22 0553 01/03/22 0958  BP:  (!) 123/52 120/77 137/88  Pulse: 76 94 66 69  Resp: 20 18 16 17   Temp:  98.9 F (37.2 C) 98.6 F (37 C) 97.6 F (36.4 C)  TempSrc:  Oral Oral Oral  SpO2: 99% 100% 99% 95%  Weight:      Height:  Intake/Output Summary (Last 24 hours) at 01/03/2022 1112 Last data filed at 01/03/2022 1017 Gross per 24 hour  Intake 1750.22 ml  Output --  Net 1750.22 ml    Filed Weights   12/31/21 2011  Weight: 58.1 kg    Examination:  General exam: Appears calm and comfortable  Respiratory system: Clear to auscultation. Respiratory effort normal. Cardiovascular system: S1 & S2 heard, RRR. No JVD, murmurs, rubs, gallops or clicks. No pedal edema. Gastrointestinal system: Abdomen is slightly  distended, soft but tender at epigastric but mostly at right upper quadrant, no organomegaly or masses felt. Normal bowel sounds heard. Central nervous system: Alert and oriented. No focal neurological deficits. Extremities: Symmetric 5 x 5 power. Skin: No rashes, lesions or ulcers.  Psychiatry: Judgement and insight appear normal. Mood & affect appropriate.   Data Reviewed: I have personally reviewed following labs and imaging studies  CBC: Recent Labs  Lab 12/31/21 2026 01/01/22 0544 01/02/22 0419 01/03/22 0343  WBC 10.3 8.4 8.7 8.9  NEUTROABS  --   --  7.1  --   HGB 13.9 12.1 12.2 11.2*  HCT 40.7 34.5* 35.9* 32.5*  MCV 82.4 83.1 84.7 84.2  PLT 275 213 206 196    Basic Metabolic Panel: Recent Labs  Lab 12/31/21 2026 01/01/22 0544 01/02/22 0419 01/03/22 0343  NA 136 138 137 138  K 3.9 3.5 3.8 3.5  CL 100 109 106 104  CO2 25 25 25 25   GLUCOSE 104* 99 109* 123*  BUN 13 10 5* 7  CREATININE 0.75 0.71 0.66 0.62  CALCIUM 9.0 8.0* 8.8* 8.4*    GFR: Estimated Creatinine Clearance: 82.8 mL/min (by C-G formula based on SCr of 0.62 mg/dL). Liver Function Tests: Recent Labs  Lab 12/31/21 2026 01/01/22 0544 01/02/22 0419 01/03/22 0343  AST 102* 79* 266* 583*  ALT 262* 205* 371* 510*  ALKPHOS 99 84 93 102  BILITOT 1.0 1.1 1.3* 1.9*  PROT 7.8 6.1* 7.0 6.6  ALBUMIN 4.3 3.3* 3.7 3.7    Recent Labs  Lab 12/31/21 2026 01/02/22 0419 01/03/22 0343  LIPASE 36 25 123*    No results for input(s): "AMMONIA" in the last 168 hours. Coagulation Profile: No results for input(s): "INR", "PROTIME" in the last 168 hours. Cardiac Enzymes: No results for input(s): "CKTOTAL", "CKMB", "CKMBINDEX", "TROPONINI" in the last 168 hours. BNP (last 3 results) No results for input(s): "PROBNP" in the last 8760 hours. HbA1C: No results for input(s): "HGBA1C" in the last 72 hours. CBG: No results for input(s): "GLUCAP" in the last 168 hours. Lipid Profile: No results for input(s):  "CHOL", "HDL", "LDLCALC", "TRIG", "CHOLHDL", "LDLDIRECT" in the last 72 hours. Thyroid Function Tests: No results for input(s): "TSH", "T4TOTAL", "FREET4", "T3FREE", "THYROIDAB" in the last 72 hours. Anemia Panel: No results for input(s): "VITAMINB12", "FOLATE", "FERRITIN", "TIBC", "IRON", "RETICCTPCT" in the last 72 hours. Sepsis Labs: No results for input(s): "PROCALCITON", "LATICACIDVEN" in the last 168 hours.  Recent Results (from the past 240 hour(s))  Surgical pcr screen     Status: None   Collection Time: 01/01/22  8:36 AM   Specimen: Nasal Mucosa; Nasal Swab  Result Value Ref Range Status   MRSA, PCR NEGATIVE NEGATIVE Final   Staphylococcus aureus NEGATIVE NEGATIVE Final    Comment: (NOTE) The Xpert SA Assay (FDA approved for NASAL specimens in patients 64 years of age and older), is one component of a comprehensive surveillance program. It is not intended to diagnose infection nor to guide or monitor treatment. Performed  at [facility], [address] [place], [place] [address]      Radiology Studies: DG ERCP  Result Date: 01/02/2022 CLINICAL DATA:  Intraoperative cholangiogram demonstrated evidence choledocholithiasis. EXAM: ERCP TECHNIQUE: Multiple spot images obtained with the fluoroscopic device and submitted for interpretation post-procedure. COMPARISON:  Intraoperative cholangiogram on 01/01/2022 FINDINGS: Imaging during ERCP demonstrates cannulation of the common bile duct with contrast injection demonstrating at least a single filling defect within the mid to distal common bile duct. Balloon sweep maneuver was performed to extract calculi. IMPRESSION: ERCP demonstrating evidence of choledocholithiasis with at least a single filling defect in the common bile duct. Balloon sweep maneuver was performed to extract calculi. Electronically Signed   By: Irish Lack M.D.   On: 01/02/2022 11:07   DG Cholangiogram Operative  Result Date:  01/01/2022 CLINICAL DATA:  Acute cholecystitis EXAM: INTRAOPERATIVE CHOLANGIOGRAM TECHNIQUE: Cholangiographic images from the C-arm fluoroscopic device were submitted for interpretation post-operatively. Please see the procedural report for the amount of contrast and the fluoroscopy time utilized. FLUOROSCOPY: Radiation Exposure Index (as provided by the fluoroscopic device): 3.64 mGy reference air kerma COMPARISON:  CT abdomen/pelvis 12/31/2021; abdominal ultrasound 12/31/2021 FINDINGS: A cine clip obtained during intraoperative cholangiogram demonstrates cannulation of the cystic duct remanent and opacification of the biliary tree. There is a focal rounded filling defect within the distal common bile duct which does not appear obstructive but remains concerning for choledocholithiasis. Contrast material passes through the ampulla and into the duodenum. IMPRESSION: Nonobstructing choledocholithiasis with a single rounded filling defect in the distal common bile duct. Electronically Signed   By: Malachy Moan M.D.   On: 01/01/2022 15:33    Scheduled Meds:  enoxaparin (LOVENOX) injection  40 mg Subcutaneous Q24H   Continuous Infusions:  lactated ringers 100 mL/hr at 01/03/22 1033     LOS: 2 days   Hughie Closs, MD [facility]  01/03/2022, 11:12 AM   *Please note that this is a verbal dictation therefore any spelling or grammatical errors are due to the "Dragon [employer] One" system interpretation.  Please page via Amion and do not message via secure chat for urgent patient care matters. Secure chat can be used for non urgent patient care matters.  How to contact the [facility] Attending or Consulting provider 7A - 7P or covering provider during after hours 7P -7A, for this patient?  Check the care team in [facility] and look for a) attending/consulting [facility] provider listed and b) the [facility] team listed. Page or secure chat 7A-7P. Log into [email or web address] and use [place]'s universal password to access.  If you do not have the password, please contact the hospital operator. Locate the [facility] provider you are looking for under [facility] and page to a number that you can be directly reached. If you still have difficulty reaching the provider, please page the [facility] (Director on Call) for the Hospitalists listed on amion for assistance.

## 2022-01-03 NOTE — Progress Notes (Addendum)
[facility] Gastroenterology Progress Note  Jamie Hansen 25 y.o. 11/24/1995   Subjective: Worsened abdominal pain yesterday but pain better controlled today. Significant other in room.  Objective: Vital signs: Vitals:   01/03/22 0553 01/03/22 0958  BP: 120/77 137/88  Pulse: 66 69  Resp: 16 17  Temp: 98.6 F (37 C) 97.6 F (36.4 C)  SpO2: 99% 95%    Physical Exam: Gen: lethargic, well-nourished, no acute distress  HEENT: anicteric sclera CV: RRR Chest: CTA B Abd: RUQ and epigastric tenderness with guarding, soft, nondistended, +BS Ext: no edema  Lab Results: Recent Labs    01/02/22 0419 01/03/22 0343  NA 137 138  K 3.8 3.5  CL 106 104  CO2 25 25  GLUCOSE 109* 123*  BUN 5* 7  CREATININE 0.66 0.62  CALCIUM 8.8* 8.4*   Recent Labs    01/02/22 0419 01/03/22 0343  AST 266* 583*  ALT 371* 510*  ALKPHOS 93 102  BILITOT 1.3* 1.9*  PROT 7.0 6.6  ALBUMIN 3.7 3.7   Recent Labs    01/02/22 0419 01/03/22 0343  WBC 8.7 8.9  NEUTROABS 7.1  --   HGB 12.2 11.2*  HCT 35.9* 32.5*  MCV 84.7 84.2  PLT 206 196   Lipase 123 (25 on 01/02/22)   Assessment/Plan: S/P lap chole with positive IOC 11/10 and S/P ERCP with sphincterotomy 11/11 with one stone removed from CBD. Post-ERCP pain and increased LFTs and lipase concerning for post-ERCP pancreatitis. NPO except ice chips and sips of clears. Pain meds prn. Supportive care. Dr. Ewing Hansen will f/u tomorrow.   Jamie Hansen 01/03/2022, 10:56 AM  Questions please call [phone] ID: Jamie Hansen, female   DOB: 11/22/1995, 26 y.o.   MRN: [id number]

## 2022-01-03 NOTE — Plan of Care (Signed)
  Problem: Coping: Goal: Level of anxiety will decrease Outcome: Progressing   Problem: Pain Managment: Goal: General experience of comfort will improve Outcome: Progressing   

## 2022-01-03 NOTE — Progress Notes (Signed)
1 Day Post-Op   Subjective/Chief Complaint:  Patient with epigastric pain and evidence of post ERCP pancreatitis.  On clear liquids.  Pain was poorly controlled yesterday but better today.   Objective: Vital signs in last 24 hours: Temp:  [97.7 F (36.5 C)-98.9 F (37.2 C)] 98.6 F (37 C) (11/12 0553) Pulse Rate:  [66-108] 66 (11/12 0553) Resp:  [11-20] 16 (11/12 0553) BP: (107-140)/(52-83) 120/77 (11/12 0553) SpO2:  [98 %-100 %] 99 % (11/12 0553) Last BM Date : 12/31/21  Intake/Output from previous day: 11/11 0701 - 11/12 0700 In: 1770.2 [P.O.:720; I.V.:1050.2] Out: 0  Intake/Output this shift: No intake/output data recorded.  Port sites intact.  Appropriate epigastric tenderness.  No peritonitis.  Lab Results:  Recent Labs    01/02/22 0419 01/03/22 0343  WBC 8.7 8.9  HGB 12.2 11.2*  HCT 35.9* 32.5*  PLT 206 196   BMET Recent Labs    01/02/22 0419 01/03/22 0343  NA 137 138  K 3.8 3.5  CL 106 104  CO2 25 25  GLUCOSE 109* 123*  BUN 5* 7  CREATININE 0.66 0.62  CALCIUM 8.8* 8.4*   PT/INR No results for input(s): "LABPROT", "INR" in the last 72 hours. ABG No results for input(s): "PHART", "HCO3" in the last 72 hours.  Invalid input(s): "PCO2", "PO2"  Studies/Results: DG ERCP  Result Date: 01/02/2022 CLINICAL DATA:  Intraoperative cholangiogram demonstrated evidence choledocholithiasis. EXAM: ERCP TECHNIQUE: Multiple spot images obtained with the fluoroscopic device and submitted for interpretation post-procedure. COMPARISON:  Intraoperative cholangiogram on 01/01/2022 FINDINGS: Imaging during ERCP demonstrates cannulation of the common bile duct with contrast injection demonstrating at least a single filling defect within the mid to distal common bile duct. Balloon sweep maneuver was performed to extract calculi. IMPRESSION: ERCP demonstrating evidence of choledocholithiasis with at least a single filling defect in the common bile duct. Balloon sweep  maneuver was performed to extract calculi. Electronically Signed   By: Irish Lack M.D.   On: 01/02/2022 11:07   DG Cholangiogram Operative  Result Date: 01/01/2022 CLINICAL DATA:  Acute cholecystitis EXAM: INTRAOPERATIVE CHOLANGIOGRAM TECHNIQUE: Cholangiographic images from the C-arm fluoroscopic device were submitted for interpretation post-operatively. Please see the procedural report for the amount of contrast and the fluoroscopy time utilized. FLUOROSCOPY: Radiation Exposure Index (as provided by the fluoroscopic device): 3.64 mGy reference air kerma COMPARISON:  CT abdomen/pelvis 12/31/2021; abdominal ultrasound 12/31/2021 FINDINGS: A cine clip obtained during intraoperative cholangiogram demonstrates cannulation of the cystic duct remanent and opacification of the biliary tree. There is a focal rounded filling defect within the distal common bile duct which does not appear obstructive but remains concerning for choledocholithiasis. Contrast material passes through the ampulla and into the duodenum. IMPRESSION: Nonobstructing choledocholithiasis with a single rounded filling defect in the distal common bile duct. Electronically Signed   By: Malachy Moan M.D.   On: 01/01/2022 15:33    Anti-infectives: Anti-infectives (From admission, onward)    Start     Dose/Rate Route Frequency Ordered Stop   01/01/22 1249  ceFAZolin (ANCEF) 2-4 GM/100ML-% IVPB       Note to Pharmacy: Leroy Libman L: cabinet override      01/01/22 1249 01/02/22 0059   01/01/22 0600  piperacillin-tazobactam (ZOSYN) IVPB 3.375 g  Status:  Discontinued        3.375 g 12.5 mL/hr over 240 Minutes Intravenous Every 8 hours 01/01/22 0542 01/01/22 1524   12/31/21 2230  piperacillin-tazobactam (ZOSYN) IVPB 3.375 g  3.375 g 100 mL/hr over 30 Minutes Intravenous  Once 12/31/21 2228 12/31/21 2345       Assessment/Plan: s/p Procedure(s): ENDOSCOPIC RETROGRADE CHOLANGIOPANCREATOGRAPHY (ERCP)  (N/A) SPHINCTEROTOMY REMOVAL OF STONES Status post laparoscopic cholecystectomy  Post ERCP pancreatitis-continue clears per GI medicine.  Can be discharged home when tolerating diet and pain controlled.  LOS: 2 days    Dortha Schwalbe MD  01/03/2022

## 2022-01-04 LAB — CBC WITH DIFFERENTIAL/PLATELET
Abs Immature Granulocytes: 0.03 10*3/uL (ref 0.00–0.07)
Basophils Absolute: 0 10*3/uL (ref 0.0–0.1)
Basophils Relative: 1 %
Eosinophils Absolute: 0.1 10*3/uL (ref 0.0–0.5)
Eosinophils Relative: 1 %
HCT: 35.7 % — ABNORMAL LOW (ref 36.0–46.0)
Hemoglobin: 12 g/dL (ref 12.0–15.0)
Immature Granulocytes: 1 %
Lymphocytes Relative: 34 %
Lymphs Abs: 1.9 10*3/uL (ref 0.7–4.0)
MCH: 28.6 pg (ref 26.0–34.0)
MCHC: 33.6 g/dL (ref 30.0–36.0)
MCV: 85 fL (ref 80.0–100.0)
Monocytes Absolute: 0.4 10*3/uL (ref 0.1–1.0)
Monocytes Relative: 6 %
Neutro Abs: 3.2 10*3/uL (ref 1.7–7.7)
Neutrophils Relative %: 57 %
Platelets: 199 10*3/uL (ref 150–400)
RBC: 4.2 MIL/uL (ref 3.87–5.11)
RDW: 13.9 % (ref 11.5–15.5)
WBC: 5.7 10*3/uL (ref 4.0–10.5)
nRBC: 0 % (ref 0.0–0.2)

## 2022-01-04 LAB — COMPREHENSIVE METABOLIC PANEL
ALT: 479 U/L — ABNORMAL HIGH (ref 0–44)
AST: 180 U/L — ABNORMAL HIGH (ref 15–41)
Albumin: 3.6 g/dL (ref 3.5–5.0)
Alkaline Phosphatase: 115 U/L (ref 38–126)
Anion gap: 9 (ref 5–15)
BUN: <5 mg/dL — ABNORMAL LOW (ref 6–20)
CO2: 26 mmol/L (ref 22–32)
Calcium: 8.5 mg/dL — ABNORMAL LOW (ref 8.9–10.3)
Chloride: 100 mmol/L (ref 98–111)
Creatinine, Ser: 0.73 mg/dL (ref 0.44–1.00)
GFR, Estimated: >60 mL/min (ref >60)
Glucose, Bld: 91 mg/dL (ref 70–99)
Potassium: 3.7 mmol/L (ref 3.5–5.1)
Sodium: 135 mmol/L (ref 135–145)
Total Bilirubin: 1.9 mg/dL — ABNORMAL HIGH (ref 0.3–1.2)
Total Protein: 6.9 g/dL (ref 6.5–8.1)

## 2022-01-04 LAB — SURGICAL PATHOLOGY

## 2022-01-04 MED ORDER — OXYCODONE HCL 5 MG PO TABS: 5.0000 mg | ORAL_TABLET | Freq: Four times a day (QID) | ORAL | 0 refills | Status: DC | PRN

## 2022-01-04 MED ORDER — POLYVINYL ALCOHOL 1.4 % OP SOLN
1.0000 [drp] | OPHTHALMIC | Status: DC | PRN
  Administered 2022-01-04: 1 [drp] via OPHTHALMIC
  Filled 2022-01-04: qty 15

## 2022-01-04 NOTE — Progress Notes (Signed)
PROGRESS NOTE    Jamie Hansen  IWL:[id number] DOB: 09-Nov-1995 DOA: 12/31/2021 PCP: Patient, No Pcp Per   Brief Narrative:  Jamie Hansen is a 26 y.o. female with no past medical history presented with right upper quadrant, pain for 48 hours, she was initially seen at outside hospital and had ultrasound that was discharged home today.  Gastritis, she return to [facility] this time she was diagnosed with acute acalculus cholecystitis and admitted to hospital service, general surgery was consulted.  She underwent laparoscopic cholecystectomy with intraoperative cholangiogram showed distal CBD, GI was consulted and she underwent ERCP.  Assessment & Plan:   Principal Problem:   Acalculous cholecystitis Active Problems:   History of laparoscopic cholecystectomy   Choledocholithiasis with acute cholecystitis   Acute cholecystitis   Post-ERCP acute pancreatitis  Acute acalculous cholecystitis/CBD stone/post ERCP pancreatitis: S/p laparoscopic cholecystectomy by general surgery on 07/03/2019, was found to have distal CBD stone on intraoperative cholangiogram, GI was consulted, she underwent ERCP 01/02/2022.  He developed post ERCP pancreatitis with elevated LFTs and lipase on 01/11/2022.  She was back down to n.p.o. with ice chips.  She is feeling better today but still has abdominal pain with movement.  She still has right upper quadrant and epigastric tenderness.  GI has seen her and they are planning to advance her diet.  Will defer to them.  We will repeat LFTs in the morning, they are improving now.   DVT prophylaxis: enoxaparin (LOVENOX) injection 40 mg Start: 01/01/22 1600   Code Status: Full Code  Family Communication: NONE present at bedside.  Plan of care discussed with patient in length and he/she verbalized understanding and agreed with it.  Status is: Inpatient Remains inpatient appropriate because: Patient symptomatic with pain and intermittent  nausea.     Estimated body mass index is 25.85 kg/m as calculated from the following:   Height as of this encounter: 4\' 11"  (1.499 m).   Weight as of this encounter: 58.1 kg.    Nutritional Assessment: Body mass index is 25.85 kg/m. Seen by dietician.  I agree with the assessment and plan as outlined below: Nutrition Status:        . Skin Assessment: I have examined the patient's skin and I agree with the wound assessment as performed by the wound care RN as outlined below:    Consultants:  GI and general surgery  Procedures:  As above  Antimicrobials:  Anti-infectives (From admission, onward)    Start     Dose/Rate Route Frequency Ordered Stop   01/01/22 1249  ceFAZolin (ANCEF) 2-4 GM/100ML-% IVPB       Note to Pharmacy: 13/10/23 L: cabinet override      01/01/22 1249 01/02/22 0059   01/01/22 0600  piperacillin-tazobactam (ZOSYN) IVPB 3.375 g  Status:  Discontinued        3.375 g 12.5 mL/hr over 240 Minutes Intravenous Every 8 hours 01/01/22 0542 01/01/22 1524   12/31/21 2230  piperacillin-tazobactam (ZOSYN) IVPB 3.375 g        3.375 g 100 mL/hr over 30 Minutes Intravenous  Once 12/31/21 2228 12/31/21 2345         Subjective:  Patient seen and examined.  She states that overall she is feeling better than yesterday, her pain is better than yesterday.  Nausea is better as well.  No other complaint.  Objective: Vitals:   01/03/22 1352 01/03/22 1845 01/03/22 2031 01/04/22 0510  BP: (!) 143/88  122/75 (!) 111/51  Pulse: 71  87 61  Resp: 17  18 20   Temp: 97.9 F (36.6 C) 98.2 F (36.8 C) 98.4 F (36.9 C) 97.6 F (36.4 C)  TempSrc: Oral  Oral Oral  SpO2: 100%  100% 99%  Weight:      Height:        Intake/Output Summary (Last 24 hours) at 01/04/2022 1206 Last data filed at 01/04/2022 1000 Gross per 24 hour  Intake 1551.67 ml  Output --  Net 1551.67 ml    Filed Weights   12/31/21 2011  Weight: 58.1 kg    Examination:  General exam:  Appears calm and comfortable  Respiratory system: Clear to auscultation. Respiratory effort normal. Cardiovascular system: S1 & S2 heard, RRR. No JVD, murmurs, rubs, gallops or clicks. No pedal edema. Gastrointestinal system: Abdomen is nondistended, soft and epigastric and right upper quadrant tenderness. No organomegaly or masses felt. Normal bowel sounds heard. Central nervous system: Alert and oriented. No focal neurological deficits. Extremities: Symmetric 5 x 5 power. Skin: No rashes, lesions or ulcers.  Psychiatry: Judgement and insight appear normal. Mood & affect appropriate.   Data Reviewed: I have personally reviewed following labs and imaging studies  CBC: Recent Labs  Lab 12/31/21 2026 01/01/22 0544 01/02/22 0419 01/03/22 0343 01/04/22 0828  WBC 10.3 8.4 8.7 8.9 5.7  NEUTROABS  --   --  7.1  --  3.2  HGB 13.9 12.1 12.2 11.2* 12.0  HCT 40.7 34.5* 35.9* 32.5* 35.7*  MCV 82.4 83.1 84.7 84.2 85.0  PLT 275 213 206 196 199    Basic Metabolic Panel: Recent Labs  Lab 12/31/21 2026 01/01/22 0544 01/02/22 0419 01/03/22 0343 01/04/22 0828  NA 136 138 137 138 135  K 3.9 3.5 3.8 3.5 3.7  CL 100 109 106 104 100  CO2 25 25 25 25 26   GLUCOSE 104* 99 109* 123* 91  BUN 13 10 5* 7 <5*  CREATININE 0.75 0.71 0.66 0.62 0.73  CALCIUM 9.0 8.0* 8.8* 8.4* 8.5*    GFR: Estimated Creatinine Clearance: 82.8 mL/min (by C-G formula based on SCr of 0.73 mg/dL). Liver Function Tests: Recent Labs  Lab 12/31/21 2026 01/01/22 0544 01/02/22 0419 01/03/22 0343 01/04/22 0828  AST 102* 79* 266* 583* 180*  ALT 262* 205* 371* 510* 479*  ALKPHOS 99 84 93 102 115  BILITOT 1.0 1.1 1.3* 1.9* 1.9*  PROT 7.8 6.1* 7.0 6.6 6.9  ALBUMIN 4.3 3.3* 3.7 3.7 3.6    Recent Labs  Lab 12/31/21 2026 01/02/22 0419 01/03/22 0343  LIPASE 36 25 123*    No results for input(s): "AMMONIA" in the last 168 hours. Coagulation Profile: No results for input(s): "INR", "PROTIME" in the last 168  hours. Cardiac Enzymes: No results for input(s): "CKTOTAL", "CKMB", "CKMBINDEX", "TROPONINI" in the last 168 hours. BNP (last 3 results) No results for input(s): "PROBNP" in the last 8760 hours. HbA1C: No results for input(s): "HGBA1C" in the last 72 hours. CBG: No results for input(s): "GLUCAP" in the last 168 hours. Lipid Profile: No results for input(s): "CHOL", "HDL", "LDLCALC", "TRIG", "CHOLHDL", "LDLDIRECT" in the last 72 hours. Thyroid Function Tests: No results for input(s): "TSH", "T4TOTAL", "FREET4", "T3FREE", "THYROIDAB" in the last 72 hours. Anemia Panel: No results for input(s): "VITAMINB12", "FOLATE", "FERRITIN", "TIBC", "IRON", "RETICCTPCT" in the last 72 hours. Sepsis Labs: No results for input(s): "PROCALCITON", "LATICACIDVEN" in the last 168 hours.  Recent Results (from the past 240 hour(s))  Surgical pcr screen     Status: None   Collection Time: 01/01/22  8:36  AM   Specimen: Nasal Mucosa; Nasal Swab  Result Value Ref Range Status   MRSA, PCR NEGATIVE NEGATIVE Final   Staphylococcus aureus NEGATIVE NEGATIVE Final    Comment: (NOTE) The Xpert SA Assay (FDA approved for NASAL specimens in patients 58 years of age and older), is one component of a comprehensive surveillance program. It is not intended to diagnose infection nor to guide or monitor treatment. Performed at [facility], [address] [place], [place] [address]      Radiology Studies: No results found.  Scheduled Meds:  enoxaparin (LOVENOX) injection  40 mg Subcutaneous Q24H   Continuous Infusions:  lactated ringers 100 mL/hr at 01/03/22 1953     LOS: 3 days   Hughie Closs, MD [facility]  01/04/2022, 12:06 PM   *Please note that this is a verbal dictation therefore any spelling or grammatical errors are due to the "Dragon [employer] One" system interpretation.  Please page via Amion and do not message via secure chat for urgent patient care matters. Secure  chat can be used for non urgent patient care matters.  How to contact the [facility] Attending or Consulting provider 7A - 7P or covering provider during after hours 7P -7A, for this patient?  Check the care team in [facility] and look for a) attending/consulting [facility] provider listed and b) the [facility] team listed. Page or secure chat 7A-7P. Log into [email or web address] and use [facility] universal password to access. If you do not have the password, please contact the hospital operator. Locate the [facility] provider you are looking for under [facility] and page to a number that you can be directly reached. If you still have difficulty reaching the provider, please page the [facility] (Director on Call) for the Hospitalists listed on amion for assistance.

## 2022-01-04 NOTE — Progress Notes (Signed)
[facility] Gastroenterology Progress Note  Jamie Hansen 26 y.o. [birth year]  CC: Post ERCP pancreatitis   Subjective: Patient states her pain is similar to was yesterday.  Pain is worse with walking around or movement.  Did trial of clear liquids yesterday which elicited nausea.  Has gone back to ice chips  ROS : Review of Systems  Constitutional:  Negative for chills, fever and weight loss.  Gastrointestinal:  Positive for abdominal pain and nausea. Negative for blood in stool, constipation, diarrhea, heartburn, melena and vomiting.      Objective: Vital signs in last 24 hours: Vitals:   01/03/22 2031 01/04/22 0510  BP: 122/75 (!) 111/51  Pulse: 87 61  Resp: 18 20  Temp: 98.4 F (36.9 C) 97.6 F (36.4 C)  SpO2: 100% 99%    Physical Exam:  General:  Alert, cooperative, no distress, appears stated age  Head:  Normocephalic, without obvious abnormality, atraumatic  Eyes:  Anicteric sclera, EOM's intact  Lungs:   Clear to auscultation bilaterally, respirations unlabored  Heart:  Regular rate and rhythm, S1, S2 normal  Abdomen:   Soft, generalized tenderness, bowel sounds active all four quadrants,  no masses,   Extremities: Extremities normal, atraumatic, no  edema  Pulses: 2+ and symmetric    Lab Results: Recent Labs    01/03/22 0343 01/04/22 0828  NA 138 135  K 3.5 3.7  CL 104 100  CO2 25 26  GLUCOSE 123* 91  BUN 7 <5*  CREATININE 0.62 0.73  CALCIUM 8.4* 8.5*   Recent Labs    01/03/22 0343 01/04/22 0828  AST 583* 180*  ALT 510* 479*  ALKPHOS 102 115  BILITOT 1.9* 1.9*  PROT 6.6 6.9  ALBUMIN 3.7 3.6   Recent Labs    01/02/22 0419 01/03/22 0343 01/04/22 0828  WBC 8.7 8.9 5.7  NEUTROABS 7.1  --  3.2  HGB 12.2 11.2* 12.0  HCT 35.9* 32.5* 35.7*  MCV 84.7 84.2 85.0  PLT 206 196 199   No results for input(s): "LABPROT", "INR" in the last 72 hours.    Assessment Post ERCP pancreatitis -S/p lap chole with positive IOC 11/10 and s/p ERCP  with sphincterotomy 11/11 with 1 stone removed from CBD. -No leukocytosis -AST 180, ALT 479, T. bili 1.9, improving   Plan: Continue diet as tolerated, can retry clear liquids if she is ready Continue pain management [facility] will follow  Legrand Como PA-C 01/04/2022, 10:46 AM  Contact #  [phone]

## 2022-01-04 NOTE — Progress Notes (Signed)
Pt. Transferred to the unit via wheelchair accompanied by primary RN.Pt is alert and oriented, no  distress. Pt. Oriented to the room, use of call bells and needs attended to.

## 2022-01-04 NOTE — Progress Notes (Signed)
[facility] Progress Note  2 Days Post-Op  Subjective: CC-  Feeling better than yesterday. Minimal abdominal pain at rest, still has pain with movement. Pain is epigastric. Nausea at times, no emesis. Starting to feel hungry. LFTs trending down.  Objective: Vital signs in last 24 hours: Temp:  [97.6 F (36.4 C)-98.4 F (36.9 C)] 97.6 F (36.4 C) (11/13 0510) Pulse Rate:  [61-87] 61 (11/13 0510) Resp:  [17-20] 20 (11/13 0510) BP: (111-143)/(51-88) 111/51 (11/13 0510) SpO2:  [99 %-100 %] 99 % (11/13 0510) Last BM Date : 12/31/21  Intake/Output from previous day: 11/12 0701 - 11/13 0700 In: 2491.7 [P.O.:1080; I.V.:1411.7] Out: -  Intake/Output this shift: No intake/output data recorded.  PE: Gen:  Alert, NAD, pleasant Abd: soft, ND, TTP epigastric region. Lap incisions cdi, periumbilical incision with some ecchymosis, no signs of infection  Lab Results:  Recent Labs    01/03/22 0343 01/04/22 0828  WBC 8.9 5.7  HGB 11.2* 12.0  HCT 32.5* 35.7*  PLT 196 199   BMET Recent Labs    01/03/22 0343 01/04/22 0828  NA 138 135  K 3.5 3.7  CL 104 100  CO2 25 26  GLUCOSE 123* 91  BUN 7 <5*  CREATININE 0.62 0.73  CALCIUM 8.4* 8.5*   PT/INR No results for input(s): "LABPROT", "INR" in the last 72 hours. CMP     Component Value Date/Time   NA 135 01/04/2022 0828   K 3.7 01/04/2022 0828   CL 100 01/04/2022 0828   CO2 26 01/04/2022 0828   GLUCOSE 91 01/04/2022 0828   BUN <5 (L) 01/04/2022 0828   CREATININE 0.73 01/04/2022 0828   CALCIUM 8.5 (L) 01/04/2022 0828   PROT 6.9 01/04/2022 0828   ALBUMIN 3.6 01/04/2022 0828   AST 180 (H) 01/04/2022 0828   ALT 479 (H) 01/04/2022 0828   ALKPHOS 115 01/04/2022 0828   BILITOT 1.9 (H) 01/04/2022 0828   GFRNONAA >60 01/04/2022 0828   GFRAA >60 09/27/2017 2033   Lipase     Component Value Date/Time   LIPASE 123 (H) 01/03/2022 0343       Studies/Results: DG ERCP  Result Date: 01/02/2022 CLINICAL DATA:   Intraoperative cholangiogram demonstrated evidence choledocholithiasis. EXAM: ERCP TECHNIQUE: Multiple spot images obtained with the fluoroscopic device and submitted for interpretation post-procedure. COMPARISON:  Intraoperative cholangiogram on 01/01/2022 FINDINGS: Imaging during ERCP demonstrates cannulation of the common bile duct with contrast injection demonstrating at least a single filling defect within the mid to distal common bile duct. Balloon sweep maneuver was performed to extract calculi. IMPRESSION: ERCP demonstrating evidence of choledocholithiasis with at least a single filling defect in the common bile duct. Balloon sweep maneuver was performed to extract calculi. Electronically Signed   By: Irish Lack M.D.   On: 01/02/2022 11:07    Anti-infectives: Anti-infectives (From admission, onward)    Start     Dose/Rate Route Frequency Ordered Stop   01/01/22 1249  ceFAZolin (ANCEF) 2-4 GM/100ML-% IVPB       Note to Pharmacy: Leroy Libman L: cabinet override      01/01/22 1249 01/02/22 0059   01/01/22 0600  piperacillin-tazobactam (ZOSYN) IVPB 3.375 g  Status:  Discontinued        3.375 g 12.5 mL/hr over 240 Minutes Intravenous Every 8 hours 01/01/22 0542 01/01/22 1524   12/31/21 2230  piperacillin-tazobactam (ZOSYN) IVPB 3.375 g        3.375 g 100 mL/hr over 30 Minutes Intravenous  Once 12/31/21 2228 12/31/21  2345        Assessment/Plan Cholecystitis, Choledocholithiasis POD#3 s/p laparoscopic cholecystectomy 11/10 Dr. Fredricka Bonine - IOC positive - s/p ERCP 11/11 - Doing well from surgery. Discharge instructions and follow up info on AVS. I will send rx for pain medication and write her a work note. We will be available PRN. Post-ERCP pancreatitis - per GI. I think she could try clear liquids, but will leave this to GI  ID - none currently FEN - IVF, NPO VTE - lovenox Foley - none    LOS: 3 days    Franne Forts, [facility] 01/04/2022, 10:02  AM Please see Amion for pager number during day hours 7:00am-4:30pm

## 2022-01-05 LAB — CBC WITH DIFFERENTIAL/PLATELET
Abs Immature Granulocytes: 0.04 10*3/uL (ref 0.00–0.07)
Basophils Absolute: 0 10*3/uL (ref 0.0–0.1)
Basophils Relative: 0 %
Eosinophils Absolute: 0.3 10*3/uL (ref 0.0–0.5)
Eosinophils Relative: 4 %
HCT: 37.3 % (ref 36.0–46.0)
Hemoglobin: 12.7 g/dL (ref 12.0–15.0)
Immature Granulocytes: 1 %
Lymphocytes Relative: 40 %
Lymphs Abs: 2.8 10*3/uL (ref 0.7–4.0)
MCH: 28.7 pg (ref 26.0–34.0)
MCHC: 34 g/dL (ref 30.0–36.0)
MCV: 84.2 fL (ref 80.0–100.0)
Monocytes Absolute: 0.4 10*3/uL (ref 0.1–1.0)
Monocytes Relative: 6 %
Neutro Abs: 3.5 10*3/uL (ref 1.7–7.7)
Neutrophils Relative %: 49 %
Platelets: 225 10*3/uL (ref 150–400)
RBC: 4.43 MIL/uL (ref 3.87–5.11)
RDW: 13.8 % (ref 11.5–15.5)
WBC: 7.1 10*3/uL (ref 4.0–10.5)
nRBC: 0 % (ref 0.0–0.2)

## 2022-01-05 LAB — COMPREHENSIVE METABOLIC PANEL
ALT: 342 U/L — ABNORMAL HIGH (ref 0–44)
AST: 77 U/L — ABNORMAL HIGH (ref 15–41)
Albumin: 3.4 g/dL — ABNORMAL LOW (ref 3.5–5.0)
Alkaline Phosphatase: 105 U/L (ref 38–126)
Anion gap: 8 (ref 5–15)
BUN: 10 mg/dL (ref 6–20)
CO2: 25 mmol/L (ref 22–32)
Calcium: 8.7 mg/dL — ABNORMAL LOW (ref 8.9–10.3)
Chloride: 103 mmol/L (ref 98–111)
Creatinine, Ser: 0.63 mg/dL (ref 0.44–1.00)
GFR, Estimated: >60 mL/min (ref >60)
Glucose, Bld: 100 mg/dL — ABNORMAL HIGH (ref 70–99)
Potassium: 3.9 mmol/L (ref 3.5–5.1)
Sodium: 136 mmol/L (ref 135–145)
Total Bilirubin: 1 mg/dL (ref 0.3–1.2)
Total Protein: 6.4 g/dL — ABNORMAL LOW (ref 6.5–8.1)

## 2022-01-05 NOTE — Progress Notes (Signed)
Pt. Discharge ambulatory, alert and oriented no distress. Discharge instruction and education discussed, patient verbalized understanding. All personal belongings are with the patient.

## 2022-01-05 NOTE — Progress Notes (Signed)
Mobility Specialist - Progress Note   01/05/22 0915  Mobility  Activity Ambulated independently in hallway  Level of Assistance Independent after set-up  Assistive Device None  Distance Ambulated (ft) 500 ft  Activity Response Tolerated well  Mobility Referral Yes  $Mobility charge 1 Mobility   Pt received in bed and agreed to mobility. Some pain during session in LRQ. Pt returned to bed with all needs met.   Roderick Pee Mobility Specialist

## 2022-01-05 NOTE — Progress Notes (Addendum)
[facility] Gastroenterology Progress Note  Jamie Hansen 25 y.o. 06-19-1995  CC: Post ERCP pancreatitis   Subjective: Patient states she is doing well.  Pain has improved.  Tolerating clear liquid diet without difficulty and would like to move on to regular diet.  Ready to go home.  ROS : Review of Systems  Constitutional:  Negative for chills, fever and weight loss.  Gastrointestinal:  Negative for abdominal pain, blood in stool, constipation, diarrhea, heartburn, melena, nausea and vomiting.      Objective: Vital signs in last 24 hours: Vitals:   01/04/22 2359 01/05/22 0616  BP: 113/66 131/73  Pulse: 66 64  Resp: 16 20  Temp: 98.9 F (37.2 C) 98.4 F (36.9 C)  SpO2: 99% 100%    Physical Exam:  General:  Alert, cooperative, no distress, appears stated age  Head:  Normocephalic, without obvious abnormality, atraumatic  Eyes:  Anicteric sclera, EOM's intact  Lungs:   Clear to auscultation bilaterally, respirations unlabored  Heart:  Regular rate and rhythm, S1, S2 normal  Abdomen:   Soft, non-tender, bowel sounds active all four quadrants,  no masses,   Extremities: Extremities normal, atraumatic, no  edema  Pulses: 2+ and symmetric    Lab Results: Recent Labs    01/04/22 0828 01/05/22 0508  NA 135 136  K 3.7 3.9  CL 100 103  CO2 26 25  GLUCOSE 91 100*  BUN <5* 10  CREATININE 0.73 0.63  CALCIUM 8.5* 8.7*   Recent Labs    01/04/22 0828 01/05/22 0508  AST 180* 77*  ALT 479* 342*  ALKPHOS 115 105  BILITOT 1.9* 1.0  PROT 6.9 6.4*  ALBUMIN 3.6 3.4*   Recent Labs    01/04/22 0828 01/05/22 0508  WBC 5.7 7.1  NEUTROABS 3.2 3.5  HGB 12.0 12.7  HCT 35.7* 37.3  MCV 85.0 84.2  PLT 199 225   No results for input(s): "LABPROT", "INR" in the last 72 hours.    Assessment Post ERCP pancreatitis -S/p lap chole with positive IOC 11/10 and s/p ERCP with sphincterotomy 11/11 with 1 stone removed from CBD. -No leukocytosis - LFTs  improving   Plan: Can have regular diet LFTs improving, patient can be discharged from GI standpoint with follow-up with Dr. Bosie Clos or PA [facility] will sign off. Please contact us if we can be of any further assistance during this hospital stay.   Legrand Como PA-C 01/05/2022, 10:58 AM  Contact #  [phone]

## 2022-01-05 NOTE — Discharge Summary (Signed)
Physician Discharge Summary  Jamie Hansen [id number] DOB: 07-19-1995 DOA: 12/31/2021  PCP: Patient, No Pcp Per  Admit date: 12/31/2021 Discharge date: 01/05/2022 30 Day Unplanned Readmission Risk Score    Flowsheet Row ED to Hosp-Admission (Current) from 12/31/2021 in [facility]  30 Day Unplanned Readmission Risk Score (%) 6.4 Filed at 01/05/2022 0801       This score is the patient's risk of an unplanned readmission within 30 days of being discharged (0 -100%). The score is based on dignosis, age, lab data, medications, orders, and past utilization.   Low:  0-14.9   Medium: 15-21.9   High: 22-29.9   Extreme: 30 and above          Admitted From: Home Disposition: Home  Recommendations for Outpatient Follow-up:  Follow up with PCP in 1-2 weeks Please obtain BMP/CBC in one week Follow-up with GI or general surgery if needed. Please follow up with your PCP on the following pending results: Unresulted Labs (From admission, onward)     Start     Ordered   01/04/22 0804  CBC with Differential/Platelet  Daily at 5am,   R      01/04/22 [place]: None Equipment/Devices: None  Discharge Condition: Stable CODE STATUS: Full code Diet recommendation: Regular  Subjective: Patient seen and examined.  She says that her abdominal pain has improved but she still has some, she is tolerating full liquid diet and she had requested GI to advance to regular diet as she thinks she is ready for late, she has no nausea.  She also wants to go home today.  Brief/Interim Summary: Jamie Hansen is a 26 y.o. female with no past medical history presented with right upper quadrant, pain for 48 hours, she was initially seen at outside hospital and had ultrasound then was discharged home with a diagnosis of gastritis. she returned to [facility] and this time she was diagnosed with acute acalculus cholecystitis and  admitted to hospital service, general surgery was consulted.  She underwent laparoscopic cholecystectomy by general surgery on 07/03/2019, was found to have distal CBD stone on intraoperative cholangiogram, GI was consulted, she underwent ERCP 01/02/2022.  She then developed post ERCP pancreatitis with elevated LFTs and lipase on 01/11/2022.  She was backed down to n.p.o. with ice chips.  He felt better yesterday, she was started on clears, LFTs continues to improve.  She is feeling better today, has been advised to regular diet by GI and they have cleared her for discharge today.  General surgery has signed off already.  Patient would like to go home today.  She is a stable.  Discharge Diagnoses:  Principal Problem:   Acalculous cholecystitis Active Problems:   History of laparoscopic cholecystectomy   Choledocholithiasis with acute cholecystitis   Acute cholecystitis   Post-ERCP acute pancreatitis    Discharge Instructions   Allergies as of 01/05/2022       Reactions   Latex Itching, Other (See Comments)   Itching and yeast like symptoms with latex condom use.        Medication List     TAKE these medications    multivitamin with minerals tablet Take 1 tablet by mouth daily.   omeprazole 40 MG capsule Commonly known as: PRILOSEC Take 1 capsule by mouth daily.   ondansetron 4 MG disintegrating tablet Commonly known as: ZOFRAN-ODT Take 1 tablet (4 mg total) by  mouth every 8 (eight) hours as needed for nausea or vomiting.   oxyCODONE 5 MG immediate release tablet Commonly known as: Oxy IR/ROXICODONE Take 1 tablet (5 mg total) by mouth every 6 (six) hours as needed for severe pain.        Follow-up Information     Maczis, Carlena Hurl, [place]. Go on 01/22/2022.   Specialty: General Surgery Why: follow up on 01/22/22 at 9 am., Please arrive 30 minutes early to complete check in, and bring photo ID and insurance card. Contact information: [place] [id number] [phone]         PCP Follow up in 1 week(s).                 Allergies  Allergen Reactions   Latex Itching and Other (See Comments)    Itching and yeast like symptoms with latex condom use.    Consultations: GI and general surgery   Procedures/Studies: DG ERCP  Result Date: 01/02/2022 CLINICAL DATA:  Intraoperative cholangiogram demonstrated evidence choledocholithiasis. EXAM: ERCP TECHNIQUE: Multiple spot images obtained with the fluoroscopic device and submitted for interpretation post-procedure. COMPARISON:  Intraoperative cholangiogram on 01/01/2022 FINDINGS: Imaging during ERCP demonstrates cannulation of the common bile duct with contrast injection demonstrating at least a single filling defect within the mid to distal common bile duct. Balloon sweep maneuver was performed to extract calculi. IMPRESSION: ERCP demonstrating evidence of choledocholithiasis with at least a single filling defect in the common bile duct. Balloon sweep maneuver was performed to extract calculi. Electronically Signed   By: Aletta Edouard M.D.   On: 01/02/2022 11:07   DG Cholangiogram Operative  Result Date: 01/01/2022 CLINICAL DATA:  Acute cholecystitis EXAM: INTRAOPERATIVE CHOLANGIOGRAM TECHNIQUE: Cholangiographic images from the C-arm fluoroscopic device were submitted for interpretation post-operatively. Please see the procedural report for the amount of contrast and the fluoroscopy time utilized. FLUOROSCOPY: Radiation Exposure Index (as provided by the fluoroscopic device): 3.64 mGy reference air kerma COMPARISON:  CT abdomen/pelvis 12/31/2021; abdominal ultrasound 12/31/2021 FINDINGS: A cine clip obtained during intraoperative cholangiogram demonstrates cannulation of the cystic duct remanent and opacification of the biliary tree. There is a focal rounded filling defect within the distal common bile duct which does not appear obstructive  but remains concerning for choledocholithiasis. Contrast material passes through the ampulla and into the duodenum. IMPRESSION: Nonobstructing choledocholithiasis with a single rounded filling defect in the distal common bile duct. Electronically Signed   By: Jacqulynn Cadet M.D.   On: 01/01/2022 15:33   US Abdomen Limited RUQ (LIVER/GB)  Result Date: 12/31/2021 CLINICAL DATA:  Right upper quadrant abdominal pain EXAM: ULTRASOUND ABDOMEN LIMITED RIGHT UPPER QUADRANT COMPARISON:  CT 9:56 p.m. FINDINGS: Gallbladder: The gallbladder is distended there is mild gallbladder wall thickening noted, and mild pericholecystic fluid is identified. No intraluminal stones or sludge is identified. The sonographic Percell Miller sign is reportedly negative. Common bile duct: Diameter: 5-6 mm Liver: No focal lesion identified. Within normal limits in parenchymal echogenicity. Portal vein is patent on color Doppler imaging with normal direction of blood flow towards the liver. Other: None. IMPRESSION: 1. Distended gallbladder with mild gallbladder wall thickening and pericholecystic fluid. The findings can be seen in the setting of acute acalculous cholecystitis, though additional considerations should include inflammatory conditions of the liver or biliary tree. Correlation with liver enzymes may be helpful. Hepatic biliary scintigraphy may also be helpful to assess patency of the cystic duct. Electronically Signed   By: Cassandria Anger  Christa See M.D.   On: 12/31/2021 23:28   CT ABDOMEN PELVIS W CONTRAST  Result Date: 12/31/2021 CLINICAL DATA:  Right upper quadrant abdominal pain EXAM: CT ABDOMEN AND PELVIS WITH CONTRAST TECHNIQUE: Multidetector CT imaging of the abdomen and pelvis was performed using the standard protocol following bolus administration of intravenous contrast. RADIATION DOSE REDUCTION: This exam was performed according to the departmental dose-optimization program which includes automated exposure control, adjustment of the  mA and/or kV according to patient size and/or use of iterative reconstruction technique. CONTRAST:  168mL OMNIPAQUE IOHEXOL 300 MG/ML  SOLN COMPARISON:  None Available. FINDINGS: Lower chest: No acute abnormality. Hepatobiliary: No suspicious focal liver abnormality is seen. Gallbladder wall thickening and intrahepatic biliary ductal dilation. Mild prominence of the common bile duct measuring 6 mm in diameter. No definite obstructing stone. Pancreas: Unremarkable. No pancreatic ductal dilatation or surrounding inflammatory changes. Spleen: Normal in size without focal abnormality. Adrenals/Urinary Tract: Adrenal glands are unremarkable. Kidneys are normal, without renal calculi, suspicious focal lesion, or hydronephrosis. Bladder is unremarkable. Stomach/Bowel: Stomach is within normal limits. No evidence of bowel wall thickening, distention, or inflammatory changes. The appendix is normal. Moderate colonic stool load. Fecalization of the terminal ileum. Vascular/Lymphatic: No significant vascular findings are present. No enlarged abdominal or pelvic lymph nodes. Reproductive: Tubal ligation. Unremarkable appearance of the right ovary and uterus. Mass within the left ovary with fat density compatible with dermoid measuring 3.0 cm. This has increased in size since ultrasound 07/11/2016. Other: Small amount of free fluid in the pelvis, likely physiologic. No free intraperitoneal air. Musculoskeletal: No acute or significant osseous findings. IMPRESSION: Findings suggestive of acute cholecystitis. No definite cholelithiasis. Fat density mass in the right ovary consistent with dermoid cyst, increased since 07/11/2016. Moderate fecal burden with fecalization of the terminal ileum. Recommend correlation for constipation. Electronically Signed   By: Placido Sou M.D.   On: 12/31/2021 22:19     Discharge Exam: Vitals:   01/04/22 2359 01/05/22 0616  BP: 113/66 131/73  Pulse: 66 64  Resp: 16 20  Temp: 98.9 F  (37.2 C) 98.4 F (36.9 C)  SpO2: 99% 100%   Vitals:   01/04/22 1310 01/04/22 1801 01/04/22 2359 01/05/22 0616  BP: (!) 132/90 120/82 113/66 131/73  Pulse: 77 64 66 64  Resp: 14 18 16 20   Temp: 98.6 F (37 C) 98.5 F (36.9 C) 98.9 F (37.2 C) 98.4 F (36.9 C)  TempSrc:  Oral Oral Oral  SpO2: 99% 100% 99% 100%  Weight:      Height:        General: Pt is alert, awake, not in acute distress Cardiovascular: RRR, S1/S2 +, no rubs, no gallops Respiratory: CTA bilaterally, no wheezing, no rhonchi Abdominal: Soft, mild to moderate right upper quadrant and epigastric tenderness, ND, bowel sounds + Extremities: no edema, no cyanosis    The results of significant diagnostics from this hospitalization (including imaging, microbiology, ancillary and laboratory) are listed below for reference.     Microbiology: Recent Results (from the past 240 hour(s))  Surgical pcr screen     Status: None   Collection Time: 01/01/22  8:36 AM   Specimen: Nasal Mucosa; Nasal Swab  Result Value Ref Range Status   MRSA, PCR NEGATIVE NEGATIVE Final   Staphylococcus aureus NEGATIVE NEGATIVE Final    Comment: (NOTE) The Xpert SA Assay (FDA approved for NASAL specimens in patients 31 years of age and older), is one component of a comprehensive surveillance program. It is not intended to  diagnose infection nor to guide or monitor treatment. Performed at [facility], [place] [address] [place], [place] [address]      Labs: BNP (last 3 results) No results for input(s): "BNP" in the last 8760 hours. Basic Metabolic Panel: Recent Labs  Lab 01/01/22 0544 01/02/22 0419 01/03/22 0343 01/04/22 0828 01/05/22 0508  NA 138 137 138 135 136  K 3.5 3.8 3.5 3.7 3.9  CL 109 106 104 100 103  CO2 25 25 25 26 25   GLUCOSE 99 109* 123* 91 100*  BUN 10 5* 7 <5* 10  CREATININE 0.71 0.66 0.62 0.73 0.63  CALCIUM 8.0* 8.8* 8.4* 8.5* 8.7*   Liver Function Tests: Recent Labs  Lab 01/01/22 0544  01/02/22 0419 01/03/22 0343 01/04/22 0828 01/05/22 0508  AST 79* 266* 583* 180* 77*  ALT 205* 371* 510* 479* 342*  ALKPHOS 84 93 102 115 105  BILITOT 1.1 1.3* 1.9* 1.9* 1.0  PROT 6.1* 7.0 6.6 6.9 6.4*  ALBUMIN 3.3* 3.7 3.7 3.6 3.4*   Recent Labs  Lab 12/31/21 2026 01/02/22 0419 01/03/22 0343  LIPASE 36 25 123*   No results for input(s): "AMMONIA" in the last 168 hours. CBC: Recent Labs  Lab 01/01/22 0544 01/02/22 0419 01/03/22 0343 01/04/22 0828 01/05/22 0508  WBC 8.4 8.7 8.9 5.7 7.1  NEUTROABS  --  7.1  --  3.2 3.5  HGB 12.1 12.2 11.2* 12.0 12.7  HCT 34.5* 35.9* 32.5* 35.7* 37.3  MCV 83.1 84.7 84.2 85.0 84.2  PLT 213 206 196 199 225   Cardiac Enzymes: No results for input(s): "CKTOTAL", "CKMB", "CKMBINDEX", "TROPONINI" in the last 168 hours. BNP: Invalid input(s): "POCBNP" CBG: No results for input(s): "GLUCAP" in the last 168 hours. D-Dimer No results for input(s): "DDIMER" in the last 72 hours. Hgb A1c No results for input(s): "HGBA1C" in the last 72 hours. Lipid Profile No results for input(s): "CHOL", "HDL", "LDLCALC", "TRIG", "CHOLHDL", "LDLDIRECT" in the last 72 hours. Thyroid function studies No results for input(s): "TSH", "T4TOTAL", "T3FREE", "THYROIDAB" in the last 72 hours.  Invalid input(s): "FREET3" Anemia work up No results for input(s): "VITAMINB12", "FOLATE", "FERRITIN", "TIBC", "IRON", "RETICCTPCT" in the last 72 hours. Urinalysis    Component Value Date/Time   COLORURINE YELLOW 12/31/2021 2121   APPEARANCEUR CLEAR 12/31/2021 2121   LABSPEC 1.020 12/31/2021 2121   PHURINE 5.5 12/31/2021 2121   GLUCOSEU NEGATIVE 12/31/2021 2121   HGBUR TRACE (A) 12/31/2021 2121   BILIRUBINUR NEGATIVE 12/31/2021 2121   KETONESUR NEGATIVE 12/31/2021 2121   PROTEINUR NEGATIVE 12/31/2021 2121   UROBILINOGEN 0.2 08/07/2014 1010   NITRITE NEGATIVE 12/31/2021 2121   LEUKOCYTESUR NEGATIVE 12/31/2021 2121   Sepsis Labs Recent Labs  Lab 01/02/22 0419  01/03/22 0343 01/04/22 0828 01/05/22 0508  WBC 8.7 8.9 5.7 7.1   Microbiology Recent Results (from the past 240 hour(s))  Surgical pcr screen     Status: None   Collection Time: 01/01/22  8:36 AM   Specimen: Nasal Mucosa; Nasal Swab  Result Value Ref Range Status   MRSA, PCR NEGATIVE NEGATIVE Final   Staphylococcus aureus NEGATIVE NEGATIVE Final    Comment: (NOTE) The Xpert SA Assay (FDA approved for NASAL specimens in patients 11 years of age and older), is one component of a comprehensive surveillance program. It is not intended to diagnose infection nor to guide or monitor treatment. Performed at [facility], [place] [address] [place], [place] [address]      Time coordinating discharge: Over 79  minutes  SIGNED:   Hughie Closs, MD  [facility] 01/05/2022, 11:29 AM *Please note that this is a verbal dictation therefore any spelling or grammatical errors are due to the "Dragon [employer] One" system interpretation. If 7PM-7AM, please contact night-coverage [email or web address]

## 2022-01-05 NOTE — Plan of Care (Signed)

## 2022-02-22 ENCOUNTER — Encounter (HOSPITAL_COMMUNITY): Payer: Self-pay

## 2022-02-22 ENCOUNTER — Emergency Department (HOSPITAL_COMMUNITY): Payer: BC Managed Care – PPO

## 2022-02-22 ENCOUNTER — Other Ambulatory Visit: Payer: Self-pay

## 2022-02-22 ENCOUNTER — Inpatient Hospital Stay (HOSPITAL_COMMUNITY)
Admission: EM | Admit: 2022-02-22 | Discharge: 2022-02-25 | DRG: 440 | Disposition: A | Discharge status: Home / Self Care | Payer: BC Managed Care – PPO | Attending: Family Medicine | Admitting: Family Medicine

## 2022-02-22 DIAGNOSIS — E876 Hypokalemia: Secondary | ICD-10-CM | POA: Diagnosis present

## 2022-02-22 DIAGNOSIS — R1011 Right upper quadrant pain: Secondary | ICD-10-CM | POA: Diagnosis not present

## 2022-02-22 DIAGNOSIS — F319 Bipolar disorder, unspecified: Secondary | ICD-10-CM | POA: Diagnosis present

## 2022-02-22 DIAGNOSIS — K2970 Gastritis, unspecified, without bleeding: Secondary | ICD-10-CM | POA: Diagnosis present

## 2022-02-22 DIAGNOSIS — Z91040 Latex allergy status: Secondary | ICD-10-CM

## 2022-02-22 DIAGNOSIS — R7401 Elevation of levels of liver transaminase levels: Secondary | ICD-10-CM | POA: Diagnosis present

## 2022-02-22 DIAGNOSIS — Z9049 Acquired absence of other specified parts of digestive tract: Secondary | ICD-10-CM | POA: Diagnosis not present

## 2022-02-22 DIAGNOSIS — Z79899 Other long term (current) drug therapy: Secondary | ICD-10-CM

## 2022-02-22 DIAGNOSIS — Z23 Encounter for immunization: Secondary | ICD-10-CM

## 2022-02-22 DIAGNOSIS — N839 Noninflammatory disorder of ovary, fallopian tube and broad ligament, unspecified: Secondary | ICD-10-CM | POA: Diagnosis present

## 2022-02-22 DIAGNOSIS — R109 Unspecified abdominal pain: Secondary | ICD-10-CM

## 2022-02-22 DIAGNOSIS — Z87891 Personal history of nicotine dependence: Secondary | ICD-10-CM

## 2022-02-22 DIAGNOSIS — K838 Other specified diseases of biliary tract: Secondary | ICD-10-CM | POA: Diagnosis present

## 2022-02-22 DIAGNOSIS — D72829 Elevated white blood cell count, unspecified: Secondary | ICD-10-CM | POA: Diagnosis present

## 2022-02-22 DIAGNOSIS — F419 Anxiety disorder, unspecified: Secondary | ICD-10-CM | POA: Diagnosis present

## 2022-02-22 DIAGNOSIS — R17 Unspecified jaundice: Secondary | ICD-10-CM

## 2022-02-22 DIAGNOSIS — K859 Acute pancreatitis without necrosis or infection, unspecified: Secondary | ICD-10-CM | POA: Diagnosis not present

## 2022-02-22 DIAGNOSIS — K8050 Calculus of bile duct without cholangitis or cholecystitis without obstruction: Secondary | ICD-10-CM | POA: Diagnosis present

## 2022-02-22 LAB — COMPREHENSIVE METABOLIC PANEL
ALT: 63 U/L — ABNORMAL HIGH (ref 0–44)
AST: 42 U/L — ABNORMAL HIGH (ref 15–41)
Albumin: 4.1 g/dL (ref 3.5–5.0)
Alkaline Phosphatase: 66 U/L (ref 38–126)
Anion gap: 10 (ref 5–15)
BUN: 7 mg/dL (ref 6–20)
CO2: 23 mmol/L (ref 22–32)
Calcium: 8.7 mg/dL — ABNORMAL LOW (ref 8.9–10.3)
Chloride: 98 mmol/L (ref 98–111)
Creatinine, Ser: 0.74 mg/dL (ref 0.44–1.00)
GFR, Estimated: >60 mL/min (ref >60)
Glucose, Bld: 103 mg/dL — ABNORMAL HIGH (ref 70–99)
Potassium: 3.9 mmol/L (ref 3.5–5.1)
Sodium: 131 mmol/L — ABNORMAL LOW (ref 135–145)
Total Bilirubin: 2.6 mg/dL — ABNORMAL HIGH (ref 0.3–1.2)
Total Protein: 7.4 g/dL (ref 6.5–8.1)

## 2022-02-22 LAB — CBC WITH DIFFERENTIAL/PLATELET
Abs Immature Granulocytes: 0.11 10*3/uL — ABNORMAL HIGH (ref 0.00–0.07)
Basophils Absolute: 0 10*3/uL (ref 0.0–0.1)
Basophils Relative: 0 %
Eosinophils Absolute: 0.1 10*3/uL (ref 0.0–0.5)
Eosinophils Relative: 1 %
HCT: 40.5 % (ref 36.0–46.0)
Hemoglobin: 14.1 g/dL (ref 12.0–15.0)
Immature Granulocytes: 1 %
Lymphocytes Relative: 4 %
Lymphs Abs: 0.6 10*3/uL — ABNORMAL LOW (ref 0.7–4.0)
MCH: 29 pg (ref 26.0–34.0)
MCHC: 34.8 g/dL (ref 30.0–36.0)
MCV: 83.2 fL (ref 80.0–100.0)
Monocytes Absolute: 0.7 10*3/uL (ref 0.1–1.0)
Monocytes Relative: 4 %
Neutro Abs: 14.6 10*3/uL — ABNORMAL HIGH (ref 1.7–7.7)
Neutrophils Relative %: 90 %
Platelets: 191 10*3/uL (ref 150–400)
RBC: 4.87 MIL/uL (ref 3.87–5.11)
RDW: 13.8 % (ref 11.5–15.5)
WBC: 16.1 10*3/uL — ABNORMAL HIGH (ref 4.0–10.5)
nRBC: 0 % (ref 0.0–0.2)

## 2022-02-22 LAB — URINALYSIS, ROUTINE W REFLEX MICROSCOPIC
Bacteria, UA: NONE SEEN
Bilirubin Urine: NEGATIVE
Glucose, UA: NEGATIVE mg/dL
Ketones, ur: 20 mg/dL — AB
Leukocytes,Ua: NEGATIVE
Nitrite: NEGATIVE
Protein, ur: 30 mg/dL — AB
Specific Gravity, Urine: 1.012 (ref 1.005–1.030)
pH: 6 (ref 5.0–8.0)

## 2022-02-22 LAB — I-STAT BETA HCG BLOOD, ED (MC, WL, AP ONLY): I-stat hCG, quantitative: <5 m[IU]/mL (ref <5)

## 2022-02-22 LAB — APTT: aPTT: 35 seconds (ref 24–36)

## 2022-02-22 LAB — PROTIME-INR
INR: 1.3 — ABNORMAL HIGH (ref 0.8–1.2)
Prothrombin Time: 15.7 seconds — ABNORMAL HIGH (ref 11.4–15.2)

## 2022-02-22 LAB — PREGNANCY, URINE: Preg Test, Ur: NEGATIVE

## 2022-02-22 LAB — LACTIC ACID, PLASMA: Lactic Acid, Venous: 1 mmol/L (ref 0.5–1.9)

## 2022-02-22 LAB — LIPASE, BLOOD: Lipase: 30 U/L (ref 11–51)

## 2022-02-22 MED ORDER — ONDANSETRON HCL 4 MG/2ML IJ SOLN
4.0000 mg | Freq: Four times a day (QID) | INTRAMUSCULAR | Status: DC | PRN
  Administered 2022-02-23 (×2): 4 mg via INTRAVENOUS
  Filled 2022-02-22 (×2): qty 2

## 2022-02-22 MED ORDER — ONDANSETRON 4 MG PO TBDP
4.0000 mg | ORAL_TABLET | Freq: Once | ORAL | Status: AC
  Administered 2022-02-22: 4 mg via ORAL
  Filled 2022-02-22: qty 1

## 2022-02-22 MED ORDER — SODIUM CHLORIDE 0.9 % IV SOLN: INTRAVENOUS | Status: DC

## 2022-02-22 MED ORDER — IOHEXOL 300 MG/ML  SOLN
100.0000 mL | Freq: Once | INTRAMUSCULAR | Status: AC | PRN
  Administered 2022-02-22: 100 mL via INTRAVENOUS

## 2022-02-22 MED ORDER — MORPHINE SULFATE (PF) 2 MG/ML IV SOLN
1.0000 mg | INTRAVENOUS | Status: DC | PRN
  Administered 2022-02-23 – 2022-02-24 (×2): 1 mg via INTRAVENOUS
  Filled 2022-02-22 (×2): qty 1

## 2022-02-22 MED ORDER — SODIUM CHLORIDE (PF) 0.9 % IJ SOLN
INTRAMUSCULAR | Status: AC
  Filled 2022-02-22: qty 50

## 2022-02-22 MED ORDER — SODIUM CHLORIDE 0.9 % IV BOLUS
1000.0000 mL | Freq: Once | INTRAVENOUS | Status: AC
  Administered 2022-02-22: 1000 mL via INTRAVENOUS

## 2022-02-22 MED ORDER — MORPHINE SULFATE (PF) 4 MG/ML IV SOLN
4.0000 mg | Freq: Once | INTRAVENOUS | Status: AC
  Administered 2022-02-22: 4 mg via INTRAVENOUS
  Filled 2022-02-22: qty 1

## 2022-02-22 MED ORDER — HYDROMORPHONE HCL 1 MG/ML IJ SOLN
1.0000 mg | INTRAMUSCULAR | Status: DC | PRN
  Administered 2022-02-23 (×4): 1 mg via INTRAVENOUS
  Filled 2022-02-22 (×5): qty 1

## 2022-02-22 NOTE — ED Triage Notes (Signed)
Pt c/o abdominal pain and emesis starting yesterday. Pt states she had her gallbladder removed 01/01/22 and the pain feels like it did when she had her surgery. Pt states her last bowel movement was yesterday and denies urinary frequency/burning.

## 2022-02-22 NOTE — H&P (Signed)
History and Physical    Patient: Jamie Hansen IRJ:[id number] DOB: [birth year] DOA: 02/22/2022 DOS: the patient was seen and examined on 02/23/2022 PCP: Patient, No Pcp Per  Patient coming from: Home  Chief Complaint:  Chief Complaint  Patient presents with   Abdominal Pain   HPI: Jamie Hansen is a 27 y.o. female with medical history significant of bipolar disorder, anxiety, recent cholecystecomy, ERCP pancreatitis who presents with RUQ abdominal pain.   She was hospitalized from 12/31/2021-01/05/2022 and initially diagnosed with acute acalculus cholecystitis and underwent laproscopic cholecystectomy with general surgery but intraoperatively found to have distal CBD stone and underwent ERCP with GI on 01/02/2022. She subsequently developed post-ERCP pancreatitis and gradually had improved symptoms and downward trending LFTs.   Yesterday she developed acute RUQ abdominal pain with radiation to right shoulder. Had persistent nausea and vomiting. Has been unable to eat. She had diarrhea for about a week after cholecystectomy but has resolved since. No fever or chills. Denies any alcohol or illicit drug use.    In the ED she was afebrile, leukocytosis of 16.1.  LFT remains slightly elevated although improved with AST of 42, ALT of 63.  However total bilirubin previously normal was not elevated at 2.6.  Lipase within normal limits.  CT abdomen/pelvis shows cholecystectomy and mildly dilated biliary ducts-unchanged from prior.  However given her upward trending total bilirubin still suspicious for biliary obstruction.  GI Dr. Lorenso Courier with  was consulted and recommends MRCP and hospitalist was consulted for admission.  Review of Systems: As mentioned in the history of present illness. All other systems reviewed and are negative. Past Medical History:  Diagnosis Date   Bipolar 1 disorder ([place])    Chlamydia    Complication of anesthesia    low BP, drowsy   Depression     Pregnancy induced hypertension    Past Surgical History:  Procedure Laterality Date   CESAREAN SECTION     CHOLECYSTECTOMY N/A 01/01/2022   Procedure: LAPAROSCOPIC CHOLECYSTECTOMY WITH ICG DYE AND INTRAOPERATIVE CHOLANGIOGRAM;  Surgeon: Clovis Riley, MD;  Location: [facility];  Service: General;  Laterality: N/A;   ERCP N/A 01/02/2022   Procedure: ENDOSCOPIC RETROGRADE CHOLANGIOPANCREATOGRAPHY (ERCP);  Surgeon: Jackquline Denmark, MD;  Location: [facility];  Service: Gastroenterology;  Laterality: N/A;   HERNIA REPAIR     REMOVAL OF STONES  01/02/2022   Procedure: REMOVAL OF STONES;  Surgeon: Jackquline Denmark, MD;  Location: [facility];  Service: Gastroenterology;;   Joan Mayans  01/02/2022   Procedure: Joan Mayans;  Surgeon: Jackquline Denmark, MD;  Location: [facility];  Service: Gastroenterology;;   TUBAL LIGATION Bilateral 02/23/2017   Procedure: POST PARTUM TUBAL LIGATION;  Surgeon: [name], MD;  Location: [facility];  Service: Gynecology;  Laterality: Bilateral;   Social History:  reports that she has quit smoking. She has never used smokeless tobacco. She reports that she does not currently use alcohol. She reports that she does not use drugs.  Allergies  Allergen Reactions   Latex Itching and Other (See Comments)    Itching and yeast like symptoms with latex condom use.    Family History  Problem Relation Age of Onset   Cholecystitis Sister    Cancer Maternal Grandmother     Prior to Admission medications   Medication Sig Start Date End Date Taking? Authorizing Provider  Multiple Vitamins-Minerals (MULTIVITAMIN WITH MINERALS) tablet Take 1 tablet by mouth daily.    [provider]  omeprazole (PRILOSEC) 40 MG capsule Take 1 capsule by mouth  daily. 12/30/21 01/29/22  [provider]  ondansetron (ZOFRAN-ODT) 4 MG disintegrating tablet Take 1 tablet (4 mg total) by mouth every 8 (eight) hours as needed for nausea or vomiting. 08/28/20    Davonna Belling, MD  oxyCODONE (OXY IR/ROXICODONE) 5 MG immediate release tablet Take 1 tablet (5 mg total) by mouth every 6 (six) hours as needed for severe pain. 01/04/22   Wellington Hampshire, PA-C    Physical Exam: Vitals:   02/22/22 1433 02/22/22 2000 02/22/22 2130 02/22/22 2230  BP: (!) 134/95 (!) 141/90 126/66 108/80  Pulse: (!) 111 96 87 87  Resp: 16 (!) 24 12 18   Temp: 98.6 F (37 C) 98.6 F (37 C)    SpO2: 98% 99% 100% 99%   Constitutional: NAD, calm, uncomfortable appearing young female sitting upright in bed Eyes: lids and conjunctivae normal ENMT: Mucous membranes are moist.  Neck: normal, supple Respiratory: clear to auscultation bilaterally, no wheezing, no crackles. Normal respiratory effort. No accessory muscle use.  Cardiovascular: Regular rate and rhythm, no murmurs / rubs / gallops. Abdomen: Soft, nondistended, epigastric and right upper quadrant tenderness.  No guarding, rigidity or rebound tenderness.  Bowel sounds positive.  Musculoskeletal: no clubbing / cyanosis. No joint deformity upper and lower extremities. Good ROM, no contractures. Normal muscle tone.  Skin: no rashes, lesions, ulcers.  Neurologic: CN 2-12 grossly intact.  Strength 5/5 in all 4.  Psychiatric: Normal judgment and insight. Alert and oriented x 3. Normal mood. Data Reviewed:  See HPI  Assessment and Plan: * Abdominal pain -hx of recent laparoscopic cholecystectomy with intraoperatively found distal CBD stone requiring ERCP presenting with RUQ abdominal pain and upward trending total bilirubin concerning for choledocholithiasis -Will obtain MRCP -[facility] Dr. Lorenso Courier initially consulted but turns out to be [facility] pt. I have sent message to [facility] Dr. Paulita Fujita to consult in the morning. -Continue to trend LFTs -PRN IV morphine and IV dilaudid for pain  Leukocytosis -likely reactive. She is afebrile at this time for low threshold to initiate antibiotics for cholangitis if she develops  fever overnight.      Advance Care Planning: Full  Consults: [facility]  Family Communication: none at bedside  Severity of Illness: The appropriate patient status for this patient is OBSERVATION. Observation status is judged to be reasonable and necessary in order to provide the required intensity of service to ensure the patient's safety. The patient's presenting symptoms, physical exam findings, and initial radiographic and laboratory data in the context of their medical condition is felt to place them at decreased risk for further clinical deterioration. Furthermore, it is anticipated that the patient will be medically stable for discharge from the hospital within 2 midnights of admission.   Author: Orene Desanctis, DO 02/23/2022 12:08 AM  For on call review [email or web address].

## 2022-02-22 NOTE — H&P (Incomplete)
History and Physical    Patient: Jamie Hansen ZOX:[id number] DOB: [birth year] DOA: 02/22/2022 DOS: the patient was seen and examined on 02/22/2022 PCP: Patient, No Pcp Per  Patient coming from: Home  Chief Complaint:  Chief Complaint  Patient presents with  . Abdominal Pain   HPI: Jamie Hansen is a 27 y.o. female with medical history significant of bipolar disorder, anxiety, recent cholecystecomy, ERCP pancreatitis who presents with RUQ abdominal pain.   She was hospitalized from 12/31/2021-01/05/2022 and initially diagnosed with acute acalculus cholecystitis and underwent laproscopic cholecystectomy with general surgery but intraoperatively found to have distal CBD stone and underwent ERCP with GI on 01/02/2022. She subsequently developed post-ERCP pancreatitis and gradually had improved symptoms and downward trending LFTs.   Yesterday she developed acute RUQ abdominal pain with radiation to right shoulder. Had persistent nausea and vomiting. Has been unable to eat. She had diarrhea for about a week after cholecystectomy but has resolved since. No fever or chills. Denies any alcohol or illicit drug use.    In the ED she was afebrile, leukocytosis of 16.1.  LFT remains slightly elevated although improved with AST of 42, ALT of 63.  However total bilirubin previously normal was not elevated at 2.6.  Lipase within normal limits.  CT abdomen/pelvis shows cholecystectomy and mildly dilated biliary ducts-unchanged from prior.  However given her upward trending total bilirubin still suspicious for biliary obstruction.  GI Dr. Lorenso Courier with [facility] was consulted and recommends MRCP and hospitalist was consulted for admission.  Review of Systems: As mentioned in the history of present illness. All other systems reviewed and are negative. Past Medical History:  Diagnosis Date  . Bipolar 1 disorder ([place])   . Chlamydia   . Complication of anesthesia    low BP, drowsy  . Depression    . Pregnancy induced hypertension    Past Surgical History:  Procedure Laterality Date  . CESAREAN SECTION    . CHOLECYSTECTOMY N/A 01/01/2022   Procedure: LAPAROSCOPIC CHOLECYSTECTOMY WITH ICG DYE AND INTRAOPERATIVE CHOLANGIOGRAM;  Surgeon: Clovis Riley, MD;  Location: [facility];  Service: General;  Laterality: N/A;  . ERCP N/A 01/02/2022   Procedure: ENDOSCOPIC RETROGRADE CHOLANGIOPANCREATOGRAPHY (ERCP);  Surgeon: Jackquline Denmark, MD;  Location: [facility];  Service: Gastroenterology;  Laterality: N/A;  . HERNIA REPAIR    . REMOVAL OF STONES  01/02/2022   Procedure: REMOVAL OF STONES;  Surgeon: Jackquline Denmark, MD;  Location: [facility];  Service: Gastroenterology;;  . Jamie Hansen  01/02/2022   Procedure: Jamie Hansen;  Surgeon: Jackquline Denmark, MD;  Location: [facility];  Service: Gastroenterology;;  . TUBAL LIGATION Bilateral 02/23/2017   Procedure: POST PARTUM TUBAL LIGATION;  Surgeon: Jamie Macadam, MD;  Location: [facility];  Service: Gynecology;  Laterality: Bilateral;   Social History:  reports that she has quit smoking. She has never used smokeless tobacco. She reports that she does not currently use alcohol. She reports that she does not use drugs.  Allergies  Allergen Reactions  . Latex Itching and Other (See Comments)    Itching and yeast like symptoms with latex condom use.    Family History  Problem Relation Age of Onset  . Cholecystitis Sister   . Cancer Maternal Grandmother     Prior to Admission medications   Medication Sig Start Date End Date Taking? Authorizing Provider  Multiple Vitamins-Minerals (MULTIVITAMIN WITH MINERALS) tablet Take 1 tablet by mouth daily.    [provider]  omeprazole (PRILOSEC) 40 MG capsule Take 1 capsule by mouth  daily. 12/30/21 01/29/22  [provider]  ondansetron (ZOFRAN-ODT) 4 MG disintegrating tablet Take 1 tablet (4 mg total) by mouth every 8 (eight) hours as needed for nausea or vomiting.  08/28/20   Davonna Belling, MD  oxyCODONE (OXY IR/ROXICODONE) 5 MG immediate release tablet Take 1 tablet (5 mg total) by mouth every 6 (six) hours as needed for severe pain. 01/04/22   Wellington Hampshire, PA-C    Physical Exam: Vitals:   02/22/22 1433 02/22/22 2000 02/22/22 2130 02/22/22 2230  BP: (!) 134/95 (!) 141/90 126/66 108/80  Pulse: (!) 111 96 87 87  Resp: 16 (!) 24 12 18   Temp: 98.6 F (37 C) 98.6 F (37 C)    SpO2: 98% 99% 100% 99%   *** Data Reviewed: {Tip this will not be part of the note when signed- Document your independent interpretation of telemetry tracing, EKG, lab, Radiology test or any other diagnostic tests. Add any new diagnostic test ordered today. (Optional):[id number]} See HPI  Assessment and Plan: No notes have been filed under this hospital service. Service: Hospitalist     Advance Care Planning: Full  Consults: [facility]  Family Communication: none at bedside  Severity of Illness: The appropriate patient status for this patient is OBSERVATION. Observation status is judged to be reasonable and necessary in order to provide the required intensity of service to ensure the patient's safety. The patient's presenting symptoms, physical exam findings, and initial radiographic and laboratory data in the context of their medical condition is felt to place them at decreased risk for further clinical deterioration. Furthermore, it is anticipated that the patient will be medically stable for discharge from the hospital within 2 midnights of admission.   Author: Orene Desanctis, DO 02/22/2022 11:16 PM  For on call review [email or web address].

## 2022-02-22 NOTE — ED Provider Triage Note (Signed)
Emergency Medicine Provider Triage Evaluation Note  Jamie Hansen , a 27 y.o. female  was evaluated in triage.  Pt complains of quadrant pain since this morning.  Had cholecystectomy in November and was doing well until this morning.  She is also having nausea.  Review of Systems  Positive: Abdominal pain Negative: fever  Physical Exam  BP (!) 134/95 (BP Location: Left Arm)   Pulse (!) 111   Temp 98.6 F (37 C)   Resp 16   SpO2 98%  Gen:   Awake, no distress   Resp:  Normal effort  MSK:   Moves extremities without difficulty  Other:    Medical Decision Making  Medically screening exam initiated at 2:58 PM.  Appropriate orders placed.  Jamie Hansen was informed that the remainder of the evaluation will be completed by another provider, this initial triage assessment does not replace that evaluation, and the importance of remaining in the ED until their evaluation is complete.     Jamie Hansen, [place] 02/22/22 1510

## 2022-02-22 NOTE — Assessment & Plan Note (Signed)
-  hx of recent laparoscopic cholecystectomy with intraoperatively found distal CBD stone requiring ERCP presenting with RUQ abdominal pain and upward trending total bilirubin concerning for choledocholithiasis -Will obtain MRCP -[facility] Dr. Lorenso Courier initially consulted but turns out to be [facility] pt. I have sent message to [facility] Dr. Paulita Fujita to consult in the morning. -Continue to trend LFTs -PRN IV morphine and IV dilaudid for pain

## 2022-02-22 NOTE — ED Notes (Signed)
ED TO INPATIENT HANDOFF REPORT  Name/Age/Gender Jamie Hansen 27 y.o. female  Code Status Code Status History     Date Active Date Inactive Code Status Order ID Comments User Context   01/01/2022 0544 01/05/2022 1849 Full Code [id number]  Hillary Bow, DO Inpatient   02/23/2017 0531 02/25/2017 1730 Full Code [id number]  Arabella Merles, CNM Inpatient   02/22/2017 6301 02/23/2017 0530 Full Code [id number]  Marylene Land, CNM Inpatient       Home/SNF/Other Home  Chief Complaint Abdominal pain [R10.9]  Level of Care/Admitting Diagnosis ED Disposition     ED Disposition  Admit   Condition  --   Comment  Hospital Area: [facility] [[id number]]  Level of Care: Med-Surg [16]  May place patient in observation at [facility] or [facility] if equivalent level of care is available:: No  Covid Evaluation: Asymptomatic - no recent exposure (last 10 days) testing not required  Diagnosis: Abdominal pain [744753]  Admitting Physician: Anselm Jungling [[id number]]  Attending Physician: Anselm Jungling [[id number]]          Medical History Past Medical History:  Diagnosis Date   Bipolar 1 disorder (H[facility])    Chlamydia    Complication of anesthesia    low BP, drowsy   Depression    Pregnancy induced hypertension     Allergies Allergies  Allergen Reactions   Latex Itching and Other (See Comments)    Itching and yeast like symptoms with latex condom use.    IV Location/Drains/Wounds Patient Lines/Drains/Airways Status     Active Line/Drains/Airways     Name Placement date Placement time Site Days   Peripheral IV 02/22/22 22 G Left Antecubital 02/22/22  2036  Antecubital  less than 1   Incision (Closed) 01/01/22 Abdomen 01/01/22  1414  -- 52   Incision - 4 Ports Abdomen Umbilicus Right;Lower Right;Upper Right;Mid 01/01/22  --  -- 52            Labs/Imaging Results for orders placed or performed during the hospital encounter of 02/22/22 (from  the past 48 hour(s))  Urinalysis, Routine w reflex microscopic     Status: Abnormal   Collection Time: 02/22/22  3:11 PM  Result Value Ref Range   Color, Urine YELLOW YELLOW   APPearance CLEAR CLEAR   Specific Gravity, Urine 1.012 1.005 - 1.030   pH 6.0 5.0 - 8.0   Glucose, UA NEGATIVE NEGATIVE mg/dL   Hgb urine dipstick MODERATE (A) NEGATIVE   Bilirubin Urine NEGATIVE NEGATIVE   Ketones, ur 20 (A) NEGATIVE mg/dL   Protein, ur 30 (A) NEGATIVE mg/dL   Nitrite NEGATIVE NEGATIVE   Leukocytes,Ua NEGATIVE NEGATIVE   RBC / HPF 11-20 0 - 5 RBC/hpf   WBC, UA 0-5 0 - 5 WBC/hpf   Bacteria, UA NONE SEEN NONE SEEN   Squamous Epithelial / LPF 0-5 0 - 5 /HPF    Comment: Performed at [facility], [address] [place], [place] [address]  Pregnancy, urine     Status: None   Collection Time: 02/22/22  3:11 PM  Result Value Ref Range   Preg Test, Ur NEGATIVE NEGATIVE    Comment:        THE SENSITIVITY OF THIS METHODOLOGY IS >20 mIU/mL. Performed at [facility], [address] [place], [place] [address]   CBC with Differential     Status: Abnormal   Collection Time: 02/22/22  3:21 PM  Result Value Ref Range  WBC 16.1 (H) 4.0 - 10.5 K/uL   RBC 4.87 3.87 - 5.11 MIL/uL   Hemoglobin 14.1 12.0 - 15.0 g/dL   HCT 40.5 36.0 - 46.0 %   MCV 83.2 80.0 - 100.0 fL   MCH 29.0 26.0 - 34.0 pg   MCHC 34.8 30.0 - 36.0 g/dL   RDW 13.8 11.5 - 15.5 %   Platelets 191 150 - 400 K/uL   nRBC 0.0 0.0 - 0.2 %   Neutrophils Relative % 90 %   Neutro Abs 14.6 (H) 1.7 - 7.7 K/uL   Lymphocytes Relative 4 %   Lymphs Abs 0.6 (L) 0.7 - 4.0 K/uL   Monocytes Relative 4 %   Monocytes Absolute 0.7 0.1 - 1.0 K/uL   Eosinophils Relative 1 %   Eosinophils Absolute 0.1 0.0 - 0.5 K/uL   Basophils Relative 0 %   Basophils Absolute 0.0 0.0 - 0.1 K/uL   Immature Granulocytes 1 %   Abs Immature Granulocytes 0.11 (H) 0.00 - 0.07 K/uL    Comment: Performed at [facility], [place] [address] [place], [place] [address]  Comprehensive metabolic panel     Status: Abnormal   Collection Time: 02/22/22  3:21 PM  Result Value Ref Range   Sodium 131 (L) 135 - 145 mmol/L   Potassium 3.9 3.5 - 5.1 mmol/L   Chloride 98 98 - 111 mmol/L   CO2 23 22 - 32 mmol/L   Glucose, Bld 103 (H) 70 - 99 mg/dL    Comment: Glucose reference range applies only to samples taken after fasting for at least 8 hours.   BUN 7 6 - 20 mg/dL   Creatinine, Ser 0.74 0.44 - 1.00 mg/dL   Calcium 8.7 (L) 8.9 - 10.3 mg/dL   Total Protein 7.4 6.5 - 8.1 g/dL   Albumin 4.1 3.5 - 5.0 g/dL   AST 42 (H) 15 - 41 U/L   ALT 63 (H) 0 - 44 U/L   Alkaline Phosphatase 66 38 - 126 U/L   Total Bilirubin 2.6 (H) 0.3 - 1.2 mg/dL   GFR, Estimated >60 >60 mL/min    Comment: (NOTE) Calculated using the CKD-EPI Creatinine Equation (2021)    Anion gap 10 5 - 15    Comment: Performed at [facility], [place] [address] [place], [place] [address]  Lipase, blood     Status: None   Collection Time: 02/22/22  3:21 PM  Result Value Ref Range   Lipase 30 11 - 51 U/L    Comment: Performed at [facility], [place] [address], [place], [place] [address]  Lactic acid, plasma     Status: None   Collection Time: 02/22/22  8:13 PM  Result Value Ref Range   Lactic Acid, Venous 1.0 0.5 - 1.9 mmol/L    Comment: Performed at [facility], [place] [address], [place], [place] [address]  Protime-INR     Status: Abnormal   Collection Time: 02/22/22  8:13 PM  Result Value Ref Range   Prothrombin Time 15.7 (H) 11.4 - 15.2 seconds   INR 1.3 (H) 0.8 - 1.2    Comment: (NOTE) INR goal varies based on device and disease states. Performed at [facility], [place] [address], [place], [place] [address]   APTT     Status: None   Collection Time: 02/22/22  8:13 PM  Result Value Ref Range   aPTT 35 24 - 36 seconds    Comment: Performed  at [facility], [place] [address], [place], [place] [address]  I-Stat beta hCG blood, ED     Status: None   Collection Time: 02/22/22  9:51 PM  Result Value Ref Range   I-stat hCG, quantitative <5.0 <5 mIU/mL   Comment 3            Comment:   GEST. AGE      CONC.  (mIU/mL)   <=1 WEEK        5 - 50     2 WEEKS       50 - 500     3 WEEKS       100 - 10,000     4 WEEKS     1,000 - 30,000        FEMALE AND NON-PREGNANT FEMALE:     LESS THAN 5 mIU/mL    CT ABDOMEN PELVIS W CONTRAST  Result Date: 02/22/2022 CLINICAL DATA:  Biliary obstruction suspected. Patient has had her gallbladder removed 01/01/2022. EXAM: CT ABDOMEN AND PELVIS WITH CONTRAST TECHNIQUE: Multidetector CT imaging of the abdomen and pelvis was performed using the standard protocol following bolus administration of intravenous contrast. RADIATION DOSE REDUCTION: This exam was performed according to the departmental dose-optimization program which includes automated exposure control, adjustment of the mA and/or kV according to patient size and/or use of iterative reconstruction technique. CONTRAST:  172mL OMNIPAQUE IOHEXOL 300 MG/ML  SOLN COMPARISON:  ERCP dated January 02, 2022; CT examination dated December 31, 2021 FINDINGS: Lower chest: Bibasilar dependent atelectasis.  No acute abnormality. Hepatobiliary: No focal liver abnormality is seen. Status post cholecystectomy. Mildly dilated biliary ducts, unchanged from prior CT examination. Pneumobilia in the central biliary ducts, likely secondary to recent intervention or sphincter of Oddi dysfunction. No fluid collection. Pancreas: Unremarkable. No pancreatic ductal dilatation or surrounding inflammatory changes. Spleen: Normal in size without focal abnormality. Adrenals/Urinary Tract: Adrenal glands are unremarkable. Kidneys are normal, without renal calculi, focal lesion, or hydronephrosis. Bladder is unremarkable. Stomach/Bowel: Stomach is within normal limits. Appendix appears normal. No  evidence of bowel wall thickening, distention, or inflammatory changes. Vascular/Lymphatic: No significant vascular findings are present. No enlarged abdominal or pelvic lymph nodes. Reproductive: Uterus and right ovary are unremarkable. 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 Tubal ligation. Other: No abdominal wall hernia or abnormality. No abdominopelvic ascites. Musculoskeletal: No acute or significant osseous findings. IMPRESSION: 1. Status post cholecystectomy. Mildly dilated biliary ducts, unchanged from prior CT examination. Pneumobilia in the central biliary ducts, likely secondary to recent intervention or Sphincter of Oddi dysfunction. No fluid collection or abscess. 2. No CT evidence of acute abdominal/pelvic process. 3. 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 Electronically Signed   By: Keane Police D.O.   On: 02/22/2022 22:12   DG Chest Port 1 View  Result Date: 02/22/2022 CLINICAL DATA:  Questionable sepsis - evaluate for abnormality EXAM: PORTABLE CHEST - 1 VIEW COMPARISON:  None Available. FINDINGS: Cardiac silhouette is unremarkable. No pneumothorax or pleural effusion. The lungs are clear. The visualized skeletal structures are unremarkable. IMPRESSION: No acute cardiopulmonary process. Electronically Signed   By: Sammie Bench M.D.   On: 02/22/2022 20:32    Pending Labs Unresulted Labs (From admission, onward)     Start     Ordered   02/22/22 2013  Blood Culture (routine x 2)  (Undifferentiated presentation (screening labs and basic nursing orders))  BLOOD CULTURE X 2,   STAT  02/22/22 2013   02/22/22 2013  Urine Culture  (Undifferentiated presentation (screening labs and basic nursing orders))  ONCE - URGENT,   URGENT       Question:  Indication  Answer:  Sepsis   02/22/22 2013            Vitals/Pain Today's Vitals   02/22/22 2000 02/22/22 2130 02/22/22 2225 02/22/22 2230  BP: (!) 141/90 126/66  108/80  Pulse: 96 87  87  Resp: (!) 24  12  18   Temp: 98.6 F (37 C)     SpO2: 99% 100%  99%  PainSc:   7      Isolation Precautions No active isolations  Medications Medications  sodium chloride (PF) 0.9 % injection (has no administration in time range)  ondansetron (ZOFRAN-ODT) disintegrating tablet 4 mg (4 mg Oral Given 02/22/22 1515)  morphine (PF) 4 MG/ML injection 4 mg (4 mg Intravenous Given 02/22/22 2038)  sodium chloride 0.9 % bolus 1,000 mL (1,000 mLs Intravenous New Bag/Given 02/22/22 2105)  iohexol (OMNIPAQUE) 300 MG/ML solution 100 mL (100 mLs Intravenous Contrast Given 02/22/22 2148)  morphine (PF) 4 MG/ML injection 4 mg (4 mg Intravenous Given 02/22/22 2257)    Mobility walks with device

## 2022-02-22 NOTE — ED Provider Notes (Signed)
[facility] DEPT Provider Note   CSN: [id number] Arrival date & time: 02/22/22  1425     History  Chief Complaint  Patient presents with   Abdominal Pain    Jamie Hansen is a 27 y.o. female.  History of laparoscopic cholecystectomy and operative cholangiogram on November 10, she had a distal common bile duct stone removed by ERCP intraoperatively and developed post ERCP acute pancreatitis.  She surgery on November 10 and was discharged on November 14.  She been doing well since then but started having some mild right upper quadrant pain last night, woke up this morning with some worsened pain.  She came to the ED with initially relatively mild pain has become significantly more uncomfortable while waiting for room.  She admits to pain that radiates to her right shoulder, no chest pain or shortness of breath.  Denies any fevers but states she has felt hot and flushed.   Abdominal Pain      Home Medications Prior to Admission medications   Medication Sig Start Date End Date Taking? Authorizing Provider  Multiple Vitamins-Minerals (MULTIVITAMIN WITH MINERALS) tablet Take 1 tablet by mouth daily.    [provider]  omeprazole (PRILOSEC) 40 MG capsule Take 1 capsule by mouth daily. 12/30/21 01/29/22  [provider]  ondansetron (ZOFRAN-ODT) 4 MG disintegrating tablet Take 1 tablet (4 mg total) by mouth every 8 (eight) hours as needed for nausea or vomiting. 08/28/20   Davonna Belling, MD  oxyCODONE (OXY IR/ROXICODONE) 5 MG immediate release tablet Take 1 tablet (5 mg total) by mouth every 6 (six) hours as needed for severe pain. 01/04/22   Meuth, Blaine Hamper, PA-C      Allergies    Latex    Review of Systems   Review of Systems  Gastrointestinal:  Positive for abdominal pain.    Physical Exam Updated Vital Signs BP (!) 141/90   Pulse 96   Temp 98.6 F (37 C)   Resp (!) 24   SpO2 99%  Physical Exam Vitals and nursing  note reviewed.  Constitutional:      General: She is not in acute distress.    Appearance: She is well-developed.  HENT:     Head: Normocephalic and atraumatic.  Eyes:     Conjunctiva/sclera: Conjunctivae normal.  Cardiovascular:     Rate and Rhythm: Normal rate and regular rhythm.     Heart sounds: No murmur heard. Pulmonary:     Effort: Pulmonary effort is normal. No respiratory distress.     Breath sounds: Normal breath sounds.  Abdominal:     Palpations: Abdomen is soft.     Tenderness: There is abdominal tenderness in the right upper quadrant.  Musculoskeletal:        General: No swelling.     Cervical back: Neck supple.  Skin:    General: Skin is warm and dry.     Capillary Refill: Capillary refill takes less than 2 seconds.  Neurological:     Mental Status: She is alert.  Psychiatric:        Mood and Affect: Mood normal.     ED Results / Procedures / Treatments   Labs (all labs ordered are listed, but only abnormal results are displayed) Labs Reviewed  CBC WITH DIFFERENTIAL/PLATELET - Abnormal; Notable for the following components:      Result Value   WBC 16.1 (*)    Neutro Abs 14.6 (*)    Lymphs Abs 0.6 (*)  Abs Immature Granulocytes 0.11 (*)    All other components within normal limits  COMPREHENSIVE METABOLIC PANEL - Abnormal; Notable for the following components:   Sodium 131 (*)    Glucose, Bld 103 (*)    Calcium 8.7 (*)    AST 42 (*)    ALT 63 (*)    Total Bilirubin 2.6 (*)    All other components within normal limits  URINALYSIS, ROUTINE W REFLEX MICROSCOPIC - Abnormal; Notable for the following components:   Hgb urine dipstick MODERATE (*)    Ketones, ur 20 (*)    Protein, ur 30 (*)    All other components within normal limits  CULTURE, BLOOD (ROUTINE X 2)  CULTURE, BLOOD (ROUTINE X 2)  URINE CULTURE  LIPASE, BLOOD  PREGNANCY, URINE  LACTIC ACID, PLASMA  LACTIC ACID, PLASMA  PROTIME-INR  APTT  I-STAT BETA HCG BLOOD, ED ([facility], [facility], [facility] ONLY)     EKG None  Radiology No results found.  Procedures Procedures    Medications Ordered in ED Medications  morphine (PF) 4 MG/ML injection 4 mg (has no administration in time range)  sodium chloride 0.9 % bolus 1,000 mL (has no administration in time range)  ondansetron (ZOFRAN-ODT) disintegrating tablet 4 mg (4 mg Oral Given 02/22/22 1515)    ED Course/ Medical Decision Making/ A&P                           Medical Decision Making Patient presented with right lower quadrant pain which is now worsened since she has been sitting in the waiting room, he was noted to have a leukocytosis and was initially tachycardic.  This is normal.  I have low suspicion for retained common bile duct stone given that she had an ERCP.  She does have significant right upper quadrant tenderness, CT is pending to rule out other intra-abdominal process such as an abscess.  Signed out to oncoming team  Amount and/or Complexity of Data Reviewed Labs: ordered. Radiology: ordered. ECG/medicine tests: ordered.  Risk Prescription drug management.           Final Clinical Impression(s) / ED Diagnoses Final diagnoses:  None    Rx / DC Orders ED Discharge Orders     None         Darci Current 02/22/22 2203    Drenda Freeze, MD 03/01/22 1558

## 2022-02-22 NOTE — ED Provider Notes (Signed)
Patient signed out to me at shift change.    Here with RUQ pain.  Recent surg. Hx of cholecystectomy complicated by retained stone requiring ERCP, further complicated by development of post-ERCP pancreatitis.  Moderate leukocytosis.  T. Bili uptrending.  CT pending.  CT shows mildly dilated biliary ducts, s/p cholecystectomy, with pneumobilia thought 2/2 procedure or sphincter of oddi dysfunction.  In the setting of increased pain, uptrending t. Bili, and leukocytosis, I consulted with Dr. Lorenso Courier, from [place], who recommends MRCP in the morning and medicine admission.  GI to consult in the morning.  11:12 PM I consulted with Dr. Flossie Buffy, Hospitalist, who is appreciated for admitting the patient.  Plan: Admit to medicine. MRCP in the morning. GI to consult.    Montine Circle, PA-C 02/22/22 2313    Regan Lemming, MD 02/22/22 [phone]

## 2022-02-23 ENCOUNTER — Observation Stay (HOSPITAL_COMMUNITY): Payer: BC Managed Care – PPO

## 2022-02-23 DIAGNOSIS — Z23 Encounter for immunization: Secondary | ICD-10-CM | POA: Diagnosis not present

## 2022-02-23 DIAGNOSIS — Z91040 Latex allergy status: Secondary | ICD-10-CM | POA: Diagnosis not present

## 2022-02-23 DIAGNOSIS — K8050 Calculus of bile duct without cholangitis or cholecystitis without obstruction: Secondary | ICD-10-CM | POA: Diagnosis not present

## 2022-02-23 DIAGNOSIS — F419 Anxiety disorder, unspecified: Secondary | ICD-10-CM | POA: Diagnosis present

## 2022-02-23 DIAGNOSIS — Z87891 Personal history of nicotine dependence: Secondary | ICD-10-CM | POA: Diagnosis not present

## 2022-02-23 DIAGNOSIS — E876 Hypokalemia: Secondary | ICD-10-CM | POA: Diagnosis present

## 2022-02-23 DIAGNOSIS — R1011 Right upper quadrant pain: Secondary | ICD-10-CM | POA: Diagnosis present

## 2022-02-23 DIAGNOSIS — N839 Noninflammatory disorder of ovary, fallopian tube and broad ligament, unspecified: Secondary | ICD-10-CM | POA: Diagnosis present

## 2022-02-23 DIAGNOSIS — F319 Bipolar disorder, unspecified: Secondary | ICD-10-CM | POA: Diagnosis present

## 2022-02-23 DIAGNOSIS — K859 Acute pancreatitis without necrosis or infection, unspecified: Secondary | ICD-10-CM | POA: Diagnosis present

## 2022-02-23 DIAGNOSIS — Z9049 Acquired absence of other specified parts of digestive tract: Secondary | ICD-10-CM | POA: Diagnosis not present

## 2022-02-23 DIAGNOSIS — R7401 Elevation of levels of liver transaminase levels: Secondary | ICD-10-CM | POA: Diagnosis present

## 2022-02-23 DIAGNOSIS — K838 Other specified diseases of biliary tract: Secondary | ICD-10-CM | POA: Diagnosis present

## 2022-02-23 DIAGNOSIS — D72829 Elevated white blood cell count, unspecified: Secondary | ICD-10-CM | POA: Diagnosis present

## 2022-02-23 DIAGNOSIS — K2970 Gastritis, unspecified, without bleeding: Secondary | ICD-10-CM | POA: Diagnosis present

## 2022-02-23 DIAGNOSIS — Z79899 Other long term (current) drug therapy: Secondary | ICD-10-CM | POA: Diagnosis not present

## 2022-02-23 LAB — COMPREHENSIVE METABOLIC PANEL
ALT: 47 U/L — ABNORMAL HIGH (ref 0–44)
AST: 29 U/L (ref 15–41)
Albumin: 3.7 g/dL (ref 3.5–5.0)
Alkaline Phosphatase: 59 U/L (ref 38–126)
Anion gap: 9 (ref 5–15)
BUN: 8 mg/dL (ref 6–20)
CO2: 19 mmol/L — ABNORMAL LOW (ref 22–32)
Calcium: 7.5 mg/dL — ABNORMAL LOW (ref 8.9–10.3)
Chloride: 102 mmol/L (ref 98–111)
Creatinine, Ser: 0.66 mg/dL (ref 0.44–1.00)
GFR, Estimated: >60 mL/min (ref >60)
Glucose, Bld: 95 mg/dL (ref 70–99)
Potassium: 2.9 mmol/L — ABNORMAL LOW (ref 3.5–5.1)
Sodium: 130 mmol/L — ABNORMAL LOW (ref 135–145)
Total Bilirubin: 1.9 mg/dL — ABNORMAL HIGH (ref 0.3–1.2)
Total Protein: 6.9 g/dL (ref 6.5–8.1)

## 2022-02-23 LAB — CBC
HCT: 35.1 % — ABNORMAL LOW (ref 36.0–46.0)
Hemoglobin: 11.9 g/dL — ABNORMAL LOW (ref 12.0–15.0)
MCH: 28.5 pg (ref 26.0–34.0)
MCHC: 33.9 g/dL (ref 30.0–36.0)
MCV: 84 fL (ref 80.0–100.0)
Platelets: 146 10*3/uL — ABNORMAL LOW (ref 150–400)
RBC: 4.18 MIL/uL (ref 3.87–5.11)
RDW: 13.9 % (ref 11.5–15.5)
WBC: 9.8 10*3/uL (ref 4.0–10.5)
nRBC: 0 % (ref 0.0–0.2)

## 2022-02-23 LAB — URINE CULTURE: Culture: <10000 — AB

## 2022-02-23 MED ORDER — INFLUENZA VAC SPLIT QUAD 0.5 ML IM SUSY
0.5000 mL | PREFILLED_SYRINGE | INTRAMUSCULAR | Status: AC | PRN
  Administered 2022-02-25: 0.5 mL via INTRAMUSCULAR
  Filled 2022-02-23: qty 0.5

## 2022-02-23 MED ORDER — POTASSIUM CHLORIDE 10 MEQ/100ML IV SOLN
10.0000 meq | INTRAVENOUS | Status: AC
  Administered 2022-02-23 (×4): 10 meq via INTRAVENOUS
  Filled 2022-02-23 (×4): qty 100

## 2022-02-23 MED ORDER — LACTATED RINGERS IV SOLN: INTRAVENOUS | Status: DC

## 2022-02-23 NOTE — TOC CM/SW Note (Signed)
Transition of Care [facility]) Screening Note  Patient Details  Name: Jamie Hansen Date of Birth: [birth year]  Transition of Care [facility]) CM/SW Contact:    Sherie Don, LCSW Phone Number: 02/23/2022, 10:31 AM  Transition of Care Department [facility]) has reviewed patient and no TOC needs have been identified at this time. We will continue to monitor patient advancement through interdisciplinary progression rounds. If new patient transition needs arise, please place a TOC consult.

## 2022-02-23 NOTE — Assessment & Plan Note (Signed)
-  likely reactive. She is afebrile at this time for low threshold to initiate antibiotics for cholangitis if she develops fever overnight.

## 2022-02-23 NOTE — Progress Notes (Signed)
[facility] Progress Note Patient: Jamie Hansen BZJ:[id number] DOB: Sep 20, 1995 DOA: 02/22/2022  DOS: the patient was seen and examined on 02/23/2022  Brief hospital course: PMH of bipolar disorder, anxiety, recent cholecystectomy, ERCP and ERCP pancreatitis present to the hospital complains of RUQ pain.  Found to have hyperbilirubinemia with elevated LFT and MRCP showing dilated bile ducts. [facility] was consulted who discussed with Dr. Paulita Fujita and Dr. Rush Landmark and plan is for EUS and ERCP on 1/3. Assessment and Plan: Biliary colic. Recent lap chole cystectomy and ERCP. ERCP pancreatitis. Presents with abdominal pain. CT abdomen showed dilated biliary ducts and pneumobilia. MRCP shows no filling defect but biliary ductal distention concerning for an occult intraductal calculi. GI was consulted. Currently plan for ERCP EUS on 1/3. N.p.o. after midnight. Continue IV fluids IV pain medication and IV antiemetics. Monitor LFTs.  Transaminitis. Hyperbilirubinemia. Currently improving with hydration. Monitor.  Leukocytosis. Likely stress reaction. Monitor  Hypokalemia Currently being replaced per Monitor.  Left ovarian mass. Possible dermoid. Increased in size since ultrasound performed on 07/11/2016. Outpatient follow-up with GYN recommended.   Subjective: No nausea no vomiting.  Continues to have abdominal pain.  No fever no chills.  Not passing gas.  No BM.  Physical Exam: General: in Mild distress, No Rash Cardiovascular: S1 and S2 Present, No Murmur Respiratory: Good respiratory effort, Bilateral Air entry present. No Crackles, No wheezes Abdomen: Bowel Sound present, mild diffuse tenderness Extremities: No edema Neuro: Alert and oriented x3, no new focal deficit  Data Reviewed: I have Reviewed nursing notes, Vitals, and Lab results. Since last encounter, pertinent lab results CBC and BMP   . I have ordered test including CBC and CMP on the same  . I have  discussed pt's care plan and test results with [facility]  .   Disposition: Status is: Inpatient Remains inpatient appropriate because: Need for intervention for biliary colic.  SCDs Start: 02/22/22 2350   Family Communication: No one at bedside Level of care: Med-Surg  Vitals:   02/23/22 0541 02/23/22 1031 02/23/22 1316 02/23/22 1729  BP: (!) 93/58 109/75 (!) 107/91 121/76  Pulse: 83 71 76 88  Resp: 16  18 18   Temp: (!) 97.5 F (36.4 C) 98.9 F (37.2 C) 97.9 F (36.6 C) 98.3 F (36.8 C)  TempSrc: Oral     SpO2: 97%  100% 100%  Weight:      Height:         Author: Berle Mull, MD 02/23/2022 6:54 PM  Please look on [email or web address] to find out who is on call.

## 2022-02-23 NOTE — Hospital Course (Signed)
PMH of bipolar disorder, anxiety, recent cholecystectomy, ERCP and ERCP pancreatitis present to the hospital complains of RUQ pain.  Found to have hyperbilirubinemia with elevated LFT and MRCP showing dilated bile ducts. [facility] was consulted who discussed with Dr. Paulita Fujita and Dr. Rush Landmark and plan is for EUS and ERCP on 1/3.

## 2022-02-23 NOTE — Consult Note (Signed)
Referring Provider: Dr. Allena Katz Primary Care Physician:  Patient, No Pcp Per Primary Gastroenterologist:  Gentry Fitz  Reason for Consultation:  RUQ pain; Elevated LFTs  HPI: Jamie Hansen is a 27 y.o. female s/p lap chole followed by ERCP with CBD stone extraction and sphincterotomy in early November 2023 (Dr. Chales Abrahams who was on biliary backup). She had pain post-ERCP and was thought to possible have post-ERCP pancreatitis at that time. Reports doing ok at d/c until 2-3 weeks ago when she started having sharp RUQ pain radiating to her right shoulder. Had associated nausea/vomiting as well. TB 2.6, AST 42, ALT 63, WBC 16, Lipase 30. History obtained with Spanish interpreter using Stratus  machine. Nurse present during my evaluation.  Past Medical History:  Diagnosis Date   Bipolar 1 disorder (HCC)    Chlamydia    Complication of anesthesia    low BP, drowsy   Depression    Pregnancy induced hypertension     Past Surgical History:  Procedure Laterality Date   CESAREAN SECTION     CHOLECYSTECTOMY N/A 01/01/2022   Procedure: LAPAROSCOPIC CHOLECYSTECTOMY WITH ICG DYE AND INTRAOPERATIVE CHOLANGIOGRAM;  Surgeon: Berna Bue, MD;  Location: [facility];  Service: General;  Laterality: N/A;   ERCP N/A 01/02/2022   Procedure: ENDOSCOPIC RETROGRADE CHOLANGIOPANCREATOGRAPHY (ERCP);  Surgeon: Lynann Bologna, MD;  Location: [facility];  Service: Gastroenterology;  Laterality: N/A;   HERNIA REPAIR     REMOVAL OF STONES  01/02/2022   Procedure: REMOVAL OF STONES;  Surgeon: Lynann Bologna, MD;  Location: [facility];  Service: Gastroenterology;;   Dennison Mascot  01/02/2022   Procedure: Dennison Mascot;  Surgeon: Lynann Bologna, MD;  Location: [facility];  Service: Gastroenterology;;   TUBAL LIGATION Bilateral 02/23/2017   Procedure: POST PARTUM TUBAL LIGATION;  Surgeon: Federico Flake, MD;  Location: [facility];  Service: Gynecology;  Laterality: Bilateral;    Prior to  Admission medications   Medication Sig Start Date End Date Taking? Authorizing Provider  Multiple Vitamins-Minerals (MULTIVITAMIN WITH MINERALS) tablet Take 1 tablet by mouth daily.    [provider]  oxyCODONE (OXY IR/ROXICODONE) 5 MG immediate release tablet Take 1 tablet (5 mg total) by mouth every 6 (six) hours as needed for severe pain. 01/04/22   Meuth, Lina Sar, PA-C    Scheduled Meds: Continuous Infusions:  lactated ringers 75 mL/hr at 02/23/22 0802   PRN Meds:.HYDROmorphone (DILAUDID) injection, [START ON 02/24/2022] influenza vac split quadrivalent PF, morphine injection, ondansetron (ZOFRAN) IV  Allergies as of 02/22/2022 - Review Complete 02/22/2022  Allergen Reaction Noted   Latex Itching and Other (See Comments) 08/07/2014    Family History  Problem Relation Age of Onset   Cholecystitis Sister    Cancer Maternal Grandmother     Social History   Socioeconomic History   Marital status: Divorced    Spouse name: Not on file   Number of children: Not on file   Years of education: Not on file   Highest education level: Not on file  Occupational History   Not on file  Tobacco Use   Smoking status: Former   Smokeless tobacco: Never  Vaping Use   Vaping Use: Never used  Substance and Sexual Activity   Alcohol use: Not Currently    Comment: occ   Drug use: No   Sexual activity: Yes    Partners: Female    Birth control/protection: Surgical    Comment: 1ST INTERCOURSE- 14, PARTNERS- 3  Other Topics Concern   Not on file  Social  History Narrative   Not on file   Social Determinants of Health   Financial Resource Strain: Not on file  Food Insecurity: No Food Insecurity (02/23/2022)   Hunger Vital Sign    Worried About Running Out of Food in the Last Year: Never true    Ran Out of Food in the Last Year: Never true  Transportation Needs: No Transportation Needs (02/23/2022)   PRAPARE - Administrator, Civil Service (Medical): No    Lack of  Transportation (Non-Medical): No  Physical Activity: Not on file  Stress: Not on file  Social Connections: Not on file  Intimate Partner Violence: Not At Risk (02/23/2022)   Humiliation, Afraid, Rape, and Kick questionnaire    Fear of Current or Ex-Partner: No    Emotionally Abused: No    Physically Abused: No    Sexually Abused: No    Review of Systems: All negative except as stated above in HPI.  Physical Exam: Vital signs: Vitals:   02/23/22 1031 02/23/22 1316  BP: 109/75 (!) 107/91  Pulse: 71 76  Resp:  18  Temp: 98.9 F (37.2 C) 97.9 F (36.6 C)  SpO2:  100%   Last BM Date : 02/21/22 General:   Lethargic, Well-developed, well-nourished, pleasant and cooperative in NAD Head: normocephalic, atraumatic Eyes: anicteric sclera ENT: oropharynx clear Neck: supple, nontender Lungs:  Clear throughout to auscultation.   No wheezes, crackles, or rhonchi. No acute distress. Heart:  Regular rate and rhythm; no murmurs, clicks, rubs,  or gallops. Abdomen: RUQ tenderness with guarding, otherwise nontender, soft, nondistended, +BS  Rectal:  Deferred Ext: no edema  GI:  Lab Results: Recent Labs    02/22/22 1521 02/23/22 0342  WBC 16.1* 9.8  HGB 14.1 11.9*  HCT 40.5 35.1*  PLT 191 146*   BMET Recent Labs    02/22/22 1521 02/23/22 0342  NA 131* 130*  K 3.9 2.9*  CL 98 102  CO2 23 19*  GLUCOSE 103* 95  BUN 7 8  CREATININE 0.74 0.66  CALCIUM 8.7* 7.5*   LFT Recent Labs    02/23/22 0342  PROT 6.9  ALBUMIN 3.7  AST 29  ALT 47*  ALKPHOS 59  BILITOT 1.9*   PT/INR Recent Labs    02/22/22 2013  LABPROT 15.7*  INR 1.3*     Studies/Results: MR 3D Recon At Scanner  Result Date: 02/23/2022 CLINICAL DATA:  MRCP images acquired as part of the abdominal evaluation on February 23, 2022. Please refer to this report for additional information. EXAM: 3-DIMENSIONAL MR IMAGE RENDERING ON ACQUISITION WORKSTATION TECHNIQUE: 3-dimensional MR images were rendered by  post-processing of the original MR data on an acquisition workstation. The 3-dimensional MR images were interpreted and findings were reported in the accompanying complete MR report for this study COMPARISON:  Abdominal MRI, current 3D rendering of the biliary tree provided as part of this exam. FINDINGS: Please see dedicated report for details. 3D rendering of the biliary system was performed at a dedicated workstation and utilized for interpretation of this study. IMPRESSION: See dedicated report. Electronically Signed   By: Donzetta Kohut M.D.   On: 02/23/2022 10:08   MR ABDOMEN MRCP WO CONTRAST  Result Date: 02/23/2022 CLINICAL DATA:  Suspected biliary obstruction. Post recent cholecystectomy in November of 2023. EXAM: MRI ABDOMEN WITHOUT CONTRAST  (INCLUDING MRCP) TECHNIQUE: Multiplanar multisequence MR imaging of the abdomen was performed. Heavily T2-weighted images of the biliary and pancreatic ducts were obtained, and three-dimensional MRCP images were rendered  by post processing. COMPARISON:  Abdominal sonogram and CT imaging from December 31, 2021. Abdominal CT from February 22, 2022. FINDINGS: Lower chest: Incidental imaging of the lung bases with mild basilar atelectasis. No gross effusion. Limited assessment of the lung bases on this abdominal MRI. Hepatobiliary: Post cholecystectomy. Moderate pneumobilia involving the biliary tree similar to previous imaging though slightly increased on the RIGHT. Segmental biliary duct dilation of anterior division RIGHT hepatic ducts 2 hepatic subsegment VIII are not well assessed due to the presence of susceptibility artifact from pneumobilia. Likewise just below the biliary confluence is not well assessed due to susceptibility from clip artifact following cholecystectomy. No definitive signs of filling defect to suggest biliary calculus within the common bile duct. Pancreas: Pancreas with normal T1 signal and without signs of inflammation or ductal dilation.  Spleen: Normal size and contour. No visible splenic abnormality on noncontrast imaging. Adrenals/Urinary Tract: Adrenal glands and kidneys unremarkable on noncontrast imaging. Stomach/Bowel: No acute gastrointestinal findings to the extent evaluated. Vascular/Lymphatic: Normal caliber of abdominal vasculature without signs of adenopathy in the abdomen. Limited assessment due to lack of intravenous contrast. Other: LEFT ovarian dermoid not well assessed seen incidentally on coronal images. Ovary at nearly 5 cm with peripheral follicles but without adjacent stranding. Musculoskeletal: No suspicious bone lesions identified. IMPRESSION: 1. Moderate pneumobilia involving the biliary tree similar to previous imaging though slightly increased on the RIGHT. No definitive signs of filling defect to suggest biliary calculus within the common bile duct. Pneumobilia may be due to recent stone extraction. Would also correlate with any signs of cholangitis. 2. Segmental biliary duct distension of anterior division RIGHT hepatic ducts, findings are nonspecific but un usual given that secondary and tertiary biliary branches are seen up to 9-10 mm. Perhaps this is related to stricture of branches or sequela of prior infection, this would be the most common cause in a patient of this age. Occult intraductal calculi within the hepatic ducts are another possibility with neoplasm felt less likely given patient age. Follow-up MRCP with and without contrast in the short interval or comparison with prior your CP or ERCP follow-up as warranted is suggested. 3. LEFT ovarian dermoid not well assessed seen incidentally on coronal images. Ovary nearly 5 cm with peripheral follicles but without adjacent stranding, size is similar to recent CT. Correlate with any LEFT lower quadrant symptoms and with pelvic sonogram as warranted. Electronically Signed   By: Zetta Bills M.D.   On: 02/23/2022 09:39   CT ABDOMEN PELVIS W CONTRAST  Result  Date: 02/22/2022 CLINICAL DATA:  Biliary obstruction suspected. Patient has had her gallbladder removed 01/01/2022. EXAM: CT ABDOMEN AND PELVIS WITH CONTRAST TECHNIQUE: Multidetector CT imaging of the abdomen and pelvis was performed using the standard protocol following bolus administration of intravenous contrast. RADIATION DOSE REDUCTION: This exam was performed according to the departmental dose-optimization program which includes automated exposure control, adjustment of the mA and/or kV according to patient size and/or use of iterative reconstruction technique. CONTRAST:  16mL OMNIPAQUE IOHEXOL 300 MG/ML  SOLN COMPARISON:  ERCP dated January 02, 2022; CT examination dated December 31, 2021 FINDINGS: Lower chest: Bibasilar dependent atelectasis.  No acute abnormality. Hepatobiliary: No focal liver abnormality is seen. Status post cholecystectomy. Mildly dilated biliary ducts, unchanged from prior CT examination. Pneumobilia in the central biliary ducts, likely secondary to recent intervention or sphincter of Oddi dysfunction. No fluid collection. Pancreas: Unremarkable. No pancreatic ductal dilatation or surrounding inflammatory changes. Spleen: Normal in size without focal abnormality. Adrenals/Urinary Tract:  Adrenal glands are unremarkable. Kidneys are normal, without renal calculi, focal lesion, or hydronephrosis. Bladder is unremarkable. Stomach/Bowel: Stomach is within normal limits. Appendix appears normal. No evidence of bowel wall thickening, distention, or inflammatory changes. Vascular/Lymphatic: No significant vascular findings are present. No enlarged abdominal or pelvic lymph nodes. Reproductive: Uterus and right ovary are unremarkable. 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 Tubal ligation. Other: No abdominal wall hernia or abnormality. No abdominopelvic ascites. Musculoskeletal: No acute or significant osseous findings. IMPRESSION: 1. Status post cholecystectomy. Mildly dilated  biliary ducts, unchanged from prior CT examination. Pneumobilia in the central biliary ducts, likely secondary to recent intervention or Sphincter of Oddi dysfunction. No fluid collection or abscess. 2. No CT evidence of acute abdominal/pelvic process. 3. 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 Electronically Signed   By: Keane Police D.O.   On: 02/22/2022 22:12   DG Chest Port 1 View  Result Date: 02/22/2022 CLINICAL DATA:  Questionable sepsis - evaluate for abnormality EXAM: PORTABLE CHEST - 1 VIEW COMPARISON:  None Available. FINDINGS: Cardiac silhouette is unremarkable. No pneumothorax or pleural effusion. The lungs are clear. The visualized skeletal structures are unremarkable. IMPRESSION: No acute cardiopulmonary process. Electronically Signed   By: Sammie Bench M.D.   On: 02/22/2022 20:32    Impression/Plan: RUQ pain and mildly elevated total bilirubin 7 weeks after lap chole and ERCP with sphincterotomy with one CBD stone removed with post-ERCP pancreatitis. Recurrence of RUQ pain 2-3 weeks ago that she says is identical to her pain in early November prior to surgery. Transaminases normalizing since surgery. TB improving. MRCP reviewed and discussed with Dr. Paulita Fujita and Dr. Rush Landmark. EUS and ERCP tomorrow by Dr. Rush Landmark planned. NPO. Supportive care.    LOS: 0 days   [name]  02/23/2022, 4:01 PM  Questions please call [phone]

## 2022-02-24 ENCOUNTER — Inpatient Hospital Stay (HOSPITAL_COMMUNITY): Payer: BC Managed Care – PPO | Admitting: Certified Registered Nurse Anesthetist

## 2022-02-24 ENCOUNTER — Encounter (HOSPITAL_COMMUNITY): Payer: Self-pay | Admitting: Internal Medicine

## 2022-02-24 ENCOUNTER — Encounter (HOSPITAL_COMMUNITY)
Admission: EM | Disposition: A | Discharge status: Home / Self Care | Payer: Self-pay | Source: Home / Self Care | Attending: Family Medicine

## 2022-02-24 ENCOUNTER — Inpatient Hospital Stay (HOSPITAL_COMMUNITY): Payer: BC Managed Care – PPO

## 2022-02-24 DIAGNOSIS — K8050 Calculus of bile duct without cholangitis or cholecystitis without obstruction: Secondary | ICD-10-CM | POA: Diagnosis not present

## 2022-02-24 DIAGNOSIS — K2970 Gastritis, unspecified, without bleeding: Secondary | ICD-10-CM

## 2022-02-24 HISTORY — PX: ESOPHAGOGASTRODUODENOSCOPY: SHX5428

## 2022-02-24 HISTORY — PX: ERCP: SHX5425

## 2022-02-24 HISTORY — PX: UPPER ESOPHAGEAL ENDOSCOPIC ULTRASOUND (EUS): SHX6562

## 2022-02-24 HISTORY — PX: REMOVAL OF STONES: SHX5545

## 2022-02-24 HISTORY — PX: BIOPSY: SHX5522

## 2022-02-24 LAB — COMPREHENSIVE METABOLIC PANEL
ALT: 33 U/L (ref 0–44)
AST: 19 U/L (ref 15–41)
Albumin: 3.1 g/dL — ABNORMAL LOW (ref 3.5–5.0)
Alkaline Phosphatase: 48 U/L (ref 38–126)
Anion gap: 11 (ref 5–15)
BUN: 6 mg/dL (ref 6–20)
CO2: 19 mmol/L — ABNORMAL LOW (ref 22–32)
Calcium: 7.9 mg/dL — ABNORMAL LOW (ref 8.9–10.3)
Chloride: 104 mmol/L (ref 98–111)
Creatinine, Ser: 0.61 mg/dL (ref 0.44–1.00)
GFR, Estimated: >60 mL/min (ref >60)
Glucose, Bld: 72 mg/dL (ref 70–99)
Potassium: 3.3 mmol/L — ABNORMAL LOW (ref 3.5–5.1)
Sodium: 134 mmol/L — ABNORMAL LOW (ref 135–145)
Total Bilirubin: 1.4 mg/dL — ABNORMAL HIGH (ref 0.3–1.2)
Total Protein: 6.2 g/dL — ABNORMAL LOW (ref 6.5–8.1)

## 2022-02-24 LAB — CBC
HCT: 32.9 % — ABNORMAL LOW (ref 36.0–46.0)
Hemoglobin: 11.6 g/dL — ABNORMAL LOW (ref 12.0–15.0)
MCH: 29.8 pg (ref 26.0–34.0)
MCHC: 35.3 g/dL (ref 30.0–36.0)
MCV: 84.6 fL (ref 80.0–100.0)
Platelets: 141 10*3/uL — ABNORMAL LOW (ref 150–400)
RBC: 3.89 MIL/uL (ref 3.87–5.11)
RDW: 13.7 % (ref 11.5–15.5)
WBC: 7.2 10*3/uL (ref 4.0–10.5)
nRBC: 0 % (ref 0.0–0.2)

## 2022-02-24 LAB — MAGNESIUM: Magnesium: 1.7 mg/dL (ref 1.7–2.4)

## 2022-02-24 SURGERY — UPPER ESOPHAGEAL ENDOSCOPIC ULTRASOUND (EUS)
Anesthesia: General

## 2022-02-24 MED ORDER — CIPROFLOXACIN IN D5W 400 MG/200ML IV SOLN
INTRAVENOUS | Status: DC | PRN
  Administered 2022-02-24: 400 mg via INTRAVENOUS

## 2022-02-24 MED ORDER — PHENYLEPHRINE 80 MCG/ML (10ML) SYRINGE FOR IV PUSH (FOR BLOOD PRESSURE SUPPORT)
PREFILLED_SYRINGE | INTRAVENOUS | Status: DC | PRN
  Administered 2022-02-24 (×2): 120 ug via INTRAVENOUS

## 2022-02-24 MED ORDER — CIPROFLOXACIN IN D5W 400 MG/200ML IV SOLN
INTRAVENOUS | Status: AC
  Filled 2022-02-24: qty 200

## 2022-02-24 MED ORDER — MIDAZOLAM HCL 2 MG/2ML IJ SOLN
INTRAMUSCULAR | Status: AC
  Filled 2022-02-24: qty 2

## 2022-02-24 MED ORDER — GLUCAGON HCL RDNA (DIAGNOSTIC) 1 MG IJ SOLR
INTRAMUSCULAR | Status: AC
  Filled 2022-02-24: qty 2

## 2022-02-24 MED ORDER — FENTANYL CITRATE (PF) 100 MCG/2ML IJ SOLN
INTRAMUSCULAR | Status: DC | PRN
  Administered 2022-02-24: 100 ug via INTRAVENOUS

## 2022-02-24 MED ORDER — PANTOPRAZOLE SODIUM 40 MG PO TBEC
40.0000 mg | DELAYED_RELEASE_TABLET | Freq: Every day | ORAL | Status: DC
  Administered 2022-02-24 – 2022-02-25 (×2): 40 mg via ORAL
  Filled 2022-02-24 (×2): qty 1

## 2022-02-24 MED ORDER — SUGAMMADEX SODIUM 200 MG/2ML IV SOLN
INTRAVENOUS | Status: DC | PRN
  Administered 2022-02-24: 200 mg via INTRAVENOUS

## 2022-02-24 MED ORDER — SODIUM CHLORIDE 0.9 % IV SOLN
INTRAVENOUS | Status: DC | PRN
  Administered 2022-02-24: 14 mL

## 2022-02-24 MED ORDER — LIDOCAINE 2% (20 MG/ML) 5 ML SYRINGE
INTRAMUSCULAR | Status: DC | PRN
  Administered 2022-02-24: 100 mg via INTRAVENOUS

## 2022-02-24 MED ORDER — DICLOFENAC SUPPOSITORY 100 MG
RECTAL | Status: AC
  Filled 2022-02-24: qty 1

## 2022-02-24 MED ORDER — DICLOFENAC SUPPOSITORY 100 MG
RECTAL | Status: DC | PRN
  Administered 2022-02-24: 100 mg via RECTAL

## 2022-02-24 MED ORDER — ESMOLOL HCL 100 MG/10ML IV SOLN
INTRAVENOUS | Status: DC | PRN
  Administered 2022-02-24: 30 mg via INTRAVENOUS

## 2022-02-24 MED ORDER — ROCURONIUM BROMIDE 10 MG/ML (PF) SYRINGE
PREFILLED_SYRINGE | INTRAVENOUS | Status: DC | PRN
  Administered 2022-02-24: 50 mg via INTRAVENOUS

## 2022-02-24 MED ORDER — MIDAZOLAM HCL 5 MG/5ML IJ SOLN
INTRAMUSCULAR | Status: DC | PRN
  Administered 2022-02-24: 2 mg via INTRAVENOUS

## 2022-02-24 MED ORDER — PROPOFOL 10 MG/ML IV BOLUS
INTRAVENOUS | Status: AC
  Filled 2022-02-24: qty 20

## 2022-02-24 MED ORDER — FENTANYL CITRATE (PF) 100 MCG/2ML IJ SOLN
INTRAMUSCULAR | Status: AC
  Filled 2022-02-24: qty 2

## 2022-02-24 MED ORDER — DEXAMETHASONE SODIUM PHOSPHATE 4 MG/ML IJ SOLN
INTRAMUSCULAR | Status: DC | PRN
  Administered 2022-02-24: 10 mg via INTRAVENOUS

## 2022-02-24 MED ORDER — PROPOFOL 10 MG/ML IV BOLUS
INTRAVENOUS | Status: DC | PRN
  Administered 2022-02-24: 200 mg via INTRAVENOUS

## 2022-02-24 MED ORDER — SODIUM CHLORIDE 0.9 % IV SOLN: INTRAVENOUS | Status: DC

## 2022-02-24 MED ORDER — ONDANSETRON HCL 4 MG/2ML IJ SOLN
INTRAMUSCULAR | Status: DC | PRN
  Administered 2022-02-24: 4 mg via INTRAVENOUS

## 2022-02-24 NOTE — Anesthesia Postprocedure Evaluation (Signed)
Anesthesia Post Note  Patient: Jamie Hansen  Procedure(s) Performed: UPPER ESOPHAGEAL ENDOSCOPIC ULTRASOUND (EUS) ENDOSCOPIC RETROGRADE CHOLANGIOPANCREATOGRAPHY (ERCP) ESOPHAGOGASTRODUODENOSCOPY (EGD) BIOPSY REMOVAL OF STONES     Patient location during evaluation: Endoscopy Anesthesia Type: General Level of consciousness: sedated Pain management: pain level controlled Vital Signs Assessment: post-procedure vital signs reviewed and stable Respiratory status: spontaneous breathing Cardiovascular status: stable Postop Assessment: no apparent nausea or vomiting Anesthetic complications: no  No notable events documented.  Last Vitals:  Vitals:   02/24/22 1412 02/24/22 1420  BP: 126/87 131/80  Pulse: 100 75  Resp: 13 15  Temp:    SpO2: 100% 99%    Last Pain:  Vitals:   02/24/22 1420  TempSrc:   PainSc: (P) Asleep   Pain Goal: Patients Stated Pain Goal: 5 (02/24/22 1219)                 Huston Foley

## 2022-02-24 NOTE — H&P (View-Only) (Signed)
[facility] Progress Note  Jamie Hansen 26 y.o. 03/06/1995   Subjective: Pain not as bad today as yesterday. Feels ok.  Objective: Vital signs: Vitals:   02/24/22 0201 02/24/22 0618  BP: 110/66 110/73  Pulse: (!) 105 73  Resp: 17 17  Temp: 99.6 F (37.6 Jamie) 99.2 F (37.3 Jamie)  SpO2: 96% 100%    Physical Exam: Gen: lethargic, no acute distress, well-nourished HEENT: anicteric sclera CV: RRR Chest: CTA B Abd: RUQ tenderness (less tender), soft, nondistended, +BS Ext: no edema  Lab Results: Recent Labs    02/23/22 0342 02/24/22 0348  NA 130* 134*  K 2.9* 3.3*  CL 102 104  CO2 19* 19*  GLUCOSE 95 72  BUN 8 6  CREATININE 0.66 0.61  CALCIUM 7.5* 7.9*  MG  --  1.7   Recent Labs    02/23/22 0342 02/24/22 0348  AST 29 19  ALT 47* 33  ALKPHOS 59 48  BILITOT 1.9* 1.4*  PROT 6.9 6.2*  ALBUMIN 3.7 3.1*   Recent Labs    02/22/22 1521 02/23/22 0342 02/24/22 0348  WBC 16.1* 9.8 7.2  NEUTROABS 14.6*  --   --   HGB 14.1 11.9* 11.6*  HCT 40.5 35.1* 32.9*  MCV 83.2 84.0 84.6  PLT 191 146* 141*      Assessment/Plan: RUQ pain with elevated LFTs - question biliary sludge vs retained stone. NPO. EUS and ERCP today by Dr. Mansouraty. Supportive care.   Jamie Hansen Jamie Hansen 02/24/2022, 10:08 AM  Questions please call [phone] ID: Jamie Hansen, female   DOB: 03/04/1995, 26 y.o.   MRN: [id number]  

## 2022-02-24 NOTE — Anesthesia Procedure Notes (Signed)
Procedure Name: Intubation Date/Time: 02/24/2022 12:59 PM  Performed by: Claudia Desanctis, CRNAPre-anesthesia Checklist: Patient identified, Emergency Drugs available, Suction available and Patient being monitored Patient Re-evaluated:Patient Re-evaluated prior to induction Oxygen Delivery Method: Circle system utilized Preoxygenation: Pre-oxygenation with 100% oxygen Induction Type: IV induction Ventilation: Mask ventilation without difficulty Laryngoscope Size: 2 and Miller Grade View: Grade I Tube type: Oral Tube size: 7.0 mm Number of attempts: 1 Airway Equipment and Method: Stylet Placement Confirmation: ETT inserted through vocal cords under direct vision, positive ETCO2 and breath sounds checked- equal and bilateral Secured at: 21 cm Tube secured with: Tape Dental Injury: Teeth and Oropharynx as per pre-operative assessment

## 2022-02-24 NOTE — Anesthesia Preprocedure Evaluation (Signed)
Anesthesia Evaluation  Patient identified by MRN, date of birth, ID band Patient awake    Reviewed: Allergy & Precautions, NPO status , Patient's Chart, lab work & pertinent test results  History of Anesthesia Complications Negative for: history of anesthetic complications  Airway Mallampati: I       Dental  (+) Teeth Intact, Dental Advisory Given   Pulmonary former smoker   Pulmonary exam normal        Cardiovascular Normal cardiovascular exam     Neuro/Psych negative neurological ROS     GI/Hepatic Neg liver ROS,,,Cholecystitis with choledocholithiasis, s/p cholecystectomy   Endo/Other  negative endocrine ROS    Renal/GU negative Renal ROS  negative genitourinary   Musculoskeletal negative musculoskeletal ROS (+)    Abdominal Normal abdominal exam  (+)   Peds  Hematology  (+) Blood dyscrasia, anemia   Anesthesia Other Findings   Reproductive/Obstetrics negative OB ROS                             Anesthesia Physical Anesthesia Plan  ASA: 2  Anesthesia Plan: General   Post-op Pain Management: Minimal or no pain anticipated   Induction: Intravenous  PONV Risk Score and Plan: 3 and Ondansetron, Dexamethasone, Treatment may vary due to age or medical condition and Midazolam  Airway Management Planned: Oral ETT  Additional Equipment: None  Intra-op Plan:   Post-operative Plan: Extubation in OR  Informed Consent: I have reviewed the patients History and Physical, chart, labs and discussed the procedure including the risks, benefits and alternatives for the proposed anesthesia with the patient or authorized representative who has indicated his/her understanding and acceptance.     Dental advisory given  Plan Discussed with: CRNA  Anesthesia Plan Comments:        Anesthesia Quick Evaluation

## 2022-02-24 NOTE — Plan of Care (Signed)
  Problem: Clinical Measurements: Goal: Ability to maintain clinical measurements within normal limits will improve Outcome: Progressing Goal: Diagnostic test results will improve Outcome: Progressing   Problem: Activity: Goal: Risk for activity intolerance will decrease Outcome: Progressing   

## 2022-02-24 NOTE — Progress Notes (Signed)
[facility] Progress Note Patient: Jamie Hansen IRJ:[id number] DOB: [birth year] DOA: 02/22/2022  DOS: the patient was seen and examined on 02/24/2022  Brief hospital course: PMH of bipolar disorder, anxiety, recent cholecystectomy, ERCP and ERCP pancreatitis present to the hospital complains of RUQ pain.  Found to have hyperbilirubinemia with elevated LFT and MRCP showing dilated bile ducts. [facility] was consulted who discussed with Dr. Paulita Fujita and Dr. Rush Landmark and plan is for EUS and ERCP on 1/3. Assessment and Plan: Biliary colic. Recent lap chole cystectomy and ERCP. ERCP pancreatitis. Presents with abdominal pain. CT abdomen showed dilated biliary ducts and pneumobilia. MRCP shows no filling defect but biliary ductal distention concerning for an occult intraductal calculi. Currently plan for ERCP EUS on 1/3. Pain is controlled no nausea no vomiting no weight plan as per GI  Transaminitis. Hyperbilirubinemia. Currently improving with hydration. Monitor.  Leukocytosis. Likely stress reaction--is now resolved  Hypokalemia Replace with K to 40 and check magnesium in a.m.  Left ovarian mass. Possible dermoid. Increased in size since ultrasound performed on 07/11/2016. Outpatient follow-up with GYN recommended.   Subjective:   Patient interviewed without interpreter and looks comfortable tells me no vomiting since day before yesterday passing gas Is n.p.o. for procedure, pain seems moderate only  Physical Exam: Awake coherent pleasant no distress no icterus no pallor no nausea no vomiting S1-S2 no murmur Slight right upper quadrant discomfort No lower extremity edema  Data Reviewed: I have Reviewed nursing notes, Vitals, and Lab results. Since last encounter, pertinent lab results CBC and BMP   . I have ordered test including CBC and CMP on the same  . I have discussed pt's care plan and test results with [facility]  .   Disposition: Status is:  Inpatient Remains inpatient appropriate because: Need for intervention for biliary colic.  SCDs Start: 02/22/22 2350   Family Communication: No one at bedside Level of care: Med-Surg  Vitals:   02/23/22 1729 02/23/22 2204 02/24/22 0201 02/24/22 0618  BP: 121/76 118/81 110/66 110/73  Pulse: 88 84 (!) 105 73  Resp: 18 17 17 17   Temp: 98.3 F (36.8 C) 98.3 F (36.8 C) 99.6 F (37.6 C) 99.2 F (37.3 C)  TempSrc:  Oral Oral Oral  SpO2: 100% 100% 96% 100%  Weight:      Height:         Author: Nita Sells, MD 02/24/2022 11:20 AM  Please look on [email or web address] to find out who is on call.

## 2022-02-24 NOTE — Transfer of Care (Signed)
Immediate Anesthesia Transfer of Care Note  Patient: Jamie Hansen  Procedure(s) Performed: UPPER ESOPHAGEAL ENDOSCOPIC ULTRASOUND (EUS) ENDOSCOPIC RETROGRADE CHOLANGIOPANCREATOGRAPHY (ERCP) ESOPHAGOGASTRODUODENOSCOPY (EGD) BIOPSY REMOVAL OF STONES  Patient Location: Endoscopy Unit  Anesthesia Type:General  Level of Consciousness: awake and patient cooperative  Airway & Oxygen Therapy: Patient Spontanous Breathing and Patient connected to face mask  Post-op Assessment: Report given to RN and Post -op Vital signs reviewed and stable  Post vital signs: Reviewed and stable  Last Vitals:  Vitals Value Taken Time  BP 126/87 02/24/22 1411  Temp    Pulse 92 02/24/22 1412  Resp 11 02/24/22 1412  SpO2 100 % 02/24/22 1412  Vitals shown include unvalidated device data.  Last Pain:  Vitals:   02/24/22 1219  TempSrc: Temporal  PainSc: 2       Patients Stated Pain Goal: 5 (16/10/96 0454)  Complications: No notable events documented.

## 2022-02-24 NOTE — Op Note (Signed)
[facility] Patient Name: Jamie Hansen Procedure Date: 02/24/2022 MRN: [id number] Attending MD: Justice Britain , MD, [id number] Date of Birth: 05/27/1995 CSN: [id number] Age: 27 Admit Type: Inpatient Procedure:                ERCP Indications:              Common bile duct stone(s), Abnormal MRCP, Abnormal                            endoscopic ultrasound of the biliary system,                            Elevated liver enzymes Providers:                Justice Britain, MD, Dulcy Fanny, Darliss Cheney, Technician, Dellie Catholic Referring MD:             [name], MD, Inpatient Medical Service Medicines:                General Anesthesia, Cipro 400 mg IV, Diclofenac 100                            mg rectal Complications:            No immediate complications. Estimated Blood Loss:     Estimated blood loss was minimal. Procedure:                Pre-Anesthesia Assessment:                           - Prior to the procedure, a History and Physical                            was performed, and patient medications and                            allergies were reviewed. The patient's tolerance of                            previous anesthesia was also reviewed. The risks                            and benefits of the procedure and the sedation                            options and risks were discussed with the patient.                            All questions were answered, and informed consent                            was obtained. Prior Anticoagulants: The patient has  taken no anticoagulant or antiplatelet agents. ASA                            Grade Assessment: II - A patient with mild systemic                            disease. After reviewing the risks and benefits,                            the patient was deemed in satisfactory condition to                            undergo the  procedure.                           After obtaining informed consent, the scope was                            passed under direct vision. Throughout the                            procedure, the patient's blood pressure, pulse, and                            oxygen saturations were monitored continuously. The                            [id number]-Q190V ([id number]) Olympus duodenoscope was                            introduced through the mouth, and used to inject                            contrast into and used to inject contrast into the                            bile duct. The ERCP was accomplished without                            difficulty. The patient tolerated the procedure.                            Unfortunately, we had multiple images not capture                            during this procedure in [place]. Scope In: Scope Out: Findings:      A scout film of the abdomen was obtained. Surgical clips, consistent       with a previous cholecystectomy, were seen in the area of the right       upper quadrant of the abdomen.      The esophagus was successfully intubated under direct vision without       detailed examination of the pharynx, larynx, and associated structures,       and upper GI  tract. A biliary sphincterotomy had been performed. The       sphincterotomy appeared open.      A short 0.035 inch Soft Jagwire was passed into the biliary tree. The       Hydratome sphincterotome was passed over the guidewire and the bile duct       was then deeply cannulated. Contrast was injected. I personally       interpreted the bile duct images. Ductal flow of contrast was adequate.       Image quality was adequate. Contrast extended to the hepatic ducts.       Opacification of the entire biliary tree except for the cystic duct and       gallbladder was successful. The maximum diameter of the ducts was 10 mm.       To discover objects, the biliary tree was swept with a retrieval        balloon. Very small amount of sludge was swept from the duct. An       occlusion cholangiogram was performed that showed no further significant       biliary pathology. I do think that the CBD and the bifurcation and the       right and left hepatic ducts are all clear of any debris/sludge. The       secondary branch intrahepatics are normal in appearance without evidence       of rarefaction on today's evaluation.      A pancreatogram was not performed.      The duodenoscope was withdrawn from the patient. Impression:               - Prior biliary sphincterotomy appeared open.                           - The biliary tree was swept and small amount of                            sludge was found.                           - No evidence of rarefaction of the secondary                            intrahepatics. Biliary dilation noted but no                            evidence of significant blockage/stenosis is noted. Moderate Sedation:      Not Applicable - Patient had care per Anesthesia. Recommendation:           - The patient will be observed post-procedure,                            until all discharge criteria are met.                           - Return patient to hospital ward for ongoing care.                           - Check liver enzymes (AST, ALT, alkaline  phosphatase, bilirubin) in the morning.                           - Watch for pancreatitis, bleeding, perforation,                            and cholangitis.                           - See EUS for additional recommendations based on                            endoscopic evaluation of the Upper GI tract.                           - Further follow up as per Inpatient GI Team [facility]                            GI).                           - The findings and recommendations were discussed                            with the patient.                           - The findings and recommendations were  discussed                            with the referring physician. Procedure Code(s):        --- Professional ---                           [phone], Endoscopic retrograde                            cholangiopancreatography (ERCP); with removal of                            calculi/debris from biliary/pancreatic duct(s)                           [phone], Endoscopic catheterization of the biliary                            ductal system, radiological supervision and                            interpretation Diagnosis Code(s):        --- Professional ---                           K80.50, Calculus of bile duct without cholangitis                            or cholecystitis without obstruction  R93.2, Abnormal findings on diagnostic imaging of                            liver and biliary tract                           R74.8, Abnormal levels of other serum enzymes CPT copyright 2022 American Medical Association. All rights reserved. The codes documented in this report are preliminary and upon coder review may  be revised to meet current compliance requirements. Justice Britain, MD 02/24/2022 2:17:12 PM Number of Addenda: 0

## 2022-02-24 NOTE — Interval H&P Note (Signed)
History and Physical Interval Note:  02/24/2022 12:29 PM  Jamie Hansen  has presented today for surgery, with the diagnosis of abd pain, elevated lft.  The various methods of treatment have been discussed with the patient and family. After consideration of risks, benefits and other options for treatment, the patient has consented to  Procedure(s): UPPER ESOPHAGEAL ENDOSCOPIC ULTRASOUND (EUS) (N/A) ENDOSCOPIC RETROGRADE CHOLANGIOPANCREATOGRAPHY (ERCP) (N/A) as a surgical intervention.  The patient's history has been reviewed, patient examined, no change in status, stable for surgery.  I have reviewed the patient's chart and labs.  Questions were answered to the patient's satisfaction.     Case discussed in depth with Dr. Michail Sermon.  Imaging reviewed from pre-[facility] and from ERCP and recent MRI/MRCP.  In setting of progressive ductal dilation, which may be now due to post-cholecystectomy status, but with abnormal LFTs on presentation, that is consistent with her previous history of choledocholithiasis, I think it is reasonable to perform EUS/ERCP to evaluate the biliary tree and the abnormal MRI.  The previous cholangiogram, done at time of ERCP, showed similar findings to the recent MRICP, so I do not think there is a new secondary process, but with the potential for retained stone reasonable to pursue these procedures.  To help this patient, and the [facility] GI team, I will pursue EUS/ERCP.  The risks of an EUS including intestinal perforation, bleeding, infection, aspiration, and medication effects were discussed as was the possibility it may not give a definitive diagnosis if a biopsy is performed.  When a biopsy of the pancreas is done as part of the EUS, there is an additional risk of pancreatitis at the rate of about 1-2%.  It was explained that procedure related pancreatitis is typically mild, although it can be severe and even life threatening, which is why we do not perform random pancreatic  biopsies and only biopsy a lesion/area we feel is concerning enough to warrant the risk.  The risks of an ERCP were discussed at length, including but not limited to the risk of perforation, bleeding, abdominal pain, post-ERCP pancreatitis (while usually mild can be severe and even life threatening).    [employer]

## 2022-02-24 NOTE — Progress Notes (Signed)
[facility] Progress Note  Jamie Hansen 27 y.o. Feb 22, 1996   Subjective: Pain not as bad today as yesterday. Feels ok.  Objective: Vital signs: Vitals:   02/24/22 0201 02/24/22 0618  BP: 110/66 110/73  Pulse: (!) 105 73  Resp: 17 17  Temp: 99.6 F (37.6 C) 99.2 F (37.3 C)  SpO2: 96% 100%    Physical Exam: Gen: lethargic, no acute distress, well-nourished HEENT: anicteric sclera CV: RRR Chest: CTA B Abd: RUQ tenderness (less tender), soft, nondistended, +BS Ext: no edema  Lab Results: Recent Labs    02/23/22 0342 02/24/22 0348  NA 130* 134*  K 2.9* 3.3*  CL 102 104  CO2 19* 19*  GLUCOSE 95 72  BUN 8 6  CREATININE 0.66 0.61  CALCIUM 7.5* 7.9*  MG  --  1.7   Recent Labs    02/23/22 0342 02/24/22 0348  AST 29 19  ALT 47* 33  ALKPHOS 59 48  BILITOT 1.9* 1.4*  PROT 6.9 6.2*  ALBUMIN 3.7 3.1*   Recent Labs    02/22/22 1521 02/23/22 0342 02/24/22 0348  WBC 16.1* 9.8 7.2  NEUTROABS 14.6*  --   --   HGB 14.1 11.9* 11.6*  HCT 40.5 35.1* 32.9*  MCV 83.2 84.0 84.6  PLT 191 146* 141*      Assessment/Plan: RUQ pain with elevated LFTs - question biliary sludge vs retained stone. NPO. EUS and ERCP today by Dr. Rush Landmark. Supportive care.   [name] 02/24/2022, 10:08 AM  Questions please call [phone] ID: Jamie Hansen, female   DOB: 1995/11/19, 27 y.o.   MRN: [id number]

## 2022-02-24 NOTE — Plan of Care (Signed)
Plan of care reviewed and discussed. °

## 2022-02-24 NOTE — Op Note (Signed)
[facility] Patient Name: Jamie Hansen Procedure Date: 02/24/2022 MRN: [id number] Attending MD: Corliss Parish , MD, [id number] Date of Birth: 16-Nov-1995 CSN: [id number] Age: 27 Admit Type: Inpatient Procedure:                Upper EUS Indications:              Common bile duct dilation (acquired) seen on MRCP,                            Abnormal MRCP, Abnormal liver function test,                            Suspected choledocholithiasis, Epigastric abdominal                            pain, Abdominal pain in the right upper quadrant Providers:                Corliss Parish, MD, Fransisca Connors, Irene Shipper, Technician, Nadene Rubins Referring MD:             [name], MD Medicines:                General Anesthesia Complications:            No immediate complications. Estimated Blood Loss:     Estimated blood loss was minimal. Procedure:                Pre-Anesthesia Assessment:                           - Prior to the procedure, a History and Physical                            was performed, and patient medications and                            allergies were reviewed. The patient's tolerance of                            previous anesthesia was also reviewed. The risks                            and benefits of the procedure and the sedation                            options and risks were discussed with the patient.                            All questions were answered, and informed consent                            was obtained. Prior Anticoagulants: The patient has  taken no anticoagulant or antiplatelet agents. ASA                            Grade Assessment: II - A patient with mild systemic                            disease. After reviewing the risks and benefits,                            the patient was deemed in satisfactory condition to                             undergo the procedure.                           After obtaining informed consent, the endoscope was                            passed under direct vision. Throughout the                            procedure, the patient's blood pressure, pulse, and                            oxygen saturations were monitored continuously. The                            [id number]-H190 ([id number] Olympus endoscope was introduced                            through the mouth, and advanced to the second part                            of duodenum. The TJF-Q190V ([id number] Olympus                            duodenoscope was introduced through the mouth, and                            advanced to the area of papilla. The [id number]                            ([id number]) Olympus linear ultrasound scope was                            introduced through the mouth, and advanced to the                            duodenum for ultrasound examination from the                            stomach and duodenum. The upper EUS was  accomplished without difficulty. The patient                            tolerated the procedure. Scope In: Scope Out: Findings:      ENDOSCOPIC FINDING: :      No gross lesions were noted in the entire esophagus.      The Z-line was regular and was found 37 cm from the incisors.      Patchy mild inflammation characterized by erosions and erythema was       found in the gastric antrum.      No other gross lesions were noted in the entire examined stomach.       Biopsies were taken with a cold forceps for histology and Helicobacter       pylori testing.      No gross lesions were noted in the duodenal bulb, in the first portion       of the duodenum and in the second portion of the duodenum. Biopsies were       taken with a cold forceps for histology.      There was evidence of a patent sphincterotomy in the major papilla.      ENDOSONOGRAPHIC FINDING: :      There was dilation in  the common bile duct which measured up to 8 mm.      A small amount of hyperechoic and shadowing material consistent with       pneumobilia vs sludge was visualized endosonographically in the common       bile duct. Most of the EUS suggested linear hyperechoic areas which are       more likely pneumobilia, however, there was a 3 mm by 4 mm rounded       hyperechoic/shadowing defect noted as well concerning for potential       stone.      There was no sign of significant endosonographic abnormality in the       pancreatic head (PD - 1.5 mm), genu of the pancreas (PD - 1.3 mm),       pancreatic body (PD - 1.1 mm) and pancreatic tail (PD - 1.0 mm). No       masses, no cysts, no calcifications, the pancreatic duct was regular in       contour.      Endosonographic imaging in the visualized portion of the liver showed no       mass.      No malignant-appearing lymph nodes were visualized in the celiac region       (level 20), peripancreatic region and porta hepatis region.      The celiac region was visualized. Impression:               EGD Impression:                           - No gross lesions in the entire esophagus. Z-line                            regular, 37 cm from the incisors.                           - Gastritis in antrum. No other gross lesions in  the entire stomach. Biopsied.                           - No gross lesions in the duodenal bulb, in the                            first portion of the duodenum and in the second                            portion of the duodenum. Biopsied.                           - Patent sphincterotomy was found.                           EUS Impression:                           - There was dilation in the common bile duct which                            measured up to 8 mm.                           - Hyperechoic/shadowing material consistent with                            sludge v pneumobilia v retianed small stone  was                            visualized endosonographically in the common bile                            duct. The potential small stone was rounded rather                            than the majority of what appeared to be more                            linear echoshadows.                           - There was no sign of significant pathology in the                            pancreatic head, genu of the pancreas, pancreatic                            body and pancreatic tail.                           - No malignant-appearing lymph nodes were                            visualized in the celiac region (  level 20),                            peripancreatic region and porta hepatis region. Moderate Sedation:      Not Applicable - Patient had care per Anesthesia. Recommendation:           - Proceed to scheduled ERCP.                           - Observe patient's clinical course.                           - Await path results.                           - Start PPI 40 mg daily.                           - The findings and recommendations were discussed                            with the patient.                           - The findings and recommendations were discussed                            with the referring physician. Procedure Code(s):        --- Professional ---                           [phone], Esophagogastroduodenoscopy, flexible,                            transoral; with endoscopic ultrasound examination                            limited to the esophagus, stomach or duodenum, and                            adjacent structures                           [id number], Esophagogastroduodenoscopy, flexible,                            transoral; with biopsy, single or multiple Diagnosis Code(s):        --- Professional ---                           K29.70, Gastritis, unspecified, without bleeding                           Z98.890, Other specified postprocedural states                            K83.8, Other specified diseases of biliary tract  I89.9, Noninfective disorder of lymphatic vessels                            and lymph nodes, unspecified                           R79.89, Other specified abnormal findings of blood                            chemistry                           R10.13, Epigastric pain                           R10.11, Right upper quadrant pain                           R93.2, Abnormal findings on diagnostic imaging of                            liver and biliary tract CPT copyright 2022 American Medical Association. All rights reserved. The codes documented in this report are preliminary and upon coder review may  be revised to meet current compliance requirements. Corliss Parish, MD 02/24/2022 1:41:18 PM Number of Addenda: 0

## 2022-02-25 ENCOUNTER — Encounter: Payer: Self-pay | Admitting: Family Medicine

## 2022-02-25 DIAGNOSIS — K8050 Calculus of bile duct without cholangitis or cholecystitis without obstruction: Secondary | ICD-10-CM | POA: Diagnosis not present

## 2022-02-25 LAB — SURGICAL PATHOLOGY

## 2022-02-25 LAB — COMPREHENSIVE METABOLIC PANEL
ALT: 33 U/L (ref 0–44)
AST: 21 U/L (ref 15–41)
Albumin: 3.3 g/dL — ABNORMAL LOW (ref 3.5–5.0)
Alkaline Phosphatase: 70 U/L (ref 38–126)
Anion gap: 8 (ref 5–15)
BUN: 8 mg/dL (ref 6–20)
CO2: 22 mmol/L (ref 22–32)
Calcium: 8.5 mg/dL — ABNORMAL LOW (ref 8.9–10.3)
Chloride: 107 mmol/L (ref 98–111)
Creatinine, Ser: 0.71 mg/dL (ref 0.44–1.00)
GFR, Estimated: >60 mL/min (ref >60)
Glucose, Bld: 170 mg/dL — ABNORMAL HIGH (ref 70–99)
Potassium: 3.6 mmol/L (ref 3.5–5.1)
Sodium: 137 mmol/L (ref 135–145)
Total Bilirubin: 0.6 mg/dL (ref 0.3–1.2)
Total Protein: 7 g/dL (ref 6.5–8.1)

## 2022-02-25 LAB — MAGNESIUM: Magnesium: 1.9 mg/dL (ref 1.7–2.4)

## 2022-02-25 MED ORDER — OXYCODONE HCL 5 MG PO TABS: 5.0000 mg | ORAL_TABLET | Freq: Four times a day (QID) | ORAL | 0 refills | Status: DC | PRN

## 2022-02-25 MED ORDER — ONDANSETRON HCL 4 MG PO TABS: 4.0000 mg | ORAL_TABLET | Freq: Every day | ORAL | 1 refills | Status: DC | PRN

## 2022-02-25 NOTE — Plan of Care (Signed)
Discharge instructions given to the patient including medications. ?

## 2022-02-25 NOTE — Discharge Summary (Signed)
Physician Discharge Summary  Jamie Hansen WNI:[id number] DOB: 02-03-1996 DOA: 02/22/2022  PCP: Patient, No Pcp Per  Admit date: 02/22/2022 Discharge date: 02/25/2022  Time spent: 26 minutes  Recommendations for Outpatient Follow-up:  Needs Chem-12 CBC in about 1 week Should follow-up with GI in the outpatient setting Limited prescription of Zofran, Oxy IR given on discharge Outpatient characterization when able with a ultrasound presented to GYN physician  Discharge Diagnoses:  MAIN problem for hospitalization   Pancreatitis worsening  diarrhea Cholecystectomy recently acalculous   Please see below for itemized issues addressed in [place]- refer to other progress notes for clarity if needed  Discharge Condition: Improved  Diet recommendation: Regular  Filed Weights   02/23/22 0036  Weight: 58.2 kg    History of present illness:  27 y/o fem bipolar disorder, anxiety, recent cholecystectomy, ERCP and ERCP pancreatitis present to the hospital complains of RUQ pain.  Found to have hyperbilirubinemia with elevated LFT and MRCP showing dilated bile ducts. [facility] was consulted who discussed with Dr. Paulita Fujita and Dr. Rush Landmark and plan is for EUS and ERCP on 1/3.  EUS and ERCP did not show any specific blockages only dilated ducts in keeping with recent instrumentation, patient also had mild sludge but nothing significant and was graduated to regular diet  Hospital Course:  Biliary colic. Recent lap chole cystectomy and ERCP. ERCP pancreatitis. Presents with abdominal pain. CT abdomen showed dilated biliary ducts and pneumobilia. MRCP shows no filling defect but biliary ductal distention concerning for an occult intraductal calculi. Underwent ERCP EUS as above--no specific findings suggestive of any etiology Sending home on Zofran and pain meds   Transaminitis. Hyperbilirubinemia. Currently resolved   Leukocytosis. Likely stress reaction--is now resolved    Hypokalemia Resolved on discharge no need for outpatient follow-up   Left ovarian mass. Possible dermoid. Increased in size since ultrasound performed on 07/11/2016. Outpatient follow-up with GYN recommended.   Discharge Exam: Vitals:   02/24/22 2249 02/25/22 0605  BP: 115/74 103/63  Pulse: 73 69  Resp: 16 16  Temp: 98.5 F (36.9 C) 98 F (36.7 C)  SpO2: 100% 100%    Subj on day of d/c   Awake alert coherent no distress looks comfortable eating full breakfast no chest pain no fever No nausea no vomiting  General Exam on discharge  EOMI NCAT no focal deficit no icterus no pallor no rales no rhonchi ROM intact no focal deficit No lower extremity edema Abdomen soft no rebound no guarding  Discharge Instructions   Discharge Instructions     Diet - low sodium heart healthy   Complete by: As directed    Discharge instructions   Complete by: As directed    Please take ibuprofen for mild pain and oxycodone for severe pain if you have it We will also call in a medication for nausea  If you have severe nausea vomiting or abdominal pain please present your back to the emergency room Get a visit with your primary physician in about 1 week  You may return to work on 03/01/2022 without any restrictions   Increase activity slowly   Complete by: As directed       Allergies as of 02/25/2022       Reactions   Latex Itching, Other (See Comments)   Itching and yeast like symptoms with latex condom use.        Medication List     TAKE these medications    ibuprofen 200 MG tablet Commonly known as: ADVIL  Take 200 mg by mouth every 6 (six) hours as needed for mild pain or fever.   oxyCODONE 5 MG immediate release tablet Commonly known as: Oxy IR/ROXICODONE Take 1 tablet (5 mg total) by mouth every 6 (six) hours as needed for severe pain.       Allergies  Allergen Reactions   Latex Itching and Other (See Comments)    Itching and yeast like symptoms with latex  condom use.      The results of significant diagnostics from this hospitalization (including imaging, microbiology, ancillary and laboratory) are listed below for reference.    Significant Diagnostic Studies: DG ERCP  Result Date: 02/24/2022 CLINICAL DATA:  History of choledocholithiasis and elevated liver enzymes. EXAM: ERCP TECHNIQUE: Multiple spot images obtained with the fluoroscopic device and submitted for interpretation post-procedure. FLUOROSCOPY: Radiation Exposure Index (as provided by the fluoroscopic device): 42.81 mGy Kerma COMPARISON:  ERCP 01/02/2022 FINDINGS: Retrograde cholangiogram was performed with a balloon sweep. Mild dilatation of the extrahepatic bile duct. No large filling defects or stones. Status post cholecystectomy. IMPRESSION: Mild dilatation of the biliary system without large stones or filling defects. These images were submitted for radiologic interpretation only. Please see the procedural report for the amount of contrast utilized. Electronically Signed   By: Richarda Overlie M.D.   On: 02/24/2022 14:37   MR 3D Recon At Scanner  Result Date: 02/23/2022 CLINICAL DATA:  MRCP images acquired as part of the abdominal evaluation on February 23, 2022. Please refer to this report for additional information. EXAM: 3-DIMENSIONAL MR IMAGE RENDERING ON ACQUISITION WORKSTATION TECHNIQUE: 3-dimensional MR images were rendered by post-processing of the original MR data on an acquisition workstation. The 3-dimensional MR images were interpreted and findings were reported in the accompanying complete MR report for this study COMPARISON:  Abdominal MRI, current 3D rendering of the biliary tree provided as part of this exam. FINDINGS: Please see dedicated report for details. 3D rendering of the biliary system was performed at a dedicated workstation and utilized for interpretation of this study. IMPRESSION: See dedicated report. Electronically Signed   By: Donzetta Kohut M.D.   On: 02/23/2022  10:08   MR ABDOMEN MRCP WO CONTRAST  Result Date: 02/23/2022 CLINICAL DATA:  Suspected biliary obstruction. Post recent cholecystectomy in November of 2023. EXAM: MRI ABDOMEN WITHOUT CONTRAST  (INCLUDING MRCP) TECHNIQUE: Multiplanar multisequence MR imaging of the abdomen was performed. Heavily T2-weighted images of the biliary and pancreatic ducts were obtained, and three-dimensional MRCP images were rendered by post processing. COMPARISON:  Abdominal sonogram and CT imaging from December 31, 2021. Abdominal CT from February 22, 2022. FINDINGS: Lower chest: Incidental imaging of the lung bases with mild basilar atelectasis. No gross effusion. Limited assessment of the lung bases on this abdominal MRI. Hepatobiliary: Post cholecystectomy. Moderate pneumobilia involving the biliary tree similar to previous imaging though slightly increased on the RIGHT. Segmental biliary duct dilation of anterior division RIGHT hepatic ducts 2 hepatic subsegment VIII are not well assessed due to the presence of susceptibility artifact from pneumobilia. Likewise just below the biliary confluence is not well assessed due to susceptibility from clip artifact following cholecystectomy. No definitive signs of filling defect to suggest biliary calculus within the common bile duct. Pancreas: Pancreas with normal T1 signal and without signs of inflammation or ductal dilation. Spleen: Normal size and contour. No visible splenic abnormality on noncontrast imaging. Adrenals/Urinary Tract: Adrenal glands and kidneys unremarkable on noncontrast imaging. Stomach/Bowel: No acute gastrointestinal findings to the extent evaluated. Vascular/Lymphatic: Normal caliber  of abdominal vasculature without signs of adenopathy in the abdomen. Limited assessment due to lack of intravenous contrast. Other: LEFT ovarian dermoid not well assessed seen incidentally on coronal images. Ovary at nearly 5 cm with peripheral follicles but without adjacent stranding.  Musculoskeletal: No suspicious bone lesions identified. IMPRESSION: 1. Moderate pneumobilia involving the biliary tree similar to previous imaging though slightly increased on the RIGHT. No definitive signs of filling defect to suggest biliary calculus within the common bile duct. Pneumobilia may be due to recent stone extraction. Would also correlate with any signs of cholangitis. 2. Segmental biliary duct distension of anterior division RIGHT hepatic ducts, findings are nonspecific but un usual given that secondary and tertiary biliary branches are seen up to 9-10 mm. Perhaps this is related to stricture of branches or sequela of prior infection, this would be the most common cause in a patient of this age. Occult intraductal calculi within the hepatic ducts are another possibility with neoplasm felt less likely given patient age. Follow-up MRCP with and without contrast in the short interval or comparison with prior your CP or ERCP follow-up as warranted is suggested. 3. LEFT ovarian dermoid not well assessed seen incidentally on coronal images. Ovary nearly 5 cm with peripheral follicles but without adjacent stranding, size is similar to recent CT. Correlate with any LEFT lower quadrant symptoms and with pelvic sonogram as warranted. Electronically Signed   By: Zetta Bills M.D.   On: 02/23/2022 09:39   CT ABDOMEN PELVIS W CONTRAST  Result Date: 02/22/2022 CLINICAL DATA:  Biliary obstruction suspected. Patient has had her gallbladder removed 01/01/2022. EXAM: CT ABDOMEN AND PELVIS WITH CONTRAST TECHNIQUE: Multidetector CT imaging of the abdomen and pelvis was performed using the standard protocol following bolus administration of intravenous contrast. RADIATION DOSE REDUCTION: This exam was performed according to the departmental dose-optimization program which includes automated exposure control, adjustment of the mA and/or kV according to patient size and/or use of iterative reconstruction technique.  CONTRAST:  111mL OMNIPAQUE IOHEXOL 300 MG/ML  SOLN COMPARISON:  ERCP dated January 02, 2022; CT examination dated December 31, 2021 FINDINGS: Lower chest: Bibasilar dependent atelectasis.  No acute abnormality. Hepatobiliary: No focal liver abnormality is seen. Status post cholecystectomy. Mildly dilated biliary ducts, unchanged from prior CT examination. Pneumobilia in the central biliary ducts, likely secondary to recent intervention or sphincter of Oddi dysfunction. No fluid collection. Pancreas: Unremarkable. No pancreatic ductal dilatation or surrounding inflammatory changes. Spleen: Normal in size without focal abnormality. Adrenals/Urinary Tract: Adrenal glands are unremarkable. Kidneys are normal, without renal calculi, focal lesion, or hydronephrosis. Bladder is unremarkable. Stomach/Bowel: Stomach is within normal limits. Appendix appears normal. No evidence of bowel wall thickening, distention, or inflammatory changes. Vascular/Lymphatic: No significant vascular findings are present. No enlarged abdominal or pelvic lymph nodes. Reproductive: Uterus and right ovary are unremarkable. 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 Tubal ligation. Other: No abdominal wall hernia or abnormality. No abdominopelvic ascites. Musculoskeletal: No acute or significant osseous findings. IMPRESSION: 1. Status post cholecystectomy. Mildly dilated biliary ducts, unchanged from prior CT examination. Pneumobilia in the central biliary ducts, likely secondary to recent intervention or Sphincter of Oddi dysfunction. No fluid collection or abscess. 2. No CT evidence of acute abdominal/pelvic process. 3. 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 Electronically Signed   By: Keane Police D.O.   On: 02/22/2022 22:12   DG Chest Port 1 View  Result Date: 02/22/2022 CLINICAL DATA:  Questionable sepsis - evaluate for abnormality EXAM:  PORTABLE CHEST - 1 VIEW COMPARISON:  None Available. FINDINGS: Cardiac  silhouette is unremarkable. No pneumothorax or pleural effusion. The lungs are clear. The visualized skeletal structures are unremarkable. IMPRESSION: No acute cardiopulmonary process. Electronically Signed   By: Layla Maw M.D.   On: 02/22/2022 20:32    Microbiology: Recent Results (from the past 240 hour(s))  Urine Culture     Status: Abnormal   Collection Time: 02/22/22  3:11 PM   Specimen: Urine, Clean Catch  Result Value Ref Range Status   Specimen Description   Final    URINE, CLEAN CATCH Performed at [facility], [address] [place], [place] [address]    Special Requests   Final    NONE Performed at [facility], [address], [place], [place] [address]    Culture (A)  Final    <10,000 COLONIES/mL INSIGNIFICANT GROWTH Performed at [facility], [address], [place], [place] [address]    Report Status 02/23/2022 FINAL  Final  Blood Culture (routine x 2)     Status: None (Preliminary result)   Collection Time: 02/22/22  8:13 PM   Specimen: BLOOD  Result Value Ref Range Status   Specimen Description   Final    BLOOD LEFT ANTECUBITAL Performed at [facility], [address], [place], [place] [address]    Special Requests   Final    BOTTLES DRAWN AEROBIC AND ANAEROBIC Blood Culture adequate volume Performed at [facility], [address], [place], [place] [address]    Culture   Final    NO GROWTH 3 DAYS Performed at [facility], [address], [place], [place] [address]    Report Status PENDING  Incomplete  Blood Culture (routine x 2)     Status: None (Preliminary result)   Collection Time: 02/22/22 10:33 PM   Specimen: BLOOD  Result Value Ref Range Status   Specimen Description   Final    BLOOD RIGHT ANTECUBITAL Performed at [facility], [address] [place], [place] [address]    Special Requests   Final    BOTTLES DRAWN AEROBIC AND  ANAEROBIC Blood Culture adequate volume Performed at [facility], [address] [place], [place] [address]    Culture   Final    NO GROWTH 2 DAYS Performed at [facility], [address] [place], [place] [address]    Report Status PENDING  Incomplete     Labs: Basic Metabolic Panel: Recent Labs  Lab 02/22/22 1521 02/23/22 0342 02/24/22 0348 02/25/22 0329  NA 131* 130* 134* 137  K 3.9 2.9* 3.3* 3.6  CL 98 102 104 107  CO2 23 19* 19* 22  GLUCOSE 103* 95 72 170*  BUN 7 8 6 8   CREATININE 0.74 0.66 0.61 0.71  CALCIUM 8.7* 7.5* 7.9* 8.5*  MG  --   --  1.7 1.9   Liver Function Tests: Recent Labs  Lab 02/22/22 1521 02/23/22 0342 02/24/22 0348 02/25/22 0329  AST 42* 29 19 21   ALT 63* 47* 33 33  ALKPHOS 66 59 48 70  BILITOT 2.6* 1.9* 1.4* 0.6  PROT 7.4 6.9 6.2* 7.0  ALBUMIN 4.1 3.7 3.1* 3.3*   Recent Labs  Lab 02/22/22 1521  LIPASE 30   No results for input(s): "AMMONIA" in the last 168 hours. CBC: Recent Labs  Lab 02/22/22 1521 02/23/22 0342 02/24/22 0348  WBC 16.1* 9.8 7.2  NEUTROABS  14.6*  --   --   HGB 14.1 11.9* 11.6*  HCT 40.5 35.1* 32.9*  MCV 83.2 84.0 84.6  PLT 191 146* 141*   Cardiac Enzymes: No results for input(s): "CKTOTAL", "CKMB", "CKMBINDEX", "TROPONINI" in the last 168 hours. BNP: BNP (last 3 results) No results for input(s): "BNP" in the last 8760 hours.  ProBNP (last 3 results) No results for input(s): "PROBNP" in the last 8760 hours.  CBG: No results for input(s): "GLUCAP" in the last 168 hours.     Signed:  Rhetta Mura MD   [facility] 02/25/2022, 9:38 AM

## 2022-02-25 NOTE — Progress Notes (Signed)
[facility] Progress Note  Jamie Hansen 27 y.o. December 18, 1995   Subjective: Feels good. Denies abdominal pain/N/V. Tolerating food.  Objective: Vital signs: Vitals:   02/24/22 2249 02/25/22 0605  BP: 115/74 103/63  Pulse: 73 69  Resp: 16 16  Temp: 98.5 F (36.9 C) 98 F (36.7 C)  SpO2: 100% 100%    Physical Exam: Gen: alert, no acute distress, pleasant HEENT: anicteric sclera CV: RRR Chest: CTA B Abd: soft, nontender, nondistended, +BS Ext: no edema  Lab Results: Recent Labs    02/24/22 0348 02/25/22 0329  NA 134* 137  K 3.3* 3.6  CL 104 107  CO2 19* 22  GLUCOSE 72 170*  BUN 6 8  CREATININE 0.61 0.71  CALCIUM 7.9* 8.5*  MG 1.7 1.9   Recent Labs    02/24/22 0348 02/25/22 0329  AST 19 21  ALT 33 33  ALKPHOS 48 70  BILITOT 1.4* 0.6  PROT 6.2* 7.0  ALBUMIN 3.1* 3.3*   Recent Labs    02/22/22 1521 02/23/22 0342 02/24/22 0348  WBC 16.1* 9.8 7.2  NEUTROABS 14.6*  --   --   HGB 14.1 11.9* 11.6*  HCT 40.5 35.1* 32.9*  MCV 83.2 84.0 84.6  PLT 191 146* 141*      Assessment/Plan: Elevated LFTs - s/p ERCP/EUS with small amount of sludge noted. Doing well today. LFTs normal now. Reassurance given. [place] for d/c home. F/U with [facility] as needed.   Jamie Hansen 02/25/2022, 9:51 AM  Questions please call [phone] ID: Jamie Hansen, female   DOB: Oct 04, 1995, 27 y.o.   MRN: [id number]

## 2022-02-26 ENCOUNTER — Encounter: Payer: Self-pay | Admitting: Gastroenterology

## 2022-02-28 LAB — CULTURE, BLOOD (ROUTINE X 2)
Culture: NO GROWTH
Culture: NO GROWTH
Special Requests: ADEQUATE
Special Requests: ADEQUATE

## 2022-03-01 ENCOUNTER — Encounter (HOSPITAL_COMMUNITY): Payer: Self-pay | Admitting: Gastroenterology

## 2022-06-22 ENCOUNTER — Inpatient Hospital Stay (HOSPITAL_BASED_OUTPATIENT_CLINIC_OR_DEPARTMENT_OTHER)
Admission: EM | Admit: 2022-06-22 | Discharge: 2022-06-25 | DRG: 445 | Disposition: A | Discharge status: Home / Self Care | Payer: BC Managed Care – PPO | Attending: Internal Medicine | Admitting: Internal Medicine

## 2022-06-22 ENCOUNTER — Emergency Department (HOSPITAL_BASED_OUTPATIENT_CLINIC_OR_DEPARTMENT_OTHER): Payer: BC Managed Care – PPO

## 2022-06-22 ENCOUNTER — Other Ambulatory Visit: Payer: Self-pay

## 2022-06-22 ENCOUNTER — Encounter (HOSPITAL_BASED_OUTPATIENT_CLINIC_OR_DEPARTMENT_OTHER): Payer: Self-pay | Admitting: Pediatrics

## 2022-06-22 DIAGNOSIS — K8051 Calculus of bile duct without cholangitis or cholecystitis with obstruction: Secondary | ICD-10-CM | POA: Diagnosis present

## 2022-06-22 DIAGNOSIS — K838 Other specified diseases of biliary tract: Secondary | ICD-10-CM

## 2022-06-22 DIAGNOSIS — I10 Essential (primary) hypertension: Secondary | ICD-10-CM | POA: Diagnosis present

## 2022-06-22 DIAGNOSIS — Z91040 Latex allergy status: Secondary | ICD-10-CM

## 2022-06-22 DIAGNOSIS — F319 Bipolar disorder, unspecified: Secondary | ICD-10-CM | POA: Diagnosis present

## 2022-06-22 DIAGNOSIS — K8309 Other cholangitis: Secondary | ICD-10-CM | POA: Diagnosis present

## 2022-06-22 DIAGNOSIS — D573 Sickle-cell trait: Secondary | ICD-10-CM | POA: Diagnosis present

## 2022-06-22 DIAGNOSIS — R112 Nausea with vomiting, unspecified: Secondary | ICD-10-CM

## 2022-06-22 DIAGNOSIS — D509 Iron deficiency anemia, unspecified: Secondary | ICD-10-CM | POA: Diagnosis present

## 2022-06-22 DIAGNOSIS — E876 Hypokalemia: Secondary | ICD-10-CM | POA: Diagnosis present

## 2022-06-22 DIAGNOSIS — Z9049 Acquired absence of other specified parts of digestive tract: Secondary | ICD-10-CM

## 2022-06-22 DIAGNOSIS — R1011 Right upper quadrant pain: Secondary | ICD-10-CM | POA: Diagnosis not present

## 2022-06-22 DIAGNOSIS — R7989 Other specified abnormal findings of blood chemistry: Secondary | ICD-10-CM

## 2022-06-22 DIAGNOSIS — K8041 Calculus of bile duct with cholecystitis, unspecified, with obstruction: Secondary | ICD-10-CM | POA: Diagnosis not present

## 2022-06-22 DIAGNOSIS — Z87891 Personal history of nicotine dependence: Secondary | ICD-10-CM

## 2022-06-22 DIAGNOSIS — E871 Hypo-osmolality and hyponatremia: Secondary | ICD-10-CM | POA: Diagnosis present

## 2022-06-22 DIAGNOSIS — R748 Abnormal levels of other serum enzymes: Secondary | ICD-10-CM | POA: Diagnosis present

## 2022-06-22 DIAGNOSIS — R066 Hiccough: Secondary | ICD-10-CM | POA: Diagnosis not present

## 2022-06-22 DIAGNOSIS — R7401 Elevation of levels of liver transaminase levels: Secondary | ICD-10-CM | POA: Diagnosis present

## 2022-06-22 DIAGNOSIS — K8031 Calculus of bile duct with cholangitis, unspecified, with obstruction: Secondary | ICD-10-CM | POA: Diagnosis present

## 2022-06-22 DIAGNOSIS — F419 Anxiety disorder, unspecified: Secondary | ICD-10-CM | POA: Diagnosis present

## 2022-06-22 LAB — D-DIMER, QUANTITATIVE: D-Dimer, Quant: 0.95 ug/mL-FEU — ABNORMAL HIGH (ref 0.00–0.50)

## 2022-06-22 LAB — LACTIC ACID, PLASMA: Lactic Acid, Venous: 1.5 mmol/L (ref 0.5–1.9)

## 2022-06-22 LAB — URINALYSIS, ROUTINE W REFLEX MICROSCOPIC
Bilirubin Urine: NEGATIVE
Glucose, UA: NEGATIVE mg/dL
Hgb urine dipstick: NEGATIVE
Ketones, ur: 40 mg/dL — AB
Leukocytes,Ua: NEGATIVE
Nitrite: NEGATIVE
Protein, ur: NEGATIVE mg/dL
Specific Gravity, Urine: 1.02 (ref 1.005–1.030)
pH: 5 (ref 5.0–8.0)

## 2022-06-22 LAB — COMPREHENSIVE METABOLIC PANEL
ALT: 416 U/L — ABNORMAL HIGH (ref 0–44)
AST: 281 U/L — ABNORMAL HIGH (ref 15–41)
Albumin: 3.6 g/dL (ref 3.5–5.0)
Alkaline Phosphatase: 358 U/L — ABNORMAL HIGH (ref 38–126)
Anion gap: 11 (ref 5–15)
BUN: 9 mg/dL (ref 6–20)
CO2: 23 mmol/L (ref 22–32)
Calcium: 8.7 mg/dL — ABNORMAL LOW (ref 8.9–10.3)
Chloride: 98 mmol/L (ref 98–111)
Creatinine, Ser: 0.77 mg/dL (ref 0.44–1.00)
GFR, Estimated: >60 mL/min (ref >60)
Glucose, Bld: 93 mg/dL (ref 70–99)
Potassium: 3.8 mmol/L (ref 3.5–5.1)
Sodium: 132 mmol/L — ABNORMAL LOW (ref 135–145)
Total Bilirubin: 1.2 mg/dL (ref 0.3–1.2)
Total Protein: 8.4 g/dL — ABNORMAL HIGH (ref 6.5–8.1)

## 2022-06-22 LAB — CBC
HCT: 36.9 % (ref 36.0–46.0)
Hemoglobin: 12.6 g/dL (ref 12.0–15.0)
MCH: 25.8 pg — ABNORMAL LOW (ref 26.0–34.0)
MCHC: 34.1 g/dL (ref 30.0–36.0)
MCV: 75.5 fL — ABNORMAL LOW (ref 80.0–100.0)
Platelets: 343 10*3/uL (ref 150–400)
RBC: 4.89 MIL/uL (ref 3.87–5.11)
RDW: 14.9 % (ref 11.5–15.5)
WBC: 13.9 10*3/uL — ABNORMAL HIGH (ref 4.0–10.5)
nRBC: 0 % (ref 0.0–0.2)

## 2022-06-22 LAB — PREGNANCY, URINE: Preg Test, Ur: NEGATIVE

## 2022-06-22 LAB — LIPASE, BLOOD: Lipase: 25 U/L (ref 11–51)

## 2022-06-22 LAB — TROPONIN I (HIGH SENSITIVITY): Troponin I (High Sensitivity): 3 ng/L (ref <18)

## 2022-06-22 MED ORDER — IOHEXOL 350 MG/ML SOLN
100.0000 mL | Freq: Once | INTRAVENOUS | Status: AC | PRN
  Administered 2022-06-22: 100 mL via INTRAVENOUS

## 2022-06-22 MED ORDER — ONDANSETRON HCL 4 MG/2ML IJ SOLN
4.0000 mg | Freq: Once | INTRAMUSCULAR | Status: AC
  Administered 2022-06-22: 4 mg via INTRAVENOUS
  Filled 2022-06-22: qty 2

## 2022-06-22 MED ORDER — PIPERACILLIN-TAZOBACTAM 3.375 G IVPB 30 MIN
3.3750 g | Freq: Once | INTRAVENOUS | Status: AC
  Administered 2022-06-22: 3.375 g via INTRAVENOUS
  Filled 2022-06-22: qty 50

## 2022-06-22 MED ORDER — MORPHINE SULFATE (PF) 4 MG/ML IV SOLN
4.0000 mg | Freq: Once | INTRAVENOUS | Status: AC
  Administered 2022-06-22: 4 mg via INTRAVENOUS
  Filled 2022-06-22: qty 1

## 2022-06-22 MED ORDER — SODIUM CHLORIDE 0.9 % IV BOLUS
1000.0000 mL | Freq: Once | INTRAVENOUS | Status: AC
  Administered 2022-06-22: 1000 mL via INTRAVENOUS

## 2022-06-22 NOTE — ED Provider Notes (Signed)
[facility] Provider Note   CSN: [id number] Arrival date & time: 06/22/22  1520     History  Chief Complaint  Patient presents with   Chest Pain    Jamie Hansen is a 27 y.o. female.  The history is provided by the patient and medical records. No language interpreter was used.  Chest Pain Pain location:  L chest Associated symptoms: abdominal pain, fatigue, nausea, shortness of breath and vomiting   Associated symptoms: no back pain, no cough, no fever and no headache   Abdominal Pain Pain location:  RUQ and epigastric Pain quality: aching and sharp   Pain radiates to:  Chest Pain severity:  Severe Onset quality:  Gradual Duration:  2 days Timing:  Constant Progression:  Unchanged Chronicity:  New Context: not trauma   Relieved by:  Nothing Worsened by:  Palpation Ineffective treatments:  None tried Associated symptoms: chest pain, chills, fatigue, nausea, shortness of breath and vomiting   Associated symptoms: no constipation, no cough, no diarrhea, no dysuria, no fever, no vaginal bleeding and no vaginal discharge        Home Medications Prior to Admission medications   Medication Sig Start Date End Date Taking? Authorizing Provider  ibuprofen (ADVIL) 200 MG tablet Take 200 mg by mouth every 6 (six) hours as needed for mild pain or fever.    [provider]  ondansetron (ZOFRAN) 4 MG tablet Take 1 tablet (4 mg total) by mouth daily as needed for nausea or vomiting. 02/25/22 02/25/23  Rhetta Mura, MD  oxyCODONE (OXY IR/ROXICODONE) 5 MG immediate release tablet Take 1 tablet (5 mg total) by mouth every 6 (six) hours as needed for severe pain. 02/25/22   Rhetta Mura, MD      Allergies    Latex    Review of Systems   Review of Systems  Constitutional:  Positive for chills and fatigue. Negative for fever.  HENT:  Negative for congestion.   Respiratory:  Positive for shortness of breath.  Negative for cough, chest tightness and wheezing.   Cardiovascular:  Positive for chest pain.  Gastrointestinal:  Positive for abdominal pain, nausea and vomiting. Negative for abdominal distention, constipation and diarrhea.  Genitourinary:  Negative for dysuria, flank pain, frequency, vaginal bleeding and vaginal discharge.  Musculoskeletal:  Negative for back pain, neck pain and neck stiffness.  Skin:  Negative for rash.  Neurological:  Negative for light-headedness and headaches.  Psychiatric/Behavioral:  Negative for agitation and confusion.   All other systems reviewed and are negative.   Physical Exam Updated Vital Signs BP 128/68 (BP Location: Left Arm)   Pulse (!) 110   Temp 98.4 F (36.9 C)   Resp 20   Ht 4\' 11"  (1.499 m)   Wt 54.4 kg   LMP 05/03/2022   SpO2 100%   BMI 24.24 kg/m  Physical Exam Vitals and nursing note reviewed.  Constitutional:      General: She is not in acute distress.    Appearance: She is well-developed. She is not ill-appearing, toxic-appearing or diaphoretic.  HENT:     Head: Normocephalic and atraumatic.     Nose: No congestion or rhinorrhea.     Mouth/Throat:     Mouth: Mucous membranes are dry.     Pharynx: No oropharyngeal exudate or posterior oropharyngeal erythema.  Eyes:     Conjunctiva/sclera: Conjunctivae normal.     Pupils: Pupils are equal, round, and reactive to light.  Cardiovascular:  Rate and Rhythm: Regular rhythm. Tachycardia present.     Heart sounds: Normal heart sounds. No murmur heard. Pulmonary:     Effort: Pulmonary effort is normal. No respiratory distress.     Breath sounds: Normal breath sounds. No wheezing, rhonchi or rales.  Chest:     Chest wall: No tenderness.  Abdominal:     Palpations: Abdomen is soft.     Tenderness: There is abdominal tenderness. There is no guarding or rebound.  Musculoskeletal:        General: No swelling.     Cervical back: Neck supple.     Right lower leg: No edema.     Left  lower leg: No edema.  Skin:    General: Skin is warm and dry.     Capillary Refill: Capillary refill takes less than 2 seconds.  Neurological:     Mental Status: She is alert.  Psychiatric:        Mood and Affect: Mood normal.     ED Results / Procedures / Treatments   Labs (all labs ordered are listed, but only abnormal results are displayed) Labs Reviewed  COMPREHENSIVE METABOLIC PANEL - Abnormal; Notable for the following components:      Result Value   Sodium 132 (*)    Calcium 8.7 (*)    Total Protein 8.4 (*)    AST 281 (*)    ALT 416 (*)    Alkaline Phosphatase 358 (*)    All other components within normal limits  CBC - Abnormal; Notable for the following components:   WBC 13.9 (*)    MCV 75.5 (*)    MCH 25.8 (*)    All other components within normal limits  URINALYSIS, ROUTINE W REFLEX MICROSCOPIC - Abnormal; Notable for the following components:   Ketones, ur 40 (*)    All other components within normal limits  D-DIMER, QUANTITATIVE - Abnormal; Notable for the following components:   D-Dimer, Quant 0.95 (*)    All other components within normal limits  LIPASE, BLOOD  PREGNANCY, URINE  LACTIC ACID, PLASMA  TROPONIN I (HIGH SENSITIVITY)    EKG EKG Interpretation  Date/Time:  Tuesday June 22 2022 15:31:51 EDT Ventricular Rate:  96 PR Interval:  124 QRS Duration: 78 QT Interval:  370 QTC Calculation: 468 R Axis:   91 Text Interpretation: Sinus rhythm Borderline right axis deviation Borderline T abnormalities, anterior leads when comapred to prior, overall similar appearance. NO STEMI Confirmed by Theda Belfast ([id number]) on 06/22/2022 3:49:15 PM  Radiology CT Angio Chest PE W and/or Wo Contrast  Result Date: 06/22/2022 CLINICAL DATA:  Positive D-dimer. Low to intermediate probability of PE. Biliary obstruction suspected. EXAM: CT ANGIOGRAPHY CHEST CT ABDOMEN AND PELVIS WITH CONTRAST TECHNIQUE: Multidetector CT imaging of the chest was performed using the  standard protocol during bolus administration of intravenous contrast. Multiplanar CT image reconstructions and MIPs were obtained to evaluate the vascular anatomy. Multidetector CT imaging of the abdomen and pelvis was performed using the standard protocol during bolus administration of intravenous contrast. RADIATION DOSE REDUCTION: This exam was performed according to the departmental dose-optimization program which includes automated exposure control, adjustment of the mA and/or kV according to patient size and/or use of iterative reconstruction technique. CONTRAST:  OMNIPAQUE IOHEXOL 350 MG/ML SOLN COMPARISON:  Chest radiograph 06/22/2022; abdominal ultrasound 06/22/2022; MRI 02/23/2022; CT abdomen and pelvis 02/22/2022 FINDINGS: CTA CHEST FINDINGS Cardiovascular: Satisfactory opacification of the pulmonary arteries to the segmental level. No evidence of pulmonary embolism. Normal  heart size. No pericardial effusion. Mediastinum/Nodes: Unremarkable esophagus.  No thoracic adenopathy. Lungs/Pleura: No focal consolidation, pleural effusion, pneumothorax. Patent airways. Musculoskeletal: No acute fracture. Review of the MIP images confirms the above findings. CT ABDOMEN and PELVIS FINDINGS Hepatobiliary: Right-greater-than-left intrahepatic biliary dilation. This may be slightly increased compared to 02/22/2022. Unchanged caliber of the common bile duct measuring 8 mm diameter. No radiopaque stones. The prior pneumobilia has resolved. Unremarkable liver. Cholecystectomy. Pancreas: No acute abnormality. Spleen: Unremarkable. Adrenals/Urinary Tract: Normal adrenal glands. No urinary calculi or hydronephrosis. Unremarkable bladder. Stomach/Bowel: Normal caliber large and small bowel. No bowel wall thickening. Normal appendix. Stomach is within normal limits. Vascular/Lymphatic: No significant vascular findings are present. No enlarged abdominal or pelvic lymph nodes. Reproductive: Similar size of the left  adnexal mass containing a 2.1 cm area of macroscopic fat. Right tubal ligation clips. Unremarkable uterus. Other: No free intraperitoneal fluid or air. Musculoskeletal: No acute fracture. Review of the MIP images confirms the above findings. IMPRESSION: 1. No evidence of pulmonary embolism or other acute findings in the chest. 2. Right-greater-than-left intrahepatic biliary dilation. This may be slightly increased compared to 02/22/2022. Unchanged caliber of the common bile duct measuring 8 mm diameter. No radiopaque stones. The asymmetric intrahepatic biliary dilation remains nonspecific with differential considerations of stricture, occult intraductal calculi, or neoplasm. 3. Similar left adnexal ovarian dermoid. Electronically Signed   By: Minerva Fester M.D.   On: 06/22/2022 22:04   CT ABDOMEN PELVIS W CONTRAST  Result Date: 06/22/2022 CLINICAL DATA:  Positive D-dimer. Low to intermediate probability of PE. Biliary obstruction suspected. EXAM: CT ANGIOGRAPHY CHEST CT ABDOMEN AND PELVIS WITH CONTRAST TECHNIQUE: Multidetector CT imaging of the chest was performed using the standard protocol during bolus administration of intravenous contrast. Multiplanar CT image reconstructions and MIPs were obtained to evaluate the vascular anatomy. Multidetector CT imaging of the abdomen and pelvis was performed using the standard protocol during bolus administration of intravenous contrast. RADIATION DOSE REDUCTION: This exam was performed according to the departmental dose-optimization program which includes automated exposure control, adjustment of the mA and/or kV according to patient size and/or use of iterative reconstruction technique. CONTRAST:  OMNIPAQUE IOHEXOL 350 MG/ML SOLN COMPARISON:  Chest radiograph 06/22/2022; abdominal ultrasound 06/22/2022; MRI 02/23/2022; CT abdomen and pelvis 02/22/2022 FINDINGS: CTA CHEST FINDINGS Cardiovascular: Satisfactory opacification of the pulmonary arteries to the  segmental level. No evidence of pulmonary embolism. Normal heart size. No pericardial effusion. Mediastinum/Nodes: Unremarkable esophagus.  No thoracic adenopathy. Lungs/Pleura: No focal consolidation, pleural effusion, pneumothorax. Patent airways. Musculoskeletal: No acute fracture. Review of the MIP images confirms the above findings. CT ABDOMEN and PELVIS FINDINGS Hepatobiliary: Right-greater-than-left intrahepatic biliary dilation. This may be slightly increased compared to 02/22/2022. Unchanged caliber of the common bile duct measuring 8 mm diameter. No radiopaque stones. The prior pneumobilia has resolved. Unremarkable liver. Cholecystectomy. Pancreas: No acute abnormality. Spleen: Unremarkable. Adrenals/Urinary Tract: Normal adrenal glands. No urinary calculi or hydronephrosis. Unremarkable bladder. Stomach/Bowel: Normal caliber large and small bowel. No bowel wall thickening. Normal appendix. Stomach is within normal limits. Vascular/Lymphatic: No significant vascular findings are present. No enlarged abdominal or pelvic lymph nodes. Reproductive: Similar size of the left adnexal mass containing a 2.1 cm area of macroscopic fat. Right tubal ligation clips. Unremarkable uterus. Other: No free intraperitoneal fluid or air. Musculoskeletal: No acute fracture. Review of the MIP images confirms the above findings. IMPRESSION: 1. No evidence of pulmonary embolism or other acute findings in the chest. 2. Right-greater-than-left intrahepatic biliary dilation. This may be slightly  increased compared to 02/22/2022. Unchanged caliber of the common bile duct measuring 8 mm diameter. No radiopaque stones. The asymmetric intrahepatic biliary dilation remains nonspecific with differential considerations of stricture, occult intraductal calculi, or neoplasm. 3. Similar left adnexal ovarian dermoid. Electronically Signed   By: Minerva Fester M.D.   On: 06/22/2022 22:04   US Abdomen Limited RUQ (LIVER/GB)  Result Date:  06/22/2022 CLINICAL DATA:  151470 RUQ abdominal pain [id number] EXAM: ULTRASOUND ABDOMEN LIMITED COMPARISON:  12/31/2021 FINDINGS: The liver demonstrates normal parenchymal echogenicity and homogeneous texture without focal hepatic parenchymal lesions or intrahepatic ductal dilatation. Status post cholecystectomy. Possible stone in the proximal CBD which could be assessed further with MRCP. Common bile duct not visualized. Imaged portions of the pancreas were unremarkable. IMPRESSION: Possible shadowing stone in the CBD which was otherwise not well visualized. Status post cholecystectomy. Consider correlation with MRCP. Electronically Signed   By: Layla Maw M.D.   On: 06/22/2022 21:14   DG Chest Portable 1 View  Result Date: 06/22/2022 CLINICAL DATA:  r lower chest pain EXAM: PORTABLE CHEST - 1 VIEW COMPARISON:  02/22/2022 FINDINGS: Cardiac silhouette is unremarkable. No pneumothorax or pleural effusion. The lungs are clear. The visualized skeletal structures are unremarkable. IMPRESSION: No acute cardiopulmonary process. Electronically Signed   By: Layla Maw M.D.   On: 06/22/2022 20:36    Procedures Procedures    Medications Ordered in ED Medications  morphine (PF) 4 MG/ML injection 4 mg (4 mg Intravenous Given 06/22/22 2104)  ondansetron (ZOFRAN) injection 4 mg (4 mg Intravenous Given 06/22/22 2105)  sodium chloride 0.9 % bolus 1,000 mL (0 mLs Intravenous Stopped 06/22/22 2255)  iohexol (OMNIPAQUE) 350 MG/ML injection 100 mL (100 mLs Intravenous Contrast Given 06/22/22 2131)  morphine (PF) 4 MG/ML injection 4 mg (4 mg Intravenous Given 06/22/22 2203)  piperacillin-tazobactam (ZOSYN) IVPB 3.375 g (0 g Intravenous Stopped 06/22/22 2342)    ED Course/ Medical Decision Making/ A&P                             Medical Decision Making Amount and/or Complexity of Data Reviewed Labs: ordered. Radiology: ordered.  Risk Prescription drug management. Decision regarding  hospitalization.    Jamie Hansen is a 27 y.o. female with a past medical history significant bipolar disorder, anxiety, previous choledocholithiasis status post cholecystectomy and subsequent post ERCP pancreatitis and biliary dilation who presents with acutely worsened nausea, vomiting, and right upper quadrant abdominal pain with some shortness of breath and chest discomfort.  According to patient, she has had some smoldering nausea and abdominal discomfort ever since her procedure months ago but today it rapidly worsened late morning.  She denies any trauma.  She reports she has had lots of nausea and vomiting and right upper quadrant abdominal pain going towards her right chest.  She reports it does hurt to take a deep breath but denies other fevers or cough.  She reports some mild chills and reports she did have some chest discomfort and shortness of breath in that right lower chest.  Hurts take a deep breath.  She denies any rash to suggest shingles and denies any constipation, diarrhea, or urinary changes.  She reports it feels similar to when she had the cholecystitis.  On exam, lungs clear and chest nontender.  Abdomen is quite tender in the right upper quadrant going towards her right flank.  Back and CVA areas nontender.  Intact pulses.  Mucous membranes are dry.  Rest of abdomen nontender.  Given patient's history of post cholecystectomy problems such as sludge and pancreatitis, will get ultrasound and labs.  With her pleuritic discomfort, will get a D-dimer to help rule out PE since she is tachycardic at this time.  Will give some pain medicine, nausea medicine, and fluids.  Will get urinalysis and otherwise.  Patient had some labs in triage that returned showing evidence of LFT elevation compared to prior.  Patient does have elevated AST, ALT and alk phos but bilirubin is normal.  Urinalysis showed some dehydration but no evidence of infection with no nitrites or leukocytes.   Lipase is normal so she does not have pancreatitis at this time.  Patient does have elevated white blood cell count and she is not pregnant.  Will wait for troponin, D-dimer and will get the ultrasound.  Will wait for the D-dimer but will likely also need CT abdomen pelvis with contrast and CT PE study if her dimer is positive.  If it is negative, will just get the CT abdomen/pelvis.  Will get a chest x-ray while we wait.  Anticipate discussion with gastroenterology once workup is completed for disposition.  9:28 PM Patient's LFTs are elevated.  Her ultrasound shows possible common bile duct stone charting.  Will get CT scan to further evaluate her D-dimer is elevated will get PE study as well.  Patient still feeling ill.  She reports she did have a fever of 101 earlier today.  I am now concerned she could be developing a cholangitis.  Anticipate discussion with GI after workup is completed.  10:31 PM CT imaging shows no evidence of pulmonary embolism but does show some regarding with intrahepatic biliary dilation.  Increased from prior.  No radiopaque stone seen however the ultrasound does show some concern for CBD stone.  Will call GI but due to her LFT elevation, continued symptoms, and possible stone, dissipate admission for ERCP or MRCP.  Will discuss if they want antibiotics given the reported fever earlier.  10:46 PM Spoke to Dr. Levora Angel with [facility] who agrees with admission to either [facility] or [facility] and they will see in consultation.  He recommended antibiotics and agrees with medicine admission for further workup.  She may need MRI or other imaging in the morning.        Final Clinical Impression(s) / ED Diagnoses Final diagnoses:  Right upper quadrant abdominal pain  Nausea and vomiting, unspecified vomiting type  LFT elevation    Clinical Impression: 1. Right upper quadrant abdominal pain   2. Nausea and vomiting, unspecified vomiting type   3. LFT elevation      Disposition: Admit  This note was prepared with assistance of Dragon voice recognition software. Occasional wrong-word or sound-a-like substitutions may have occurred due to the inherent limitations of voice recognition software.     Cordale Manera, Canary Brim, MD 06/23/22 0000

## 2022-06-22 NOTE — Progress Notes (Signed)
Plan of Care Note for accepted transfer   Patient: Jamie Hansen MRN: [id number]   DOA: 06/22/2022  Facility requesting transfer: [facility] ED Requesting Provider: Dr. Rush Landmark, EDP Reason for transfer: Possible cholidocholithiasis  Facility course: The patient is a 27 year old female with no significant past medical history except for s/p cholecystectomy years ago, ERCP induced pancreatitis, who presents to [facility] ED with progressive nausea, right upper quadrant abdominal pain and right chest pain.  Associated with a subjective fever at home 101.5 this morning.  She presents to the ED for further evaluation.  In the ED, lab studies are notable for leukocytosis WBC 13.9 K, acute transaminitis with normal T. bili.  D-dimer also elevated 0.95.  The patient had a right upper quadrant abdominal ultrasound which showed the following findings: Possible shadowing stone in the CBD which was otherwise not well visualized. Status post cholecystectomy. Consider correlation with MRCP.  Due to her complaints of chest pain, subsequently, she had a CT angio chest PE study and CT abdomen and pelvis with contrast which revealed the following findings: 1. No evidence of pulmonary embolism or other acute findings in the chest. 2. Right-greater-than-left intrahepatic biliary dilation. This may be slightly increased compared to 02/22/2022. Unchanged caliber of the common bile duct measuring 8 mm diameter. No radiopaque stones. The asymmetric intrahepatic biliary dilation remains nonspecific with differential considerations of stricture, occult intraductal calculi, or neoplasm. 3. Similar left adnexal ovarian dermoid.  EDP discussed the case with [facility] Dr. Georgiann Cocker.  Due to concern for possible acute cholangitis the patient was started on broad-spectrum IV antibiotics Zosyn.  GI will see in consultation for possible ERCP if MRCP is positive.  The patient received IV fluid hydration, IV opioid-based  analgesics, and IV antiemetics.  Vital signs are currently stable.  Pregnancy test is negative.  The patient was admitted by [facility], hospitalist service, to [facility] MedSurg unit as inpatient status.  Please contact [facility] once the patient arrives to the hospital.  Plan of care: The patient is accepted for admission to Med-surg  unit, at [facility].  Author: Darlin Drop, DO 06/22/2022  Check [email or web address] for on-call coverage.  Nursing staff, Please call [facility] Admits & Consults System-Wide number on Amion as soon as patient's arrival, so appropriate admitting provider can evaluate the pt.

## 2022-06-22 NOTE — ED Triage Notes (Signed)
C/O chest pain while she was at work earlier today. Reports she has had hx of gallbladder surgery and had some complications including her liver and pancreas. Reports she has a sickle cell trait. States when she has a hard time at work she experience symptoms of chest pain and shortness of breathe. Stated she took tylenol PTA, with min relief.

## 2022-06-23 ENCOUNTER — Inpatient Hospital Stay (HOSPITAL_COMMUNITY): Payer: BC Managed Care – PPO

## 2022-06-23 DIAGNOSIS — E871 Hypo-osmolality and hyponatremia: Secondary | ICD-10-CM | POA: Diagnosis present

## 2022-06-23 DIAGNOSIS — K8031 Calculus of bile duct with cholangitis, unspecified, with obstruction: Secondary | ICD-10-CM | POA: Diagnosis present

## 2022-06-23 DIAGNOSIS — K838 Other specified diseases of biliary tract: Secondary | ICD-10-CM

## 2022-06-23 DIAGNOSIS — R112 Nausea with vomiting, unspecified: Secondary | ICD-10-CM | POA: Diagnosis not present

## 2022-06-23 DIAGNOSIS — R7401 Elevation of levels of liver transaminase levels: Secondary | ICD-10-CM | POA: Diagnosis present

## 2022-06-23 DIAGNOSIS — I10 Essential (primary) hypertension: Secondary | ICD-10-CM | POA: Diagnosis present

## 2022-06-23 DIAGNOSIS — D509 Iron deficiency anemia, unspecified: Secondary | ICD-10-CM | POA: Diagnosis present

## 2022-06-23 DIAGNOSIS — E876 Hypokalemia: Secondary | ICD-10-CM | POA: Diagnosis present

## 2022-06-23 DIAGNOSIS — Z87891 Personal history of nicotine dependence: Secondary | ICD-10-CM | POA: Diagnosis not present

## 2022-06-23 DIAGNOSIS — R1011 Right upper quadrant pain: Secondary | ICD-10-CM | POA: Diagnosis present

## 2022-06-23 DIAGNOSIS — R748 Abnormal levels of other serum enzymes: Secondary | ICD-10-CM | POA: Diagnosis present

## 2022-06-23 DIAGNOSIS — D573 Sickle-cell trait: Secondary | ICD-10-CM | POA: Diagnosis present

## 2022-06-23 DIAGNOSIS — F319 Bipolar disorder, unspecified: Secondary | ICD-10-CM | POA: Diagnosis present

## 2022-06-23 DIAGNOSIS — Z91040 Latex allergy status: Secondary | ICD-10-CM | POA: Diagnosis not present

## 2022-06-23 DIAGNOSIS — Z9049 Acquired absence of other specified parts of digestive tract: Secondary | ICD-10-CM | POA: Diagnosis not present

## 2022-06-23 DIAGNOSIS — K8041 Calculus of bile duct with cholecystitis, unspecified, with obstruction: Secondary | ICD-10-CM | POA: Diagnosis present

## 2022-06-23 DIAGNOSIS — R7989 Other specified abnormal findings of blood chemistry: Secondary | ICD-10-CM | POA: Diagnosis present

## 2022-06-23 DIAGNOSIS — K8309 Other cholangitis: Secondary | ICD-10-CM | POA: Diagnosis present

## 2022-06-23 DIAGNOSIS — F419 Anxiety disorder, unspecified: Secondary | ICD-10-CM | POA: Diagnosis present

## 2022-06-23 DIAGNOSIS — R066 Hiccough: Secondary | ICD-10-CM | POA: Diagnosis not present

## 2022-06-23 LAB — CBC
HCT: 32.5 % — ABNORMAL LOW (ref 36.0–46.0)
Hemoglobin: 11.3 g/dL — ABNORMAL LOW (ref 12.0–15.0)
MCH: 26 pg (ref 26.0–34.0)
MCHC: 34.8 g/dL (ref 30.0–36.0)
MCV: 74.7 fL — ABNORMAL LOW (ref 80.0–100.0)
Platelets: 319 10*3/uL (ref 150–400)
RBC: 4.35 MIL/uL (ref 3.87–5.11)
RDW: 15 % (ref 11.5–15.5)
WBC: 13.1 10*3/uL — ABNORMAL HIGH (ref 4.0–10.5)
nRBC: 0 % (ref 0.0–0.2)

## 2022-06-23 LAB — COMPREHENSIVE METABOLIC PANEL
ALT: 295 U/L — ABNORMAL HIGH (ref 0–44)
AST: 122 U/L — ABNORMAL HIGH (ref 15–41)
Albumin: 2.9 g/dL — ABNORMAL LOW (ref 3.5–5.0)
Alkaline Phosphatase: 301 U/L — ABNORMAL HIGH (ref 38–126)
Anion gap: 10 (ref 5–15)
BUN: 5 mg/dL — ABNORMAL LOW (ref 6–20)
CO2: 23 mmol/L (ref 22–32)
Calcium: 8.2 mg/dL — ABNORMAL LOW (ref 8.9–10.3)
Chloride: 100 mmol/L (ref 98–111)
Creatinine, Ser: 0.76 mg/dL (ref 0.44–1.00)
GFR, Estimated: >60 mL/min (ref >60)
Glucose, Bld: 112 mg/dL — ABNORMAL HIGH (ref 70–99)
Potassium: 3.4 mmol/L — ABNORMAL LOW (ref 3.5–5.1)
Sodium: 133 mmol/L — ABNORMAL LOW (ref 135–145)
Total Bilirubin: 1.7 mg/dL — ABNORMAL HIGH (ref 0.3–1.2)
Total Protein: 7 g/dL (ref 6.5–8.1)

## 2022-06-23 MED ORDER — KETOROLAC TROMETHAMINE 15 MG/ML IJ SOLN
15.0000 mg | Freq: Once | INTRAMUSCULAR | Status: AC
  Administered 2022-06-23: 15 mg via INTRAVENOUS
  Filled 2022-06-23: qty 1

## 2022-06-23 MED ORDER — MORPHINE SULFATE (PF) 2 MG/ML IV SOLN
1.0000 mg | INTRAVENOUS | Status: AC | PRN
  Administered 2022-06-23: 1 mg via INTRAVENOUS
  Filled 2022-06-23 (×2): qty 1

## 2022-06-23 MED ORDER — ONDANSETRON HCL 4 MG/2ML IJ SOLN
4.0000 mg | Freq: Four times a day (QID) | INTRAMUSCULAR | Status: AC | PRN
  Administered 2022-06-23: 4 mg via INTRAVENOUS
  Filled 2022-06-23: qty 2

## 2022-06-23 MED ORDER — NALOXONE HCL 0.4 MG/ML IJ SOLN: 0.4000 mg | INTRAMUSCULAR | Status: DC | PRN

## 2022-06-23 MED ORDER — HYDROMORPHONE HCL 1 MG/ML IJ SOLN
1.0000 mg | INTRAMUSCULAR | Status: DC | PRN
  Administered 2022-06-23 (×2): 1 mg via INTRAVENOUS
  Filled 2022-06-23 (×2): qty 1

## 2022-06-23 MED ORDER — ONDANSETRON HCL 4 MG/2ML IJ SOLN
4.0000 mg | Freq: Four times a day (QID) | INTRAMUSCULAR | Status: DC | PRN
  Administered 2022-06-24: 4 mg via INTRAVENOUS
  Filled 2022-06-23 (×2): qty 2

## 2022-06-23 MED ORDER — PIPERACILLIN-TAZOBACTAM 3.375 G IVPB
3.3750 g | Freq: Three times a day (TID) | INTRAVENOUS | Status: DC
  Administered 2022-06-23 – 2022-06-25 (×7): 3.375 g via INTRAVENOUS
  Filled 2022-06-23 (×7): qty 50

## 2022-06-23 MED ORDER — HYDROMORPHONE HCL 1 MG/ML IJ SOLN
1.0000 mg | Freq: Once | INTRAMUSCULAR | Status: AC
  Administered 2022-06-23: 1 mg via INTRAVENOUS
  Filled 2022-06-23: qty 1

## 2022-06-23 MED ORDER — POTASSIUM CHLORIDE CRYS ER 20 MEQ PO TBCR
40.0000 meq | EXTENDED_RELEASE_TABLET | Freq: Once | ORAL | Status: AC
  Administered 2022-06-23: 40 meq via ORAL
  Filled 2022-06-23: qty 2

## 2022-06-23 MED ORDER — FENTANYL CITRATE PF 50 MCG/ML IJ SOSY
50.0000 ug | PREFILLED_SYRINGE | Freq: Once | INTRAMUSCULAR | Status: AC
  Administered 2022-06-23: 50 ug via INTRAVENOUS
  Filled 2022-06-23: qty 1

## 2022-06-23 MED ORDER — GADOBUTROL 1 MMOL/ML IV SOLN
5.0000 mL | Freq: Once | INTRAVENOUS | Status: AC | PRN
  Administered 2022-06-23: 5 mL via INTRAVENOUS

## 2022-06-23 MED ORDER — OXYCODONE HCL 5 MG PO TABS
5.0000 mg | ORAL_TABLET | Freq: Four times a day (QID) | ORAL | Status: DC | PRN
  Administered 2022-06-23 – 2022-06-25 (×2): 5 mg via ORAL
  Filled 2022-06-23 (×2): qty 1

## 2022-06-23 MED ORDER — MORPHINE SULFATE (PF) 2 MG/ML IV SOLN: 1.0000 mg | Freq: Once | INTRAVENOUS | Status: DC | PRN

## 2022-06-23 MED ORDER — HYDROMORPHONE HCL 1 MG/ML IJ SOLN
1.0000 mg | INTRAMUSCULAR | Status: DC | PRN
  Administered 2022-06-23 – 2022-06-25 (×6): 2 mg via INTRAVENOUS
  Filled 2022-06-23 (×7): qty 2

## 2022-06-23 MED ORDER — HYDROXYZINE HCL 25 MG PO TABS
25.0000 mg | ORAL_TABLET | Freq: Three times a day (TID) | ORAL | Status: DC | PRN
  Administered 2022-06-23: 25 mg via ORAL
  Filled 2022-06-23: qty 1

## 2022-06-23 MED ORDER — SODIUM CHLORIDE 0.9 % IV SOLN: INTRAVENOUS | Status: AC

## 2022-06-23 MED ORDER — ONDANSETRON HCL 4 MG/2ML IJ SOLN
4.0000 mg | Freq: Once | INTRAMUSCULAR | Status: AC | PRN
  Administered 2022-06-23: 4 mg via INTRAVENOUS
  Filled 2022-06-23: qty 2

## 2022-06-23 NOTE — Progress Notes (Signed)
[facility]                                                                              Dannon Perlow, is a 27 y.o. female, DOB - 1995/11/22, ZOX:[id number] Admit date - 06/22/2022    Outpatient Primary MD for the patient is Patient, No Pcp Per  LOS - 0  days  Chief Complaint  Patient presents with   Chest Pain       Brief summary   Patient is a 27 year old female with bipolar disorder, anxiety, previous choledocholithiasis status postcholecystectomy in 12/2021 and subsequent post ERCP pancreatitis.  Patient was admitted in 02/2022 for RUQ abdominal pain and found to have hyperbilirubinemia, MRCP showed dilated bile ducts.  Underwent EUS and ERCP which did not show specific blockages. Presented with nausea, vomiting, right upper quadrant abdominal pain.  Patient also reported some shortness of breath with chest discomfort. Noted to have some tachycardia, afebrile. WBC 13.9, sodium 132, AST 281, ALT 416, alk phos 358, T. bili 1.2, lipase normal, UA not suggestive of infection, urine pregnancy test negative, lactic acid normal, troponin normal, D-dimer 0.95.  RUQ US showed possible shadowing stone in CBD, recommended MRCP. GI consulted.  Assessment & Plan    Principal Problem: Transaminitis, intrahepatic biliary dilatation/?  Choledocholithiasis, with cholangitis -History of prior cholecystectomy, presented with nausea, vomiting, RUQ abdominal pain. -Normal lipase, LFTs elevated. -CT showed asymmetric intrahepatic biliary dilation and ultrasound showed possible CBD stone -GI consulted. -Continue IV fluids, pain control, IV antibiotics -MRCP showed choledocholithiasis with increased intra and extrahepatic biliary duct dilation and associated cholangitis -Continue IV Zosyn, obtain blood cultures - NPO for ERCP in AM   Active Problems:   Bipolar disorder (HCC),   Anxiety -Currently n.p.o.    Hyponatremia Continue IV fluid hydration, sodium  improving  Sickle cell trait: -Per pt, she had history of sickle cell trait - currently no acute issues but would like outpatient referral to hematology  Microcytic anemia  - H/H close to baseline ~11, MCV  74.7 - follow anemia panel in am    Estimated body mass index is 24.24 kg/m as calculated from the following:   Height as of this encounter: 4\' 11"  (1.499 m).   Weight as of this encounter: 54.4 kg.  Code Status: Full code DVT Prophylaxis:  SCDs Start: 06/23/22 0541   Level of Care: Level of care: Med-Surg Family Communication: Updated patient Disposition Plan:      Remains inpatient appropriate:      Procedures:  MRCP  Consultants:   GI  Antimicrobials:   Anti-infectives (From admission, onward)    Start     Dose/Rate Route Frequency Ordered Stop   06/23/22 0630  piperacillin-tazobactam (ZOSYN) IVPB 3.375 g        3.375 g 12.5 mL/hr over 240 Minutes Intravenous Every 8 hours 06/23/22 0542     06/22/22 2300  piperacillin-tazobactam (ZOSYN) IVPB 3.375 g        3.375 g 100 mL/hr over 30 Minutes Intravenous  Once 06/22/22 2246 06/22/22 2342          Medications     Subjective:   [name]  was seen and examined today.  C/o RUQ abd pain, back pain, nausea. No active vomiting. Currently no fevers.   Objective:   Vitals:   06/23/22 0200 06/23/22 0437 06/23/22 0748 06/23/22 1622  BP: 112/70 102/66 103/74 100/76  Pulse: 86 67 78   Resp: 18 18 18 18   Temp: (!) 97.5 F (36.4 C) 98.6 F (37 C) 98 F (36.7 C) 98.2 F (36.8 C)  TempSrc:      SpO2: 96% 99% 100% 97%  Weight:      Height:        Intake/Output Summary (Last 24 hours) at 06/23/2022 1757 Last data filed at 06/23/2022 1500 Gross per 24 hour  Intake 1827.8 ml  Output --  Net 1827.8 ml     Wt Readings from Last 3 Encounters:  06/22/22 54.4 kg  02/23/22 58.2 kg  12/31/21 58.1 kg     Exam General: Alert and oriented x 3, NAD Cardiovascular: S1 S2 auscultated,   RRR Respiratory: Clear to auscultation bilaterally, no wheezing Gastrointestinal: Soft, RUQ TTP, nondistended, + bowel sounds Ext: no pedal edema bilaterally Neuro: no new FND's Psych: Normal affect     Data Reviewed:  I have personally reviewed following labs    CBC Lab Results  Component Value Date   WBC 13.1 (H) 06/23/2022   RBC 4.35 06/23/2022   HGB 11.3 (L) 06/23/2022   HCT 32.5 (L) 06/23/2022   MCV 74.7 (L) 06/23/2022   MCH 26.0 06/23/2022   PLT 319 06/23/2022   MCHC 34.8 06/23/2022   RDW 15.0 06/23/2022   LYMPHSABS 0.6 (L) 02/22/2022   MONOABS 0.7 02/22/2022   EOSABS 0.1 02/22/2022   BASOSABS 0.0 02/22/2022     Last metabolic panel Lab Results  Component Value Date   NA 133 (L) 06/23/2022   K 3.4 (L) 06/23/2022   CL 100 06/23/2022   CO2 23 06/23/2022   BUN 5 (L) 06/23/2022   CREATININE 0.76 06/23/2022   GLUCOSE 112 (H) 06/23/2022   GFRNONAA >60 06/23/2022   GFRAA >60 09/27/2017   CALCIUM 8.2 (L) 06/23/2022   PROT 7.0 06/23/2022   ALBUMIN 2.9 (L) 06/23/2022   BILITOT 1.7 (H) 06/23/2022   ALKPHOS 301 (H) 06/23/2022   AST 122 (H) 06/23/2022   ALT 295 (H) 06/23/2022   ANIONGAP 10 06/23/2022    CBG (last 3)  No results for input(s): "GLUCAP" in the last 72 hours.    Coagulation Profile: No results for input(s): "INR", "PROTIME" in the last 168 hours.   Radiology Studies: I have personally reviewed the imaging studies  MR ABDOMEN MRCP W WO CONTAST  Result Date: 06/23/2022 CLINICAL DATA:  History of cholecystectomy complicated by ERCP induced pancreatitis presenting with fever and right upper quadrant abdominal and chest pain EXAM: MRI ABDOMEN WITHOUT AND WITH CONTRAST (INCLUDING MRCP) TECHNIQUE: Multiplanar multisequence MR imaging of the abdomen was performed both before and after the administration of intravenous contrast. Heavily T2-weighted images of the biliary and pancreatic ducts were obtained, and three-dimensional MRCP images were rendered by  post processing. CONTRAST:  5mL GADAVIST GADOBUTROL 1 MMOL/ML IV SOLN COMPARISON:  CT abdomen and pelvis dated 06/22/2022, MRCP dated 02/23/2022 FINDINGS: Lower chest: Left lower lobe linear atelectasis. Hepatobiliary: Heterogeneous T2 hyperintense parenchymal signal of the right lobe associated with diffusion restriction and diffuse heterogeneous enhancement of the right hepatic lobe. Increased intrahepatic bile duct dilation measuring up to 11 mm from 3 mm on the right and extrahepatic bile duct dilation measuring 8 mm, previously 5  mm, with multifocal filling defects noted in the downstream right intrahepatic duct at the confluence and within the downstream common bile duct. Associated right intrahepatic biliary enhancement. There is marked effacement of the intrahepatic bile duct at the confluence (7:85). Cholecystectomy. Pancreas: No mass, inflammatory changes, or other parenchymal abnormality identified. Spleen:  Within normal limits in size and appearance. Adrenals/Urinary Tract: No adrenal nodules. No suspicious renal masses identified. No evidence of hydronephrosis. Stomach/Bowel: Visualized portions within the abdomen are unremarkable. Vascular/Lymphatic: No pathologically enlarged lymph nodes identified. No abdominal aortic aneurysm demonstrated. 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 Trace perisplenic free fluid. Musculoskeletal: No suspicious bone lesions identified. IMPRESSION: Findings of choledocholithiasis with increased intra and extrahepatic bile duct dilation and associated cholangitis. Marked effacement of the intrahepatic bile duct at the confluence, suspicious for underlying stenosis. Electronically Signed   By: Agustin Cree M.D.   On: 06/23/2022 09:57   MR 3D Recon At Scanner  Result Date: 06/23/2022 CLINICAL DATA:  History of cholecystectomy complicated by ERCP induced pancreatitis presenting with fever and right upper quadrant abdominal and chest pain EXAM: MRI ABDOMEN  WITHOUT AND WITH CONTRAST (INCLUDING MRCP) TECHNIQUE: Multiplanar multisequence MR imaging of the abdomen was performed both before and after the administration of intravenous contrast. Heavily T2-weighted images of the biliary and pancreatic ducts were obtained, and three-dimensional MRCP images were rendered by post processing. CONTRAST:  5mL GADAVIST GADOBUTROL 1 MMOL/ML IV SOLN COMPARISON:  CT abdomen and pelvis dated 06/22/2022, MRCP dated 02/23/2022 FINDINGS: Lower chest: Left lower lobe linear atelectasis. Hepatobiliary: Heterogeneous T2 hyperintense parenchymal signal of the right lobe associated with diffusion restriction and diffuse heterogeneous enhancement of the right hepatic lobe. Increased intrahepatic bile duct dilation measuring up to 11 mm from 3 mm on the right and extrahepatic bile duct dilation measuring 8 mm, previously 5 mm, with multifocal filling defects noted in the downstream right intrahepatic duct at the confluence and within the downstream common bile duct. Associated right intrahepatic biliary enhancement. There is marked effacement of the intrahepatic bile duct at the confluence (7:85). Cholecystectomy. Pancreas: No mass, inflammatory changes, or other parenchymal abnormality identified. Spleen:  Within normal limits in size and appearance. Adrenals/Urinary Tract: No adrenal nodules. No suspicious renal masses identified. No evidence of hydronephrosis. Stomach/Bowel: Visualized portions within the abdomen are unremarkable. Vascular/Lymphatic: No pathologically enlarged lymph nodes identified. No abdominal aortic aneurysm demonstrated. 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 Trace perisplenic free fluid. Musculoskeletal: No suspicious bone lesions identified. IMPRESSION: Findings of choledocholithiasis with increased intra and extrahepatic bile duct dilation and associated cholangitis. Marked effacement of the intrahepatic bile duct at the confluence, suspicious for  underlying stenosis. Electronically Signed   By: Agustin Cree M.D.   On: 06/23/2022 09:57   CT Angio Chest PE W and/or Wo Contrast  Result Date: 06/22/2022 CLINICAL DATA:  Positive D-dimer. Low to intermediate probability of PE. Biliary obstruction suspected. EXAM: CT ANGIOGRAPHY CHEST CT ABDOMEN AND PELVIS WITH CONTRAST TECHNIQUE: Multidetector CT imaging of the chest was performed using the standard protocol during bolus administration of intravenous contrast. Multiplanar CT image reconstructions and MIPs were obtained to evaluate the vascular anatomy. Multidetector CT imaging of the abdomen and pelvis was performed using the standard protocol during bolus administration of intravenous contrast. RADIATION DOSE REDUCTION: This exam was performed according to the departmental dose-optimization program which includes automated exposure control, adjustment of the mA and/or kV according to patient size and/or use of iterative reconstruction technique. CONTRAST:  OMNIPAQUE  IOHEXOL 350 MG/ML SOLN COMPARISON:  Chest radiograph 06/22/2022; abdominal ultrasound 06/22/2022; MRI 02/23/2022; CT abdomen and pelvis 02/22/2022 FINDINGS: CTA CHEST FINDINGS Cardiovascular: Satisfactory opacification of the pulmonary arteries to the segmental level. No evidence of pulmonary embolism. Normal heart size. No pericardial effusion. Mediastinum/Nodes: Unremarkable esophagus.  No thoracic adenopathy. Lungs/Pleura: No focal consolidation, pleural effusion, pneumothorax. Patent airways. Musculoskeletal: No acute fracture. Review of the MIP images confirms the above findings. CT ABDOMEN and PELVIS FINDINGS Hepatobiliary: Right-greater-than-left intrahepatic biliary dilation. This may be slightly increased compared to 02/22/2022. Unchanged caliber of the common bile duct measuring 8 mm diameter. No radiopaque stones. The prior pneumobilia has resolved. Unremarkable liver. Cholecystectomy. Pancreas: No acute abnormality. Spleen:  Unremarkable. Adrenals/Urinary Tract: Normal adrenal glands. No urinary calculi or hydronephrosis. Unremarkable bladder. Stomach/Bowel: Normal caliber large and small bowel. No bowel wall thickening. Normal appendix. Stomach is within normal limits. Vascular/Lymphatic: No significant vascular findings are present. No enlarged abdominal or pelvic lymph nodes. Reproductive: Similar size of the left adnexal mass containing a 2.1 cm area of macroscopic fat. Right tubal ligation clips. Unremarkable uterus. Other: No free intraperitoneal fluid or air. Musculoskeletal: No acute fracture. Review of the MIP images confirms the above findings. IMPRESSION: 1. No evidence of pulmonary embolism or other acute findings in the chest. 2. Right-greater-than-left intrahepatic biliary dilation. This may be slightly increased compared to 02/22/2022. Unchanged caliber of the common bile duct measuring 8 mm diameter. No radiopaque stones. The asymmetric intrahepatic biliary dilation remains nonspecific with differential considerations of stricture, occult intraductal calculi, or neoplasm. 3. Similar left adnexal ovarian dermoid. Electronically Signed   By: Minerva Fester M.D.   On: 06/22/2022 22:04   CT ABDOMEN PELVIS W CONTRAST  Result Date: 06/22/2022 CLINICAL DATA:  Positive D-dimer. Low to intermediate probability of PE. Biliary obstruction suspected. EXAM: CT ANGIOGRAPHY CHEST CT ABDOMEN AND PELVIS WITH CONTRAST TECHNIQUE: Multidetector CT imaging of the chest was performed using the standard protocol during bolus administration of intravenous contrast. Multiplanar CT image reconstructions and MIPs were obtained to evaluate the vascular anatomy. Multidetector CT imaging of the abdomen and pelvis was performed using the standard protocol during bolus administration of intravenous contrast. RADIATION DOSE REDUCTION: This exam was performed according to the departmental dose-optimization program which includes automated exposure  control, adjustment of the mA and/or kV according to patient size and/or use of iterative reconstruction technique. CONTRAST:  OMNIPAQUE IOHEXOL 350 MG/ML SOLN COMPARISON:  Chest radiograph 06/22/2022; abdominal ultrasound 06/22/2022; MRI 02/23/2022; CT abdomen and pelvis 02/22/2022 FINDINGS: CTA CHEST FINDINGS Cardiovascular: Satisfactory opacification of the pulmonary arteries to the segmental level. No evidence of pulmonary embolism. Normal heart size. No pericardial effusion. Mediastinum/Nodes: Unremarkable esophagus.  No thoracic adenopathy. Lungs/Pleura: No focal consolidation, pleural effusion, pneumothorax. Patent airways. Musculoskeletal: No acute fracture. Review of the MIP images confirms the above findings. CT ABDOMEN and PELVIS FINDINGS Hepatobiliary: Right-greater-than-left intrahepatic biliary dilation. This may be slightly increased compared to 02/22/2022. Unchanged caliber of the common bile duct measuring 8 mm diameter. No radiopaque stones. The prior pneumobilia has resolved. Unremarkable liver. Cholecystectomy. Pancreas: No acute abnormality. Spleen: Unremarkable. Adrenals/Urinary Tract: Normal adrenal glands. No urinary calculi or hydronephrosis. Unremarkable bladder. Stomach/Bowel: Normal caliber large and small bowel. No bowel wall thickening. Normal appendix. Stomach is within normal limits. Vascular/Lymphatic: No significant vascular findings are present. No enlarged abdominal or pelvic lymph nodes. Reproductive: Similar size of the left adnexal mass containing a 2.1 cm area of macroscopic fat. Right tubal ligation clips. Unremarkable uterus. Other: No free  intraperitoneal fluid or air. Musculoskeletal: No acute fracture. Review of the MIP images confirms the above findings. IMPRESSION: 1. No evidence of pulmonary embolism or other acute findings in the chest. 2. Right-greater-than-left intrahepatic biliary dilation. This may be slightly increased compared to 02/22/2022. Unchanged  caliber of the common bile duct measuring 8 mm diameter. No radiopaque stones. The asymmetric intrahepatic biliary dilation remains nonspecific with differential considerations of stricture, occult intraductal calculi, or neoplasm. 3. Similar left adnexal ovarian dermoid. Electronically Signed   By: Minerva Fester M.D.   On: 06/22/2022 22:04   US Abdomen Limited RUQ (LIVER/GB)  Result Date: 06/22/2022 CLINICAL DATA:  [id number] RUQ abdominal pain [id number] EXAM: ULTRASOUND ABDOMEN LIMITED COMPARISON:  12/31/2021 FINDINGS: The liver demonstrates normal parenchymal echogenicity and homogeneous texture without focal hepatic parenchymal lesions or intrahepatic ductal dilatation. Status post cholecystectomy. Possible stone in the proximal CBD which could be assessed further with MRCP. Common bile duct not visualized. Imaged portions of the pancreas were unremarkable. IMPRESSION: Possible shadowing stone in the CBD which was otherwise not well visualized. Status post cholecystectomy. Consider correlation with MRCP. Electronically Signed   By: Layla Maw M.D.   On: 06/22/2022 21:14   DG Chest Portable 1 View  Result Date: 06/22/2022 CLINICAL DATA:  r lower chest pain EXAM: PORTABLE CHEST - 1 VIEW COMPARISON:  02/22/2022 FINDINGS: Cardiac silhouette is unremarkable. No pneumothorax or pleural effusion. The lungs are clear. The visualized skeletal structures are unremarkable. IMPRESSION: No acute cardiopulmonary process. Electronically Signed   By: Layla Maw M.D.   On: 06/22/2022 20:36       Abbee Cremeens M.D. [facility] 06/23/2022, 5:57 PM  Available via Epic secure chat 7am-7pm After 7 pm, please refer to night coverage provider listed on amion.

## 2022-06-23 NOTE — H&P (Signed)
History and Physical    Jamie Hansen:[id number] DOB: [birth year] DOA: 06/22/2022  PCP: Patient, No Pcp Per  Patient coming from: [facility] ED  Chief Complaint: Abdominal pain  HPI: Jamie Hansen is a 27 y.o. female with medical history significant of bipolar disorder, anxiety, previous choledocholithiasis status postcholecystectomy in November 2023 and subsequent post ERCP pancreatitis.  She was admitted to the hospital in January 2024 for right upper quadrant abdominal pain and found to have hyperbilirubinemia with elevated LFTs and MRCP showing dilated bile ducts.  Underwent EUS and ERCP which did not show any specific blockages, only dilated ducts in keeping with recent instrumentation.  Patient presented to [facility] ED with nausea, vomiting, and right upper quadrant abdominal pain.  Also complained of some shortness of breath and chest discomfort.  In the ED, patient slightly tachycardic but remainder of vital signs stable.  Afebrile.  Labs showing WBC 13.9, sodium 132, AST 281, ALT 416, alk phos 358, T. bili 1.2, lipase normal, UA not suggestive of infection, urine pregnancy test negative, lactic acid normal, troponin normal, D-dimer 0.95.    CTA chest/CT abdomen pelvis with contrast showing: "IMPRESSION: 1. No evidence of pulmonary embolism or other acute findings in the chest. 2. Right-greater-than-left intrahepatic biliary dilation. This may be slightly increased compared to 02/22/2022. Unchanged caliber of the common bile duct measuring 8 mm diameter. No radiopaque stones. The asymmetric intrahepatic biliary dilation remains nonspecific with differential considerations of stricture, occult intraductal calculi, or neoplasm. 3. Similar left adnexal ovarian dermoid."  Right upper quadrant ultrasound: "IMPRESSION: Possible shadowing stone in the CBD which was otherwise not well visualized. Status post cholecystectomy. Consider correlation  with MRCP."  ED physician discussed with [facility] Dr. Levora Angel.  Due to concern for possible acute cholangitis the patient was started on antibiotics.  GI will see in consultation for possible ERCP if MRCP is positive.  Patient received Dilaudid, morphine, Zofran, Zosyn, and 1 L normal saline bolus.  Patient states she had her gallbladder removed in November and had ERCP done.  She is concerned that she has continued to have right upper quadrant abdominal pain since then which is especially worse every time she eats and associated with nausea.  She is concerned that she has been losing weight due to this problem.  Her abdominal pain has been much worse since yesterday and she has continued to feel nauseous but has not vomited.  Due to the pain she felt short of breath yesterday but denies any chest pain.  She checked her temperature at work yesterday and it was 55 F.  Review of Systems:  Review of Systems  All other systems reviewed and are negative.   Past Medical History:  Diagnosis Date   Bipolar 1 disorder (HCC)    Chlamydia    Complication of anesthesia    low BP, drowsy   Depression    Pregnancy induced hypertension     Past Surgical History:  Procedure Laterality Date   BIOPSY  02/24/2022   Procedure: BIOPSY;  Surgeon: Meridee Score, Netty Starring., MD;  Location: [facility];  Service: Gastroenterology;;   CESAREAN SECTION     CHOLECYSTECTOMY N/A 01/01/2022   Procedure: LAPAROSCOPIC CHOLECYSTECTOMY WITH ICG DYE AND INTRAOPERATIVE CHOLANGIOGRAM;  Surgeon: Berna Bue, MD;  Location: [facility];  Service: General;  Laterality: N/A;   ERCP N/A 01/02/2022   Procedure: ENDOSCOPIC RETROGRADE CHOLANGIOPANCREATOGRAPHY (ERCP);  Surgeon: Lynann Bologna, MD;  Location: [facility];  Service: Gastroenterology;  Laterality: N/A;   ERCP N/A  02/24/2022   Procedure: ENDOSCOPIC RETROGRADE CHOLANGIOPANCREATOGRAPHY (ERCP);  Surgeon: Lemar Lofty., MD;  Location: [facility];  Service:  Gastroenterology;  Laterality: N/A;   ESOPHAGOGASTRODUODENOSCOPY N/A 02/24/2022   Procedure: ESOPHAGOGASTRODUODENOSCOPY (EGD);  Surgeon: Lemar Lofty., MD;  Location: [facility];  Service: Gastroenterology;  Laterality: N/A;   HERNIA REPAIR     REMOVAL OF STONES  01/02/2022   Procedure: REMOVAL OF STONES;  Surgeon: Lynann Bologna, MD;  Location: [facility];  Service: Gastroenterology;;   REMOVAL OF STONES  02/24/2022   Procedure: REMOVAL OF STONES;  Surgeon: Lemar Lofty., MD;  Location: [facility];  Service: Gastroenterology;;   Dennison Mascot  01/02/2022   Procedure: Dennison Mascot;  Surgeon: Lynann Bologna, MD;  Location: [facility];  Service: Gastroenterology;;   TUBAL LIGATION Bilateral 02/23/2017   Procedure: POST PARTUM TUBAL LIGATION;  Surgeon: Federico Flake, MD;  Location: [facility];  Service: Gynecology;  Laterality: Bilateral;   UPPER ESOPHAGEAL ENDOSCOPIC ULTRASOUND (EUS) N/A 02/24/2022   Procedure: UPPER ESOPHAGEAL ENDOSCOPIC ULTRASOUND (EUS);  Surgeon: Lemar Lofty., MD;  Location: [facility];  Service: Gastroenterology;  Laterality: N/A;     reports that she has quit smoking. She has never used smokeless tobacco. She reports that she does not currently use alcohol. She reports that she does not use drugs.  Allergies  Allergen Reactions   Latex Itching and Other (See Comments)    Itching and yeast like symptoms with latex condom use.    Family History  Problem Relation Age of Onset   Cholecystitis Sister    Cancer Maternal Grandmother     Prior to Admission medications   Medication Sig Start Date End Date Taking? Authorizing Provider  ibuprofen (ADVIL) 200 MG tablet Take 200 mg by mouth every 6 (six) hours as needed for mild pain or fever.    [provider]  ondansetron (ZOFRAN) 4 MG tablet Take 1 tablet (4 mg total) by mouth daily as needed for nausea or vomiting. 02/25/22 02/25/23  Rhetta Mura, MD   oxyCODONE (OXY IR/ROXICODONE) 5 MG immediate release tablet Take 1 tablet (5 mg total) by mouth every 6 (six) hours as needed for severe pain. 02/25/22   Rhetta Mura, MD    Physical Exam: Vitals:   06/23/22 0030 06/23/22 0045 06/23/22 0200 06/23/22 0437  BP:  132/86 112/70 102/66  Pulse: (!) 116 (!) 105 86 67  Resp: 16 19 18 18   Temp:  98.2 F (36.8 C) (!) 97.5 F (36.4 C) 98.6 F (37 C)  TempSrc:  Oral    SpO2: 100% 97% 96% 99%  Weight:      Height:        Physical Exam Vitals reviewed.  Constitutional:      General: She is not in acute distress. HENT:     Head: Normocephalic and atraumatic.  Eyes:     Extraocular Movements: Extraocular movements intact.  Cardiovascular:     Rate and Rhythm: Normal rate and regular rhythm.     Pulses: Normal pulses.  Pulmonary:     Effort: Pulmonary effort is normal. No respiratory distress.     Breath sounds: Normal breath sounds. No wheezing or rales.  Abdominal:     General: Bowel sounds are normal. There is no distension.     Palpations: Abdomen is soft.     Tenderness: There is abdominal tenderness. There is guarding.     Comments: Right upper quadrant tender to palpation with guarding  Musculoskeletal:     Cervical back: Normal  range of motion.     Right lower leg: No edema.     Left lower leg: No edema.  Skin:    General: Skin is warm and dry.  Neurological:     General: No focal deficit present.     Mental Status: She is alert and oriented to person, place, and time.     Labs on Admission: I have personally reviewed following labs and imaging studies  CBC: Recent Labs  Lab 06/22/22 1533  WBC 13.9*  HGB 12.6  HCT 36.9  MCV 75.5*  PLT 343   Basic Metabolic Panel: Recent Labs  Lab 06/22/22 1533  NA 132*  K 3.8  CL 98  CO2 23  GLUCOSE 93  BUN 9  CREATININE 0.77  CALCIUM 8.7*   GFR: Estimated Creatinine Clearance: 79.5 mL/min (by C-G formula based on SCr of 0.77 mg/dL). Liver Function  Tests: Recent Labs  Lab 06/22/22 1533  AST 281*  ALT 416*  ALKPHOS 358*  BILITOT 1.2  PROT 8.4*  ALBUMIN 3.6   Recent Labs  Lab 06/22/22 1533  LIPASE 25   No results for input(s): "AMMONIA" in the last 168 hours. Coagulation Profile: No results for input(s): "INR", "PROTIME" in the last 168 hours. Cardiac Enzymes: No results for input(s): "CKTOTAL", "CKMB", "CKMBINDEX", "TROPONINI" in the last 168 hours. BNP (last 3 results) No results for input(s): "PROBNP" in the last 8760 hours. HbA1C: No results for input(s): "HGBA1C" in the last 72 hours. CBG: No results for input(s): "GLUCAP" in the last 168 hours. Lipid Profile: No results for input(s): "CHOL", "HDL", "LDLCALC", "TRIG", "CHOLHDL", "LDLDIRECT" in the last 72 hours. Thyroid Function Tests: No results for input(s): "TSH", "T4TOTAL", "FREET4", "T3FREE", "THYROIDAB" in the last 72 hours. Anemia Panel: No results for input(s): "VITAMINB12", "FOLATE", "FERRITIN", "TIBC", "IRON", "RETICCTPCT" in the last 72 hours. Urine analysis:    Component Value Date/Time   COLORURINE YELLOW 06/22/2022 1533   APPEARANCEUR CLEAR 06/22/2022 1533   LABSPEC 1.020 06/22/2022 1533   PHURINE 5.0 06/22/2022 1533   GLUCOSEU NEGATIVE 06/22/2022 1533   HGBUR NEGATIVE 06/22/2022 1533   BILIRUBINUR NEGATIVE 06/22/2022 1533   KETONESUR 40 (A) 06/22/2022 1533   PROTEINUR NEGATIVE 06/22/2022 1533   UROBILINOGEN 0.2 08/07/2014 1010   NITRITE NEGATIVE 06/22/2022 1533   LEUKOCYTESUR NEGATIVE 06/22/2022 1533    Radiological Exams on Admission: CT Angio Chest PE W and/or Wo Contrast  Result Date: 06/22/2022 CLINICAL DATA:  Positive D-dimer. Low to intermediate probability of PE. Biliary obstruction suspected. EXAM: CT ANGIOGRAPHY CHEST CT ABDOMEN AND PELVIS WITH CONTRAST TECHNIQUE: Multidetector CT imaging of the chest was performed using the standard protocol during bolus administration of intravenous contrast. Multiplanar CT image reconstructions  and MIPs were obtained to evaluate the vascular anatomy. Multidetector CT imaging of the abdomen and pelvis was performed using the standard protocol during bolus administration of intravenous contrast. RADIATION DOSE REDUCTION: This exam was performed according to the departmental dose-optimization program which includes automated exposure control, adjustment of the mA and/or kV according to patient size and/or use of iterative reconstruction technique. CONTRAST:  OMNIPAQUE IOHEXOL 350 MG/ML SOLN COMPARISON:  Chest radiograph 06/22/2022; abdominal ultrasound 06/22/2022; MRI 02/23/2022; CT abdomen and pelvis 02/22/2022 FINDINGS: CTA CHEST FINDINGS Cardiovascular: Satisfactory opacification of the pulmonary arteries to the segmental level. No evidence of pulmonary embolism. Normal heart size. No pericardial effusion. Mediastinum/Nodes: Unremarkable esophagus.  No thoracic adenopathy. Lungs/Pleura: No focal consolidation, pleural effusion, pneumothorax. Patent airways. Musculoskeletal: No acute fracture. Review of the MIP  images confirms the above findings. CT ABDOMEN and PELVIS FINDINGS Hepatobiliary: Right-greater-than-left intrahepatic biliary dilation. This may be slightly increased compared to 02/22/2022. Unchanged caliber of the common bile duct measuring 8 mm diameter. No radiopaque stones. The prior pneumobilia has resolved. Unremarkable liver. Cholecystectomy. Pancreas: No acute abnormality. Spleen: Unremarkable. Adrenals/Urinary Tract: Normal adrenal glands. No urinary calculi or hydronephrosis. Unremarkable bladder. Stomach/Bowel: Normal caliber large and small bowel. No bowel wall thickening. Normal appendix. Stomach is within normal limits. Vascular/Lymphatic: No significant vascular findings are present. No enlarged abdominal or pelvic lymph nodes. Reproductive: Similar size of the left adnexal mass containing a 2.1 cm area of macroscopic fat. Right tubal ligation clips. Unremarkable uterus. Other:  No free intraperitoneal fluid or air. Musculoskeletal: No acute fracture. Review of the MIP images confirms the above findings. IMPRESSION: 1. No evidence of pulmonary embolism or other acute findings in the chest. 2. Right-greater-than-left intrahepatic biliary dilation. This may be slightly increased compared to 02/22/2022. Unchanged caliber of the common bile duct measuring 8 mm diameter. No radiopaque stones. The asymmetric intrahepatic biliary dilation remains nonspecific with differential considerations of stricture, occult intraductal calculi, or neoplasm. 3. Similar left adnexal ovarian dermoid. Electronically Signed   By: Minerva Fester M.D.   On: 06/22/2022 22:04   CT ABDOMEN PELVIS W CONTRAST  Result Date: 06/22/2022 CLINICAL DATA:  Positive D-dimer. Low to intermediate probability of PE. Biliary obstruction suspected. EXAM: CT ANGIOGRAPHY CHEST CT ABDOMEN AND PELVIS WITH CONTRAST TECHNIQUE: Multidetector CT imaging of the chest was performed using the standard protocol during bolus administration of intravenous contrast. Multiplanar CT image reconstructions and MIPs were obtained to evaluate the vascular anatomy. Multidetector CT imaging of the abdomen and pelvis was performed using the standard protocol during bolus administration of intravenous contrast. RADIATION DOSE REDUCTION: This exam was performed according to the departmental dose-optimization program which includes automated exposure control, adjustment of the mA and/or kV according to patient size and/or use of iterative reconstruction technique. CONTRAST:  OMNIPAQUE IOHEXOL 350 MG/ML SOLN COMPARISON:  Chest radiograph 06/22/2022; abdominal ultrasound 06/22/2022; MRI 02/23/2022; CT abdomen and pelvis 02/22/2022 FINDINGS: CTA CHEST FINDINGS Cardiovascular: Satisfactory opacification of the pulmonary arteries to the segmental level. No evidence of pulmonary embolism. Normal heart size. No pericardial effusion. Mediastinum/Nodes:  Unremarkable esophagus.  No thoracic adenopathy. Lungs/Pleura: No focal consolidation, pleural effusion, pneumothorax. Patent airways. Musculoskeletal: No acute fracture. Review of the MIP images confirms the above findings. CT ABDOMEN and PELVIS FINDINGS Hepatobiliary: Right-greater-than-left intrahepatic biliary dilation. This may be slightly increased compared to 02/22/2022. Unchanged caliber of the common bile duct measuring 8 mm diameter. No radiopaque stones. The prior pneumobilia has resolved. Unremarkable liver. Cholecystectomy. Pancreas: No acute abnormality. Spleen: Unremarkable. Adrenals/Urinary Tract: Normal adrenal glands. No urinary calculi or hydronephrosis. Unremarkable bladder. Stomach/Bowel: Normal caliber large and small bowel. No bowel wall thickening. Normal appendix. Stomach is within normal limits. Vascular/Lymphatic: No significant vascular findings are present. No enlarged abdominal or pelvic lymph nodes. Reproductive: Similar size of the left adnexal mass containing a 2.1 cm area of macroscopic fat. Right tubal ligation clips. Unremarkable uterus. Other: No free intraperitoneal fluid or air. Musculoskeletal: No acute fracture. Review of the MIP images confirms the above findings. IMPRESSION: 1. No evidence of pulmonary embolism or other acute findings in the chest. 2. Right-greater-than-left intrahepatic biliary dilation. This may be slightly increased compared to 02/22/2022. Unchanged caliber of the common bile duct measuring 8 mm diameter. No radiopaque stones. The asymmetric intrahepatic biliary dilation remains nonspecific with differential considerations of  stricture, occult intraductal calculi, or neoplasm. 3. Similar left adnexal ovarian dermoid. Electronically Signed   By: Minerva Fester M.D.   On: 06/22/2022 22:04   US Abdomen Limited RUQ (LIVER/GB)  Result Date: 06/22/2022 CLINICAL DATA:  [id number] RUQ abdominal pain [id number] EXAM: ULTRASOUND ABDOMEN LIMITED COMPARISON:   12/31/2021 FINDINGS: The liver demonstrates normal parenchymal echogenicity and homogeneous texture without focal hepatic parenchymal lesions or intrahepatic ductal dilatation. Status post cholecystectomy. Possible stone in the proximal CBD which could be assessed further with MRCP. Common bile duct not visualized. Imaged portions of the pancreas were unremarkable. IMPRESSION: Possible shadowing stone in the CBD which was otherwise not well visualized. Status post cholecystectomy. Consider correlation with MRCP. Electronically Signed   By: Layla Maw M.D.   On: 06/22/2022 21:14   DG Chest Portable 1 View  Result Date: 06/22/2022 CLINICAL DATA:  r lower chest pain EXAM: PORTABLE CHEST - 1 VIEW COMPARISON:  02/22/2022 FINDINGS: Cardiac silhouette is unremarkable. No pneumothorax or pleural effusion. The lungs are clear. The visualized skeletal structures are unremarkable. IMPRESSION: No acute cardiopulmonary process. Electronically Signed   By: Layla Maw M.D.   On: 06/22/2022 20:36    EKG: Independently reviewed.  Sinus rhythm, no significant change since prior tracing.  Assessment and Plan  Elevated liver enzymes Intrahepatic biliary dilation/?choledocholithiasis Patient with history of cholecystectomy presenting with nausea and right upper quadrant abdominal pain.  Liver enzymes elevated with normal lipase.  CT showing asymmetric intrahepatic biliary dilation and ultrasound showing possible CBD stone.  Patient is no longer tachycardic. She reports fever at home but has been afebrile since arrival to the ED.  Mild leukocytosis on labs but no lactic acidosis.  She is not hypotensive.  ?Acute cholangitis.  No signs of sepsis at this time.  GI consulted and MRCP ordered for further evaluation.  Keep n.p.o., continue Zosyn, IV fluid hydration, IV antiemetic as needed, and IV analgesic as needed.  Monitor WBC count and LFTs.  Mild hyponatremia Continue IV fluid hydration and monitor  labs.  Bipolar disorder, anxiety Pharmacy med rec pending.  DVT prophylaxis: SCDs Code Status: Full Code (discussed with the patient) Family Communication: No family available at this time. Level of care: Med-Surg Admission status: It is my clinical opinion that admission to INPATIENT is reasonable and necessary because of the expectation that this patient will require hospital care that crosses at least 2 midnights to treat this condition based on the medical complexity of the problems presented.  Given the aforementioned information, the predictability of an adverse outcome is felt to be significant.   John Giovanni MD [facility]  If 7PM-7AM, please contact night-coverage [email or web address]  06/23/2022, 5:08 AM

## 2022-06-23 NOTE — Consult Note (Signed)
[facility] Gastroenterology Consult  Referring Provider: No ref. provider found Primary Care Physician:  Patient, No Pcp Per Primary Gastroenterologist: Unassigned  Reason for Consultation: Elevated liver enzymes  SUBJECTIVE:   HPI: Jamie Hansen is a 27 y.o. female with no significant past medical history.  She underwent laparoscopic cholecystectomy on 01/01/2022, intraoperative cholangiogram showed choledocholithiasis.  She subsequently had ERCP with done 01/02/2022 with a balloon sweep removing stones, there was possible post ERCP pancreatitis.  She was admitted to hospital early January 2024 for recurrent pain, underwent endoscopic ultrasound/ERCP with Dr. Irish Lack Roddy with findings of common bile duct dilatation suspected choledocholithiasis, prior sphincterotomy, small sludge seen on balloon sweep, antrum erythema, normal-appearing pancreas and liver, no malignant nodes.  She was seen in office by Dr. Bosie Clos on 03/05/2020 for had improvement of pain.  Presented to hospital on 06/22/2022 with chest pain and shortness of breath.  CT imaging showed right greater than left intrahepatic biliary dilatation, common bile duct 8 mm, resolution of pneumobilia, status post cholecystectomy.  MRCP this a.m. showed choledocholithiasis.  Patient was seen and evaluated prior to her MRCP this morning.  She noted having worsening shortness of breath with pain.  Pain is located in right upper quadrant.  She was having nausea and vomiting prior to admission.  She was having low-grade fever prior to admission.  Labs showed AST/ALT 281/416, total bilirubin 1.7, alkaline phosphatase 301, hemoglobin 11.3, WBC 13.1, lipase 25.  Past Medical History:  Diagnosis Date   Bipolar 1 disorder (HCC)    Chlamydia    Complication of anesthesia    low BP, drowsy   Depression    Pregnancy induced hypertension    Past Surgical History:  Procedure Laterality Date   BIOPSY  02/24/2022   Procedure: BIOPSY;  Surgeon:  Meridee Score, Netty Starring., MD;  Location: [facility];  Service: Gastroenterology;;   CESAREAN SECTION     CHOLECYSTECTOMY N/A 01/01/2022   Procedure: LAPAROSCOPIC CHOLECYSTECTOMY WITH ICG DYE AND INTRAOPERATIVE CHOLANGIOGRAM;  Surgeon: Berna Bue, MD;  Location: [facility];  Service: General;  Laterality: N/A;   ERCP N/A 01/02/2022   Procedure: ENDOSCOPIC RETROGRADE CHOLANGIOPANCREATOGRAPHY (ERCP);  Surgeon: Lynann Bologna, MD;  Location: [facility];  Service: Gastroenterology;  Laterality: N/A;   ERCP N/A 02/24/2022   Procedure: ENDOSCOPIC RETROGRADE CHOLANGIOPANCREATOGRAPHY (ERCP);  Surgeon: Lemar Lofty., MD;  Location: [facility];  Service: Gastroenterology;  Laterality: N/A;   ESOPHAGOGASTRODUODENOSCOPY N/A 02/24/2022   Procedure: ESOPHAGOGASTRODUODENOSCOPY (EGD);  Surgeon: Lemar Lofty., MD;  Location: [facility];  Service: Gastroenterology;  Laterality: N/A;   HERNIA REPAIR     REMOVAL OF STONES  01/02/2022   Procedure: REMOVAL OF STONES;  Surgeon: Lynann Bologna, MD;  Location: [facility];  Service: Gastroenterology;;   REMOVAL OF STONES  02/24/2022   Procedure: REMOVAL OF STONES;  Surgeon: Lemar Lofty., MD;  Location: [facility];  Service: Gastroenterology;;   Dennison Mascot  01/02/2022   Procedure: Dennison Mascot;  Surgeon: Lynann Bologna, MD;  Location: [facility];  Service: Gastroenterology;;   TUBAL LIGATION Bilateral 02/23/2017   Procedure: POST PARTUM TUBAL LIGATION;  Surgeon: Federico Flake, MD;  Location: [facility];  Service: Gynecology;  Laterality: Bilateral;   UPPER ESOPHAGEAL ENDOSCOPIC ULTRASOUND (EUS) N/A 02/24/2022   Procedure: UPPER ESOPHAGEAL ENDOSCOPIC ULTRASOUND (EUS);  Surgeon: Lemar Lofty., MD;  Location: [facility];  Service: Gastroenterology;  Laterality: N/A;   Prior to Admission medications   Medication Sig Start Date End Date Taking? Authorizing Provider  ibuprofen (ADVIL) 200 MG tablet Take  200  mg by mouth every 6 (six) hours as needed for mild pain or fever.    [provider]  ondansetron (ZOFRAN) 4 MG tablet Take 1 tablet (4 mg total) by mouth daily as needed for nausea or vomiting. 02/25/22 02/25/23  Rhetta Mura, MD  oxyCODONE (OXY IR/ROXICODONE) 5 MG immediate release tablet Take 1 tablet (5 mg total) by mouth every 6 (six) hours as needed for severe pain. 02/25/22   Rhetta Mura, MD   Current Facility-Administered Medications  Medication Dose Route Frequency Provider Last Rate Last Admin   0.9 %  sodium chloride infusion   Intravenous Continuous John Giovanni, MD 125 mL/hr at 06/23/22 0623 New Bag at 06/23/22 1610   HYDROmorphone (DILAUDID) injection 1 mg  1 mg Intravenous Q4H PRN Rai, Ripudeep K, MD   1 mg at 06/23/22 0749   naloxone (NARCAN) injection 0.4 mg  0.4 mg Intravenous PRN John Giovanni, MD       ondansetron [facility]) injection 4 mg  4 mg Intravenous Q6H PRN John Giovanni, MD       piperacillin-tazobactam (ZOSYN) IVPB 3.375 g  3.375 g Intravenous Q8H Hall, Carole N, DO 12.5 mL/hr at 06/23/22 0623 3.375 g at 06/23/22 9604   Allergies as of 06/22/2022 - Review Complete 06/22/2022  Allergen Reaction Noted   Latex Itching and Other (See Comments) 08/07/2014   Family History  Problem Relation Age of Onset   Cholecystitis Sister    Cancer Maternal Grandmother    Social History   Socioeconomic History   Marital status: Divorced    Spouse name: Not on file   Number of children: Not on file   Years of education: Not on file   Highest education level: Not on file  Occupational History   Not on file  Tobacco Use   Smoking status: Former   Smokeless tobacco: Never  Vaping Use   Vaping Use: Never used  Substance and Sexual Activity   Alcohol use: Not Currently    Comment: occ   Drug use: No   Sexual activity: Yes    Partners: Female    Birth control/protection: Surgical    Comment: 1ST INTERCOURSE- 14, PARTNERS- 3  Other  Topics Concern   Not on file  Social History Narrative   Not on file   Social Determinants of Health   Financial Resource Strain: Not on file  Food Insecurity: No Food Insecurity (06/23/2022)   Hunger Vital Sign    Worried About Running Out of Food in the Last Year: Never true    Ran Out of Food in the Last Year: Never true  Transportation Needs: No Transportation Needs (06/23/2022)   PRAPARE - Administrator, Civil Service (Medical): No    Lack of Transportation (Non-Medical): No  Physical Activity: Not on file  Stress: Not on file  Social Connections: Not on file  Intimate Partner Violence: Not At Risk (06/23/2022)   Humiliation, Afraid, Rape, and Kick questionnaire    Fear of Current or Ex-Partner: No    Emotionally Abused: No    Physically Abused: No    Sexually Abused: No   Review of Systems:  Review of Systems  Constitutional:  Positive for fever.  Respiratory:  Positive for shortness of breath.   Cardiovascular:  Positive for chest pain.  Gastrointestinal:  Positive for abdominal pain and nausea.    OBJECTIVE:   Temp:  [97.5 F (36.4 C)-98.6 F (37 C)] 98 F (36.7 C) (05/01 0748) Pulse Rate:  [  67-116] 78 (05/01 0748) Resp:  [16-27] 18 (05/01 0748) BP: (102-138)/(66-109) 103/74 (05/01 0748) SpO2:  [96 %-100 %] 100 % (05/01 0748) Weight:  [54.4 kg] 54.4 kg (04/30 1530)   Physical Exam Constitutional:      General: She is not in acute distress.    Appearance: She is not ill-appearing, toxic-appearing or diaphoretic.  Cardiovascular:     Rate and Rhythm: Normal rate and regular rhythm.  Pulmonary:     Effort: No respiratory distress.     Breath sounds: Normal breath sounds.     Comments: No supplemental oxygen in place Abdominal:     General: Bowel sounds are normal. There is no distension.     Palpations: Abdomen is soft.     Tenderness: There is abdominal tenderness. There is no guarding.  Neurological:     Mental Status: She is alert.      Labs: Recent Labs    06/22/22 1533 06/23/22 0559  WBC 13.9* 13.1*  HGB 12.6 11.3*  HCT 36.9 32.5*  PLT 343 319   BMET Recent Labs    06/22/22 1533 06/23/22 0559  NA 132* 133*  K 3.8 3.4*  CL 98 100  CO2 23 23  GLUCOSE 93 112*  BUN 9 5*  CREATININE 0.77 0.76  CALCIUM 8.7* 8.2*   LFT Recent Labs    06/23/22 0559  PROT 7.0  ALBUMIN 2.9*  AST 122*  ALT 295*  ALKPHOS 301*  BILITOT 1.7*   PT/INR No results for input(s): "LABPROT", "INR" in the last 72 hours.  Diagnostic imaging: MR ABDOMEN MRCP W WO CONTAST  Result Date: 06/23/2022 CLINICAL DATA:  History of cholecystectomy complicated by ERCP induced pancreatitis presenting with fever and right upper quadrant abdominal and chest pain EXAM: MRI ABDOMEN WITHOUT AND WITH CONTRAST (INCLUDING MRCP) TECHNIQUE: Multiplanar multisequence MR imaging of the abdomen was performed both before and after the administration of intravenous contrast. Heavily T2-weighted images of the biliary and pancreatic ducts were obtained, and three-dimensional MRCP images were rendered by post processing. CONTRAST:  5mL GADAVIST GADOBUTROL 1 MMOL/ML IV SOLN COMPARISON:  CT abdomen and pelvis dated 06/22/2022, MRCP dated 02/23/2022 FINDINGS: Lower chest: Left lower lobe linear atelectasis. Hepatobiliary: Heterogeneous T2 hyperintense parenchymal signal of the right lobe associated with diffusion restriction and diffuse heterogeneous enhancement of the right hepatic lobe. Increased intrahepatic bile duct dilation measuring up to 11 mm from 3 mm on the right and extrahepatic bile duct dilation measuring 8 mm, previously 5 mm, with multifocal filling defects noted in the downstream right intrahepatic duct at the confluence and within the downstream common bile duct. Associated right intrahepatic biliary enhancement. There is marked effacement of the intrahepatic bile duct at the confluence (7:85). Cholecystectomy. Pancreas: No mass, inflammatory changes, or  other parenchymal abnormality identified. Spleen:  Within normal limits in size and appearance. Adrenals/Urinary Tract: No adrenal nodules. No suspicious renal masses identified. No evidence of hydronephrosis. Stomach/Bowel: Visualized portions within the abdomen are unremarkable. Vascular/Lymphatic: No pathologically enlarged lymph nodes identified. No abdominal aortic aneurysm demonstrated. 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 Trace perisplenic free fluid. Musculoskeletal: No suspicious bone lesions identified. IMPRESSION: Findings of choledocholithiasis with increased intra and extrahepatic bile duct dilation and associated cholangitis. Marked effacement of the intrahepatic bile duct at the confluence, suspicious for underlying stenosis. Electronically Signed   By: Agustin Cree M.D.   On: 06/23/2022 09:57   MR 3D Recon At Scanner  Result Date: 06/23/2022 CLINICAL DATA:  History of cholecystectomy  complicated by ERCP induced pancreatitis presenting with fever and right upper quadrant abdominal and chest pain EXAM: MRI ABDOMEN WITHOUT AND WITH CONTRAST (INCLUDING MRCP) TECHNIQUE: Multiplanar multisequence MR imaging of the abdomen was performed both before and after the administration of intravenous contrast. Heavily T2-weighted images of the biliary and pancreatic ducts were obtained, and three-dimensional MRCP images were rendered by post processing. CONTRAST:  5mL GADAVIST GADOBUTROL 1 MMOL/ML IV SOLN COMPARISON:  CT abdomen and pelvis dated 06/22/2022, MRCP dated 02/23/2022 FINDINGS: Lower chest: Left lower lobe linear atelectasis. Hepatobiliary: Heterogeneous T2 hyperintense parenchymal signal of the right lobe associated with diffusion restriction and diffuse heterogeneous enhancement of the right hepatic lobe. Increased intrahepatic bile duct dilation measuring up to 11 mm from 3 mm on the right and extrahepatic bile duct dilation measuring 8 mm, previously 5 mm, with multifocal  filling defects noted in the downstream right intrahepatic duct at the confluence and within the downstream common bile duct. Associated right intrahepatic biliary enhancement. There is marked effacement of the intrahepatic bile duct at the confluence (7:85). Cholecystectomy. Pancreas: No mass, inflammatory changes, or other parenchymal abnormality identified. Spleen:  Within normal limits in size and appearance. Adrenals/Urinary Tract: No adrenal nodules. No suspicious renal masses identified. No evidence of hydronephrosis. Stomach/Bowel: Visualized portions within the abdomen are unremarkable. Vascular/Lymphatic: No pathologically enlarged lymph nodes identified. No abdominal aortic aneurysm demonstrated. 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 Trace perisplenic free fluid. Musculoskeletal: No suspicious bone lesions identified. IMPRESSION: Findings of choledocholithiasis with increased intra and extrahepatic bile duct dilation and associated cholangitis. Marked effacement of the intrahepatic bile duct at the confluence, suspicious for underlying stenosis. Electronically Signed   By: Agustin Cree M.D.   On: 06/23/2022 09:57   CT Angio Chest PE W and/or Wo Contrast  Result Date: 06/22/2022 CLINICAL DATA:  Positive D-dimer. Low to intermediate probability of PE. Biliary obstruction suspected. EXAM: CT ANGIOGRAPHY CHEST CT ABDOMEN AND PELVIS WITH CONTRAST TECHNIQUE: Multidetector CT imaging of the chest was performed using the standard protocol during bolus administration of intravenous contrast. Multiplanar CT image reconstructions and MIPs were obtained to evaluate the vascular anatomy. Multidetector CT imaging of the abdomen and pelvis was performed using the standard protocol during bolus administration of intravenous contrast. RADIATION DOSE REDUCTION: This exam was performed according to the departmental dose-optimization program which includes automated exposure control, adjustment of the  mA and/or kV according to patient size and/or use of iterative reconstruction technique. CONTRAST:  OMNIPAQUE IOHEXOL 350 MG/ML SOLN COMPARISON:  Chest radiograph 06/22/2022; abdominal ultrasound 06/22/2022; MRI 02/23/2022; CT abdomen and pelvis 02/22/2022 FINDINGS: CTA CHEST FINDINGS Cardiovascular: Satisfactory opacification of the pulmonary arteries to the segmental level. No evidence of pulmonary embolism. Normal heart size. No pericardial effusion. Mediastinum/Nodes: Unremarkable esophagus.  No thoracic adenopathy. Lungs/Pleura: No focal consolidation, pleural effusion, pneumothorax. Patent airways. Musculoskeletal: No acute fracture. Review of the MIP images confirms the above findings. CT ABDOMEN and PELVIS FINDINGS Hepatobiliary: Right-greater-than-left intrahepatic biliary dilation. This may be slightly increased compared to 02/22/2022. Unchanged caliber of the common bile duct measuring 8 mm diameter. No radiopaque stones. The prior pneumobilia has resolved. Unremarkable liver. Cholecystectomy. Pancreas: No acute abnormality. Spleen: Unremarkable. Adrenals/Urinary Tract: Normal adrenal glands. No urinary calculi or hydronephrosis. Unremarkable bladder. Stomach/Bowel: Normal caliber large and small bowel. No bowel wall thickening. Normal appendix. Stomach is within normal limits. Vascular/Lymphatic: No significant vascular findings are present. No enlarged abdominal or pelvic lymph nodes. Reproductive: Similar size of the left adnexal mass containing  a 2.1 cm area of macroscopic fat. Right tubal ligation clips. Unremarkable uterus. Other: No free intraperitoneal fluid or air. Musculoskeletal: No acute fracture. Review of the MIP images confirms the above findings. IMPRESSION: 1. No evidence of pulmonary embolism or other acute findings in the chest. 2. Right-greater-than-left intrahepatic biliary dilation. This may be slightly increased compared to 02/22/2022. Unchanged caliber of the common bile duct  measuring 8 mm diameter. No radiopaque stones. The asymmetric intrahepatic biliary dilation remains nonspecific with differential considerations of stricture, occult intraductal calculi, or neoplasm. 3. Similar left adnexal ovarian dermoid. Electronically Signed   By: Minerva Fester M.D.   On: 06/22/2022 22:04   CT ABDOMEN PELVIS W CONTRAST  Result Date: 06/22/2022 CLINICAL DATA:  Positive D-dimer. Low to intermediate probability of PE. Biliary obstruction suspected. EXAM: CT ANGIOGRAPHY CHEST CT ABDOMEN AND PELVIS WITH CONTRAST TECHNIQUE: Multidetector CT imaging of the chest was performed using the standard protocol during bolus administration of intravenous contrast. Multiplanar CT image reconstructions and MIPs were obtained to evaluate the vascular anatomy. Multidetector CT imaging of the abdomen and pelvis was performed using the standard protocol during bolus administration of intravenous contrast. RADIATION DOSE REDUCTION: This exam was performed according to the departmental dose-optimization program which includes automated exposure control, adjustment of the mA and/or kV according to patient size and/or use of iterative reconstruction technique. CONTRAST:  OMNIPAQUE IOHEXOL 350 MG/ML SOLN COMPARISON:  Chest radiograph 06/22/2022; abdominal ultrasound 06/22/2022; MRI 02/23/2022; CT abdomen and pelvis 02/22/2022 FINDINGS: CTA CHEST FINDINGS Cardiovascular: Satisfactory opacification of the pulmonary arteries to the segmental level. No evidence of pulmonary embolism. Normal heart size. No pericardial effusion. Mediastinum/Nodes: Unremarkable esophagus.  No thoracic adenopathy. Lungs/Pleura: No focal consolidation, pleural effusion, pneumothorax. Patent airways. Musculoskeletal: No acute fracture. Review of the MIP images confirms the above findings. CT ABDOMEN and PELVIS FINDINGS Hepatobiliary: Right-greater-than-left intrahepatic biliary dilation. This may be slightly increased compared to  02/22/2022. Unchanged caliber of the common bile duct measuring 8 mm diameter. No radiopaque stones. The prior pneumobilia has resolved. Unremarkable liver. Cholecystectomy. Pancreas: No acute abnormality. Spleen: Unremarkable. Adrenals/Urinary Tract: Normal adrenal glands. No urinary calculi or hydronephrosis. Unremarkable bladder. Stomach/Bowel: Normal caliber large and small bowel. No bowel wall thickening. Normal appendix. Stomach is within normal limits. Vascular/Lymphatic: No significant vascular findings are present. No enlarged abdominal or pelvic lymph nodes. Reproductive: Similar size of the left adnexal mass containing a 2.1 cm area of macroscopic fat. Right tubal ligation clips. Unremarkable uterus. Other: No free intraperitoneal fluid or air. Musculoskeletal: No acute fracture. Review of the MIP images confirms the above findings. IMPRESSION: 1. No evidence of pulmonary embolism or other acute findings in the chest. 2. Right-greater-than-left intrahepatic biliary dilation. This may be slightly increased compared to 02/22/2022. Unchanged caliber of the common bile duct measuring 8 mm diameter. No radiopaque stones. The asymmetric intrahepatic biliary dilation remains nonspecific with differential considerations of stricture, occult intraductal calculi, or neoplasm. 3. Similar left adnexal ovarian dermoid. Electronically Signed   By: Minerva Fester M.D.   On: 06/22/2022 22:04   US Abdomen Limited RUQ (LIVER/GB)  Result Date: 06/22/2022 CLINICAL DATA:  [id number] RUQ abdominal pain [id number] EXAM: ULTRASOUND ABDOMEN LIMITED COMPARISON:  12/31/2021 FINDINGS: The liver demonstrates normal parenchymal echogenicity and homogeneous texture without focal hepatic parenchymal lesions or intrahepatic ductal dilatation. Status post cholecystectomy. Possible stone in the proximal CBD which could be assessed further with MRCP. Common bile duct not visualized. Imaged portions of the pancreas were unremarkable.  IMPRESSION: Possible shadowing stone  in the CBD which was otherwise not well visualized. Status post cholecystectomy. Consider correlation with MRCP. Electronically Signed   By: Layla Maw M.D.   On: 06/22/2022 21:14   DG Chest Portable 1 View  Result Date: 06/22/2022 CLINICAL DATA:  r lower chest pain EXAM: PORTABLE CHEST - 1 VIEW COMPARISON:  02/22/2022 FINDINGS: Cardiac silhouette is unremarkable. No pneumothorax or pleural effusion. The lungs are clear. The visualized skeletal structures are unremarkable. IMPRESSION: No acute cardiopulmonary process. Electronically Signed   By: Layla Maw M.D.   On: 06/22/2022 20:36    IMPRESSION: Choledocholithiasis  -Status postcholecystectomy November 2023 Transaminase elevation in mixed pattern Right upper quadrant pain secondary to above  PLAN: -MRCP findings reviewed by advanced endoscopist, tentative plan for ERCP 06/24/2022 with Dr. Ewing Schlein -Trend liver enzyme panel -Continue antibiotic therapy -Further recommendations to follow pending endoscopy   LOS: 0 days   Liliane Shi, [facility]

## 2022-06-23 NOTE — Anesthesia Preprocedure Evaluation (Addendum)
Anesthesia Evaluation  Patient identified by MRN, date of birth, ID band Patient awake    Reviewed: Allergy & Precautions, NPO status , Patient's Chart, lab work & pertinent test results  Airway Mallampati: II  TM Distance: >3 FB Neck ROM: Full    Dental no notable dental hx. (+) Teeth Intact, Dental Advisory Given   Pulmonary former smoker   Pulmonary exam normal breath sounds clear to auscultation       Cardiovascular hypertension, Normal cardiovascular exam Rhythm:Regular Rate:Normal     Neuro/Psych  PSYCHIATRIC DISORDERS Anxiety Depression Bipolar Disorder   negative neurological ROS     GI/Hepatic negative GI ROS, Neg liver ROS,,,  Endo/Other  negative endocrine ROS    Renal/GU negative Renal ROS  negative genitourinary   Musculoskeletal negative musculoskeletal ROS (+)    Abdominal   Peds  Hematology negative hematology ROS (+)   Anesthesia Other Findings   Reproductive/Obstetrics                             Anesthesia Physical Anesthesia Plan  ASA: 2  Anesthesia Plan: General   Post-op Pain Management: Tylenol PO (pre-op)*   Induction: Intravenous  PONV Risk Score and Plan: 3 and Midazolam, Dexamethasone and Ondansetron  Airway Management Planned: Oral ETT  Additional Equipment:   Intra-op Plan:   Post-operative Plan: Extubation in OR  Informed Consent: I have reviewed the patients History and Physical, chart, labs and discussed the procedure including the risks, benefits and alternatives for the proposed anesthesia with the patient or authorized representative who has indicated his/her understanding and acceptance.     Dental advisory given  Plan Discussed with: CRNA  Anesthesia Plan Comments:        Anesthesia Quick Evaluation

## 2022-06-24 ENCOUNTER — Encounter (HOSPITAL_COMMUNITY)
Admission: EM | Disposition: A | Discharge status: Home / Self Care | Payer: Self-pay | Source: Home / Self Care | Attending: Internal Medicine

## 2022-06-24 ENCOUNTER — Inpatient Hospital Stay (HOSPITAL_COMMUNITY): Payer: BC Managed Care – PPO

## 2022-06-24 ENCOUNTER — Inpatient Hospital Stay (HOSPITAL_COMMUNITY): Payer: BC Managed Care – PPO | Admitting: Anesthesiology

## 2022-06-24 ENCOUNTER — Encounter (HOSPITAL_COMMUNITY): Payer: Self-pay | Admitting: Internal Medicine

## 2022-06-24 DIAGNOSIS — K8051 Calculus of bile duct without cholangitis or cholecystitis with obstruction: Secondary | ICD-10-CM | POA: Diagnosis present

## 2022-06-24 DIAGNOSIS — K8309 Other cholangitis: Secondary | ICD-10-CM

## 2022-06-24 HISTORY — PX: SPHINCTEROTOMY: SHX5544

## 2022-06-24 HISTORY — PX: BILIARY STENT PLACEMENT: SHX5538

## 2022-06-24 HISTORY — PX: ERCP: SHX5425

## 2022-06-24 HISTORY — PX: REMOVAL OF STONES: SHX5545

## 2022-06-24 LAB — COMPREHENSIVE METABOLIC PANEL
ALT: 166 U/L — ABNORMAL HIGH (ref 0–44)
AST: 52 U/L — ABNORMAL HIGH (ref 15–41)
Albumin: 2.9 g/dL — ABNORMAL LOW (ref 3.5–5.0)
Alkaline Phosphatase: 256 U/L — ABNORMAL HIGH (ref 38–126)
Anion gap: 9 (ref 5–15)
BUN: <5 mg/dL — ABNORMAL LOW (ref 6–20)
CO2: 25 mmol/L (ref 22–32)
Calcium: 8.1 mg/dL — ABNORMAL LOW (ref 8.9–10.3)
Chloride: 101 mmol/L (ref 98–111)
Creatinine, Ser: 0.73 mg/dL (ref 0.44–1.00)
GFR, Estimated: >60 mL/min (ref >60)
Glucose, Bld: 138 mg/dL — ABNORMAL HIGH (ref 70–99)
Potassium: 3.5 mmol/L (ref 3.5–5.1)
Sodium: 135 mmol/L (ref 135–145)
Total Bilirubin: 1.5 mg/dL — ABNORMAL HIGH (ref 0.3–1.2)
Total Protein: 6.9 g/dL (ref 6.5–8.1)

## 2022-06-24 LAB — CBC WITH DIFFERENTIAL/PLATELET
Abs Immature Granulocytes: 0.06 10*3/uL (ref 0.00–0.07)
Basophils Absolute: 0 10*3/uL (ref 0.0–0.1)
Basophils Relative: 0 %
Eosinophils Absolute: 0.2 10*3/uL (ref 0.0–0.5)
Eosinophils Relative: 2 %
HCT: 30.4 % — ABNORMAL LOW (ref 36.0–46.0)
Hemoglobin: 10 g/dL — ABNORMAL LOW (ref 12.0–15.0)
Immature Granulocytes: 0 %
Lymphocytes Relative: 6 %
Lymphs Abs: 0.8 10*3/uL (ref 0.7–4.0)
MCH: 25.6 pg — ABNORMAL LOW (ref 26.0–34.0)
MCHC: 32.9 g/dL (ref 30.0–36.0)
MCV: 77.9 fL — ABNORMAL LOW (ref 80.0–100.0)
Monocytes Absolute: 0.3 10*3/uL (ref 0.1–1.0)
Monocytes Relative: 2 %
Neutro Abs: 12 10*3/uL — ABNORMAL HIGH (ref 1.7–7.7)
Neutrophils Relative %: 90 %
Platelets: 269 10*3/uL (ref 150–400)
RBC: 3.9 MIL/uL (ref 3.87–5.11)
RDW: 15.3 % (ref 11.5–15.5)
WBC: 13.3 10*3/uL — ABNORMAL HIGH (ref 4.0–10.5)
nRBC: 0 % (ref 0.0–0.2)

## 2022-06-24 LAB — RETICULOCYTES
Immature Retic Fract: 10.5 % (ref 2.3–15.9)
RBC.: 3.88 MIL/uL (ref 3.87–5.11)
Retic Count, Absolute: 70.6 10*3/uL (ref 19.0–186.0)
Retic Ct Pct: 1.8 % (ref 0.4–3.1)

## 2022-06-24 LAB — IRON AND TIBC
Iron: 10 ug/dL — ABNORMAL LOW (ref 28–170)
Saturation Ratios: 4 % — ABNORMAL LOW (ref 10.4–31.8)
TIBC: 287 ug/dL (ref 250–450)
UIBC: 277 ug/dL

## 2022-06-24 LAB — VITAMIN B12: Vitamin B-12: 584 pg/mL (ref 180–914)

## 2022-06-24 LAB — FOLATE: Folate: 17.7 ng/mL (ref >5.9)

## 2022-06-24 LAB — CULTURE, BLOOD (ROUTINE X 2)

## 2022-06-24 LAB — FERRITIN: Ferritin: 123 ng/mL (ref 11–307)

## 2022-06-24 SURGERY — ERCP, WITH INTERVENTION IF INDICATED
Anesthesia: General

## 2022-06-24 MED ORDER — LACTATED RINGERS IV SOLN: INTRAVENOUS | Status: DC | PRN

## 2022-06-24 MED ORDER — POLYSACCHARIDE IRON COMPLEX 150 MG PO CAPS
150.0000 mg | ORAL_CAPSULE | Freq: Every day | ORAL | Status: DC
  Administered 2022-06-24 – 2022-06-25 (×2): 150 mg via ORAL
  Filled 2022-06-24 (×2): qty 1

## 2022-06-24 MED ORDER — FENTANYL CITRATE (PF) 100 MCG/2ML IJ SOLN
50.0000 ug | Freq: Once | INTRAMUSCULAR | Status: AC
  Administered 2022-06-24: 50 ug via INTRAVENOUS

## 2022-06-24 MED ORDER — FENTANYL CITRATE (PF) 100 MCG/2ML IJ SOLN
INTRAMUSCULAR | Status: AC
  Filled 2022-06-24: qty 2

## 2022-06-24 MED ORDER — FENTANYL CITRATE (PF) 100 MCG/2ML IJ SOLN
INTRAMUSCULAR | Status: DC | PRN
  Administered 2022-06-24: 50 ug via INTRAVENOUS

## 2022-06-24 MED ORDER — PHENOL 1.4 % MT LIQD
1.0000 | OROMUCOSAL | Status: DC | PRN
  Administered 2022-06-24: 1 via OROMUCOSAL
  Filled 2022-06-24: qty 177

## 2022-06-24 MED ORDER — ONDANSETRON HCL 4 MG/2ML IJ SOLN
INTRAMUSCULAR | Status: DC | PRN
  Administered 2022-06-24: 4 mg via INTRAVENOUS

## 2022-06-24 MED ORDER — SODIUM CHLORIDE 0.9 % IV SOLN: INTRAVENOUS | Status: DC

## 2022-06-24 MED ORDER — DEXAMETHASONE SODIUM PHOSPHATE 10 MG/ML IJ SOLN
INTRAMUSCULAR | Status: DC | PRN
  Administered 2022-06-24: 10 mg via INTRAVENOUS

## 2022-06-24 MED ORDER — GLUCAGON HCL RDNA (DIAGNOSTIC) 1 MG IJ SOLR
INTRAMUSCULAR | Status: AC
  Filled 2022-06-24: qty 1

## 2022-06-24 MED ORDER — SUGAMMADEX SODIUM 200 MG/2ML IV SOLN
INTRAVENOUS | Status: DC | PRN
  Administered 2022-06-24: 150 mg via INTRAVENOUS

## 2022-06-24 MED ORDER — ROCURONIUM BROMIDE 10 MG/ML (PF) SYRINGE
PREFILLED_SYRINGE | INTRAVENOUS | Status: DC | PRN
  Administered 2022-06-24: 50 mg via INTRAVENOUS

## 2022-06-24 MED ORDER — LIDOCAINE 2% (20 MG/ML) 5 ML SYRINGE
INTRAMUSCULAR | Status: DC | PRN
  Administered 2022-06-24: 40 mg via INTRAVENOUS

## 2022-06-24 MED ORDER — MIDAZOLAM HCL (PF) 5 MG/ML IJ SOLN
INTRAMUSCULAR | Status: AC
  Filled 2022-06-24: qty 1

## 2022-06-24 MED ORDER — MIDAZOLAM HCL (PF) 5 MG/ML IJ SOLN
INTRAMUSCULAR | Status: DC | PRN
  Administered 2022-06-24: 2.5 mg via INTRAVENOUS

## 2022-06-24 MED ORDER — PHENYLEPHRINE 80 MCG/ML (10ML) SYRINGE FOR IV PUSH (FOR BLOOD PRESSURE SUPPORT)
PREFILLED_SYRINGE | INTRAVENOUS | Status: DC | PRN
  Administered 2022-06-24: 80 ug via INTRAVENOUS

## 2022-06-24 MED ORDER — ALBUMIN HUMAN 5 % IV SOLN: INTRAVENOUS | Status: DC | PRN

## 2022-06-24 MED ORDER — SODIUM CHLORIDE 0.9 % IV SOLN
INTRAVENOUS | Status: DC | PRN
  Administered 2022-06-24: 50 mL

## 2022-06-24 MED ORDER — DICLOFENAC SUPPOSITORY 100 MG
RECTAL | Status: DC | PRN
  Administered 2022-06-24: 100 mg via RECTAL

## 2022-06-24 MED ORDER — PROPOFOL 10 MG/ML IV BOLUS
INTRAVENOUS | Status: DC | PRN
  Administered 2022-06-24: 150 mg via INTRAVENOUS

## 2022-06-24 MED ORDER — LACTATED RINGERS IV SOLN: INTRAVENOUS | Status: DC

## 2022-06-24 MED ORDER — DICLOFENAC SUPPOSITORY 100 MG
RECTAL | Status: AC
  Filled 2022-06-24: qty 1

## 2022-06-24 NOTE — Op Note (Signed)
[facility] Patient Name: Jamie Hansen Procedure Date : 06/24/2022 MRN: [id number] Attending MD: Vida Rigger , MD, [id number] Date of Birth: Jan 06, 1996 CSN: [id number] Age: 27 Admit Type: Inpatient Procedure:                ERCP Indications:              Abnormal MRCP, Bile duct stone(s) Providers:                Vida Rigger, MD, Janae Sauce. Steele Berg, RN, [employer],                            Technician, Deretha Emory crna Referring MD:              Medicines:                General Anesthesia Complications:            No immediate complications. Estimated Blood Loss:     Estimated blood loss: none. Procedure:                Pre-Anesthesia Assessment:                           - Prior to the procedure, a History and Physical                            was performed, and patient medications and                            allergies were reviewed. The patient's tolerance of                            previous anesthesia was also reviewed. The risks                            and benefits of the procedure and the sedation                            options and risks were discussed with the patient.                            All questions were answered, and informed consent                            was obtained. Prior Anticoagulants: The patient has                            taken no anticoagulant or antiplatelet agents. ASA                            Grade Assessment: II - A patient with mild systemic                            disease. After reviewing the risks and benefits,  the patient was deemed in satisfactory condition to                            undergo the procedure.                           After obtaining informed consent, the scope was                            passed under direct vision. Throughout the                            procedure, the patient's blood pressure, pulse, and                            oxygen  saturations were monitored continuously. The                            [id number]-Q190V ([id number] Olympus duodenoscope was                            introduced through the mouth, and used to inject                            contrast into and used to cannulate the bile duct.                            The ERCP was technically difficult and complex due                            to abnormal anatomy. Successful completion of the                            procedure was aided by performing the maneuvers                            documented (below) in this report. The patient                            tolerated the procedure well. Scope In: Scope Out: Findings:      A biliary sphincterotomy had been performed. The sphincterotomy appeared       open. The major papilla was normal. Deep selective cannulation was       readily obtained and the biliary sphincterotomy was extended with a       Hydratome sphincterotome using ERBE electrocautery. There was no       post-sphincterotomy bleeding. We could get the fully bowed       sphincterotome easily in and out of the duct and there was adequate       biliary drainage in fact we did see some pus draining from the CBD and       to discover objects, the biliary tree was swept first with an adjustable       12- 15 mm balloon starting at the bifurcation and right main hepatic  duct. Sludge was swept from the duct. We could not get the 12 to go       through the patent sphincterotomy site so after a few pull-through       attempts we switched to the smaller balloon and the 9 millimeter balloon       passed readily through the patent sphincterotomy site and then after all       stones were removed the 12 mm balloon also would passed readily at that       time. And nothing was found on subsequent multiple balloon       pull-through's. 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 Bile flowed through the stent. The stent was in good       position. Not mentioned above there was no pancreatic duct injection or       wire advancement throughout the procedure and no obvious residual stones       on multiple occlusion cholangiograms of the CBD as well Impression:               - Prior biliary sphincterotomy appeared open.                           - The major papilla appeared normal.                           - Choledocholithiasis was found. Complete removal                            was accomplished by biliary sphincterotomy and                            balloon extraction.                           - A biliary sphincterotomy was performed.                           - The biliary tree was swept and nothing was found                            to the procedure.                           - One temporary plastic biliary stent was placed  into the common bile duct. Recommendation:           - Clear liquid diet for 6 hours. 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 Would recommend 5                            more days of antibiotics at home                           - Return to GI clinic in 3 weeks.                           - Telephone GI clinic if symptomatic PRN.                           - Check liver enzymes (AST, ALT, alkaline                            phosphatase, bilirubin) in the morning and will                            follow-up as an outpatient. Procedure Code(s):        --- Professional ---                           [phone], Endoscopic retrograde                            cholangiopancreatography (ERCP); with placement of                            endoscopic stent into biliary or pancreatic duct,                            including pre- and post-dilation  and guide wire                            passage, when performed, including sphincterotomy,                            when performed, each stent                           [id number], Endoscopic retrograde                            cholangiopancreatography (ERCP); with removal of                            calculi/debris from biliary/pancreatic duct(s) Diagnosis Code(s):        --- Professional ---                           K80.50, Calculus of bile duct without cholangitis                            or cholecystitis without obstruction                           R93.2, Abnormal findings on diagnostic imaging of                            liver and biliary tract CPT copyright 2022 [employer]. All rights reserved. The codes documented in this report are preliminary and upon coder review may  be revised to meet current compliance requirements. Vida Rigger, MD 06/24/2022 9:09:17 AM This report has been signed electronically. Number of Addenda: 0

## 2022-06-24 NOTE — Progress Notes (Signed)
[facility]                                                                              Jamie Hansen, is a 27 y.o. female, DOB - Feb 07, 1996, UJW:[id number] Admit date - 06/22/2022    Outpatient Primary MD for the patient is Patient, No Pcp Per  LOS - 1  days  Chief Complaint  Patient presents with   Chest Pain       Brief summary   Patient is a 27 year old female with bipolar disorder, anxiety, previous choledocholithiasis status postcholecystectomy in 12/2021 and subsequent post ERCP pancreatitis.  Patient was admitted in 02/2022 for RUQ abdominal pain and found to have hyperbilirubinemia, MRCP showed dilated bile ducts.  Underwent EUS and ERCP which did not show specific blockages. Presented with nausea, vomiting, right upper quadrant abdominal pain.  Patient also reported some shortness of breath with chest discomfort. Noted to have some tachycardia, afebrile. WBC 13.9, sodium 132, AST 281, ALT 416, alk phos 358, T. bili 1.2, lipase normal, UA not suggestive of infection, urine pregnancy test negative, lactic acid normal, troponin normal, D-dimer 0.95.  RUQ US showed possible shadowing stone in CBD, recommended MRCP. GI consulted.  Assessment & Plan    Principal Problem: Transaminitis, intrahepatic biliary dilatation/?  Choledocholithiasis, with cholangitis -History of prior cholecystectomy, presented with nausea, vomiting, RUQ abdominal pain. -Normal lipase, LFTs elevated. -CT showed asymmetric intrahepatic biliary dilation and ultrasound showed possible CBD stone -MRCP showed choledocholithiasis with increased intra and extrahepatic biliary duct dilation and associated cholangitis -Continue IV Zosyn -ERCP today showed choledocholithiasis, complete removal with biliary sphincterotomy and balloon extraction.  1 temporary plastic biliary stent was placed into the common bile duct. -Clear liquid diet, advance to soft solid for supper if tolerated -  CMET in am.  Recommended 5 more days of antibiotics. -LFTs improving today  Active Problems:   Bipolar disorder (HCC),   Anxiety -Not on any meds    Hyponatremia Continue IV fluid hydration, sodium improving  Sickle cell trait: -Per pt, she had history of sickle cell trait - currently no acute issues but would like outpatient referral to hematology  Microcytic anemia, iron deficiency anemia - H/H close to baseline ~11, MCV  74.7 -Iron 10, percent saturation ratio 4, ferritin 123, B12 584, folate pending -Placed on iron supplementation   Estimated body mass index is 24.24 kg/m as calculated from the following:   Height as of this encounter: 4\' 11"  (1.499 m).   Weight as of this encounter: 54.4 kg.  Code Status: Full code DVT Prophylaxis:  SCDs Start: 06/23/22 0541   Level of Care: Level of care: Med-Surg Family Communication: Updated patient's sister at the bedside Disposition Plan:      Remains inpatient appropriate:   Hopefully DC home tomorrow   Procedures:  MRCP ERCP  Consultants:   GI  Antimicrobials:   Anti-infectives (From admission, onward)    Start     Dose/Rate Route Frequency Ordered Stop   06/23/22 0630  piperacillin-tazobactam (ZOSYN) IVPB 3.375 g        3.375 g 12.5 mL/hr over 240 Minutes Intravenous Every 8 hours 06/23/22 0542  06/22/22 2300  piperacillin-tazobactam (ZOSYN) IVPB 3.375 g        3.375 g 100 mL/hr over 30 Minutes Intravenous  Once 06/22/22 2246 06/22/22 2342          Medications     Subjective:   Jamie Hansen was seen and examined today.  Seen after ERCP, still having some RUQ abdominal pain, sister at the bedside.  Tolerating clear liquid diet, no nausea or vomiting.  No fevers   Objective:   Vitals:   06/24/22 0920 06/24/22 0929 06/24/22 1000 06/24/22 1010  BP: 119/63 101/66 (!) 92/59 92/80  Pulse: 72 65 62 63  Resp: 11 12  12   Temp:   (!) 97.5 F (36.4 C) 97.6 F (36.4 C)  TempSrc:   Oral Oral   SpO2: 100% 100% 100% 100%  Weight:      Height:        Intake/Output Summary (Last 24 hours) at 06/24/2022 1310 Last data filed at 06/24/2022 0855 Gross per 24 hour  Intake 1837.46 ml  Output --  Net 1837.46 ml     Wt Readings from Last 3 Encounters:  06/22/22 54.4 kg  02/23/22 58.2 kg  12/31/21 58.1 kg   Physical Exam General: Alert and oriented x 3, NAD Cardiovascular: S1 S2 clear, RRR.  Respiratory: CTAB Gastrointestinal: Soft, mild RUQ TTP , nondistended, NBS Ext: no pedal edema bilaterally Neuro: no new deficits Psych: Normal affect       Data Reviewed:  I have personally reviewed following labs    CBC Lab Results  Component Value Date   WBC 13.3 (H) 06/24/2022   RBC 3.90 06/24/2022   RBC 3.88 06/24/2022   HGB 10.0 (L) 06/24/2022   HCT 30.4 (L) 06/24/2022   MCV 77.9 (L) 06/24/2022   MCH 25.6 (L) 06/24/2022   PLT 269 06/24/2022   MCHC 32.9 06/24/2022   RDW 15.3 06/24/2022   LYMPHSABS 0.8 06/24/2022   MONOABS 0.3 06/24/2022   EOSABS 0.2 06/24/2022   BASOSABS 0.0 06/24/2022     Last metabolic panel Lab Results  Component Value Date   NA 135 06/24/2022   K 3.5 06/24/2022   CL 101 06/24/2022   CO2 25 06/24/2022   BUN <5 (L) 06/24/2022   CREATININE 0.73 06/24/2022   GLUCOSE 138 (H) 06/24/2022   GFRNONAA >60 06/24/2022   GFRAA >60 09/27/2017   CALCIUM 8.1 (L) 06/24/2022   PROT 6.9 06/24/2022   ALBUMIN 2.9 (L) 06/24/2022   BILITOT 1.5 (H) 06/24/2022   ALKPHOS 256 (H) 06/24/2022   AST 52 (H) 06/24/2022   ALT 166 (H) 06/24/2022   ANIONGAP 9 06/24/2022    CBG (last 3)  No results for input(s): "GLUCAP" in the last 72 hours.    Coagulation Profile: No results for input(s): "INR", "PROTIME" in the last 168 hours.   Radiology Studies: I have personally reviewed the imaging studies  DG ERCP  Result Date: 06/24/2022 CLINICAL DATA:  [id number] Elective surgery [id number] EXAM: ERCP COMPARISON:  MRCP, 06/23/2022. ERCP, 02/24/2022. CT AP, 06/22/2022 and  02/22/2022. FLUOROSCOPY: Exposure Index (as provided by the fluoroscopic device): 40.97 mGy Kerma FINDINGS: Limited oblique planar images of the RIGHT upper quadrant obtained C-arm. Images demonstrating flexible endoscopy, biliary duct cannulation, sphincterotomy, retrograde cholangiogram and balloon sweep. No overt biliary ductal dilation or discrete evidence of biliary filling defect. Plastic biliary stent placement. IMPRESSION: Fluoroscopic imaging for ERCP and biliary stent placement. For complete description of intra procedural findings, please see performing service dictation. Electronically Signed  By: Roanna Banning M.D.   On: 06/24/2022 10:03   MR ABDOMEN MRCP W WO CONTAST  Result Date: 06/23/2022 CLINICAL DATA:  History of cholecystectomy complicated by ERCP induced pancreatitis presenting with fever and right upper quadrant abdominal and chest pain EXAM: MRI ABDOMEN WITHOUT AND WITH CONTRAST (INCLUDING MRCP) TECHNIQUE: Multiplanar multisequence MR imaging of the abdomen was performed both before and after the administration of intravenous contrast. Heavily T2-weighted images of the biliary and pancreatic ducts were obtained, and three-dimensional MRCP images were rendered by post processing. CONTRAST:  5mL GADAVIST GADOBUTROL 1 MMOL/ML IV SOLN COMPARISON:  CT abdomen and pelvis dated 06/22/2022, MRCP dated 02/23/2022 FINDINGS: Lower chest: Left lower lobe linear atelectasis. Hepatobiliary: Heterogeneous T2 hyperintense parenchymal signal of the right lobe associated with diffusion restriction and diffuse heterogeneous enhancement of the right hepatic lobe. Increased intrahepatic bile duct dilation measuring up to 11 mm from 3 mm on the right and extrahepatic bile duct dilation measuring 8 mm, previously 5 mm, with multifocal filling defects noted in the downstream right intrahepatic duct at the confluence and within the downstream common bile duct. Associated right intrahepatic biliary enhancement.  There is marked effacement of the intrahepatic bile duct at the confluence (7:85). Cholecystectomy. Pancreas: No mass, inflammatory changes, or other parenchymal abnormality identified. Spleen:  Within normal limits in size and appearance. Adrenals/Urinary Tract: No adrenal nodules. No suspicious renal masses identified. No evidence of hydronephrosis. Stomach/Bowel: Visualized portions within the abdomen are unremarkable. Vascular/Lymphatic: No pathologically enlarged lymph nodes identified. No abdominal aortic aneurysm demonstrated. 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 Trace perisplenic free fluid. Musculoskeletal: No suspicious bone lesions identified. IMPRESSION: Findings of choledocholithiasis with increased intra and extrahepatic bile duct dilation and associated cholangitis. Marked effacement of the intrahepatic bile duct at the confluence, suspicious for underlying stenosis. Electronically Signed   By: Agustin Cree M.D.   On: 06/23/2022 09:57   MR 3D Recon At Scanner  Result Date: 06/23/2022 CLINICAL DATA:  History of cholecystectomy complicated by ERCP induced pancreatitis presenting with fever and right upper quadrant abdominal and chest pain EXAM: MRI ABDOMEN WITHOUT AND WITH CONTRAST (INCLUDING MRCP) TECHNIQUE: Multiplanar multisequence MR imaging of the abdomen was performed both before and after the administration of intravenous contrast. Heavily T2-weighted images of the biliary and pancreatic ducts were obtained, and three-dimensional MRCP images were rendered by post processing. CONTRAST:  5mL GADAVIST GADOBUTROL 1 MMOL/ML IV SOLN COMPARISON:  CT abdomen and pelvis dated 06/22/2022, MRCP dated 02/23/2022 FINDINGS: Lower chest: Left lower lobe linear atelectasis. Hepatobiliary: Heterogeneous T2 hyperintense parenchymal signal of the right lobe associated with diffusion restriction and diffuse heterogeneous enhancement of the right hepatic lobe. Increased intrahepatic bile duct  dilation measuring up to 11 mm from 3 mm on the right and extrahepatic bile duct dilation measuring 8 mm, previously 5 mm, with multifocal filling defects noted in the downstream right intrahepatic duct at the confluence and within the downstream common bile duct. Associated right intrahepatic biliary enhancement. There is marked effacement of the intrahepatic bile duct at the confluence (7:85). Cholecystectomy. Pancreas: No mass, inflammatory changes, or other parenchymal abnormality identified. Spleen:  Within normal limits in size and appearance. Adrenals/Urinary Tract: No adrenal nodules. No suspicious renal masses identified. No evidence of hydronephrosis. Stomach/Bowel: Visualized portions within the abdomen are unremarkable. Vascular/Lymphatic: No pathologically enlarged lymph nodes identified. No abdominal aortic aneurysm demonstrated. 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 Trace perisplenic free fluid. Musculoskeletal: No suspicious bone lesions identified. IMPRESSION: Findings  of choledocholithiasis with increased intra and extrahepatic bile duct dilation and associated cholangitis. Marked effacement of the intrahepatic bile duct at the confluence, suspicious for underlying stenosis. Electronically Signed   By: Agustin Cree M.D.   On: 06/23/2022 09:57   CT Angio Chest PE W and/or Wo Contrast  Result Date: 06/22/2022 CLINICAL DATA:  Positive D-dimer. Low to intermediate probability of PE. Biliary obstruction suspected. EXAM: CT ANGIOGRAPHY CHEST CT ABDOMEN AND PELVIS WITH CONTRAST TECHNIQUE: Multidetector CT imaging of the chest was performed using the standard protocol during bolus administration of intravenous contrast. Multiplanar CT image reconstructions and MIPs were obtained to evaluate the vascular anatomy. Multidetector CT imaging of the abdomen and pelvis was performed using the standard protocol during bolus administration of intravenous contrast. RADIATION DOSE REDUCTION:  This exam was performed according to the departmental dose-optimization program which includes automated exposure control, adjustment of the mA and/or kV according to patient size and/or use of iterative reconstruction technique. CONTRAST:  OMNIPAQUE IOHEXOL 350 MG/ML SOLN COMPARISON:  Chest radiograph 06/22/2022; abdominal ultrasound 06/22/2022; MRI 02/23/2022; CT abdomen and pelvis 02/22/2022 FINDINGS: CTA CHEST FINDINGS Cardiovascular: Satisfactory opacification of the pulmonary arteries to the segmental level. No evidence of pulmonary embolism. Normal heart size. No pericardial effusion. Mediastinum/Nodes: Unremarkable esophagus.  No thoracic adenopathy. Lungs/Pleura: No focal consolidation, pleural effusion, pneumothorax. Patent airways. Musculoskeletal: No acute fracture. Review of the MIP images confirms the above findings. CT ABDOMEN and PELVIS FINDINGS Hepatobiliary: Right-greater-than-left intrahepatic biliary dilation. This may be slightly increased compared to 02/22/2022. Unchanged caliber of the common bile duct measuring 8 mm diameter. No radiopaque stones. The prior pneumobilia has resolved. Unremarkable liver. Cholecystectomy. Pancreas: No acute abnormality. Spleen: Unremarkable. Adrenals/Urinary Tract: Normal adrenal glands. No urinary calculi or hydronephrosis. Unremarkable bladder. Stomach/Bowel: Normal caliber large and small bowel. No bowel wall thickening. Normal appendix. Stomach is within normal limits. Vascular/Lymphatic: No significant vascular findings are present. No enlarged abdominal or pelvic lymph nodes. Reproductive: Similar size of the left adnexal mass containing a 2.1 cm area of macroscopic fat. Right tubal ligation clips. Unremarkable uterus. Other: No free intraperitoneal fluid or air. Musculoskeletal: No acute fracture. Review of the MIP images confirms the above findings. IMPRESSION: 1. No evidence of pulmonary embolism or other acute findings in the chest. 2.  Right-greater-than-left intrahepatic biliary dilation. This may be slightly increased compared to 02/22/2022. Unchanged caliber of the common bile duct measuring 8 mm diameter. No radiopaque stones. The asymmetric intrahepatic biliary dilation remains nonspecific with differential considerations of stricture, occult intraductal calculi, or neoplasm. 3. Similar left adnexal ovarian dermoid. Electronically Signed   By: Minerva Fester M.D.   On: 06/22/2022 22:04   CT ABDOMEN PELVIS W CONTRAST  Result Date: 06/22/2022 CLINICAL DATA:  Positive D-dimer. Low to intermediate probability of PE. Biliary obstruction suspected. EXAM: CT ANGIOGRAPHY CHEST CT ABDOMEN AND PELVIS WITH CONTRAST TECHNIQUE: Multidetector CT imaging of the chest was performed using the standard protocol during bolus administration of intravenous contrast. Multiplanar CT image reconstructions and MIPs were obtained to evaluate the vascular anatomy. Multidetector CT imaging of the abdomen and pelvis was performed using the standard protocol during bolus administration of intravenous contrast. RADIATION DOSE REDUCTION: This exam was performed according to the departmental dose-optimization program which includes automated exposure control, adjustment of the mA and/or kV according to patient size and/or use of iterative reconstruction technique. CONTRAST:  OMNIPAQUE IOHEXOL 350 MG/ML SOLN COMPARISON:  Chest radiograph 06/22/2022; abdominal ultrasound 06/22/2022; MRI 02/23/2022; CT abdomen and pelvis 02/22/2022 FINDINGS:  CTA CHEST FINDINGS Cardiovascular: Satisfactory opacification of the pulmonary arteries to the segmental level. No evidence of pulmonary embolism. Normal heart size. No pericardial effusion. Mediastinum/Nodes: Unremarkable esophagus.  No thoracic adenopathy. Lungs/Pleura: No focal consolidation, pleural effusion, pneumothorax. Patent airways. Musculoskeletal: No acute fracture. Review of the MIP images confirms the above  findings. CT ABDOMEN and PELVIS FINDINGS Hepatobiliary: Right-greater-than-left intrahepatic biliary dilation. This may be slightly increased compared to 02/22/2022. Unchanged caliber of the common bile duct measuring 8 mm diameter. No radiopaque stones. The prior pneumobilia has resolved. Unremarkable liver. Cholecystectomy. Pancreas: No acute abnormality. Spleen: Unremarkable. Adrenals/Urinary Tract: Normal adrenal glands. No urinary calculi or hydronephrosis. Unremarkable bladder. Stomach/Bowel: Normal caliber large and small bowel. No bowel wall thickening. Normal appendix. Stomach is within normal limits. Vascular/Lymphatic: No significant vascular findings are present. No enlarged abdominal or pelvic lymph nodes. Reproductive: Similar size of the left adnexal mass containing a 2.1 cm area of macroscopic fat. Right tubal ligation clips. Unremarkable uterus. Other: No free intraperitoneal fluid or air. Musculoskeletal: No acute fracture. Review of the MIP images confirms the above findings. IMPRESSION: 1. No evidence of pulmonary embolism or other acute findings in the chest. 2. Right-greater-than-left intrahepatic biliary dilation. This may be slightly increased compared to 02/22/2022. Unchanged caliber of the common bile duct measuring 8 mm diameter. No radiopaque stones. The asymmetric intrahepatic biliary dilation remains nonspecific with differential considerations of stricture, occult intraductal calculi, or neoplasm. 3. Similar left adnexal ovarian dermoid. Electronically Signed   By: Minerva Fester M.D.   On: 06/22/2022 22:04   US Abdomen Limited RUQ (LIVER/GB)  Result Date: 06/22/2022 CLINICAL DATA:  [id number] RUQ abdominal pain [id number] EXAM: ULTRASOUND ABDOMEN LIMITED COMPARISON:  12/31/2021 FINDINGS: The liver demonstrates normal parenchymal echogenicity and homogeneous texture without focal hepatic parenchymal lesions or intrahepatic ductal dilatation. Status post cholecystectomy. Possible stone in  the proximal CBD which could be assessed further with MRCP. Common bile duct not visualized. Imaged portions of the pancreas were unremarkable. IMPRESSION: Possible shadowing stone in the CBD which was otherwise not well visualized. Status post cholecystectomy. Consider correlation with MRCP. Electronically Signed   By: Layla Maw M.D.   On: 06/22/2022 21:14   DG Chest Portable 1 View  Result Date: 06/22/2022 CLINICAL DATA:  r lower chest pain EXAM: PORTABLE CHEST - 1 VIEW COMPARISON:  02/22/2022 FINDINGS: Cardiac silhouette is unremarkable. No pneumothorax or pleural effusion. The lungs are clear. The visualized skeletal structures are unremarkable. IMPRESSION: No acute cardiopulmonary process. Electronically Signed   By: Layla Maw M.D.   On: 06/22/2022 20:36       Ruchy Wildrick M.D. [facility] 06/24/2022, 1:10 PM  Available via Epic secure chat 7am-7pm After 7 pm, please refer to night coverage provider listed on amion.

## 2022-06-24 NOTE — Transfer of Care (Signed)
Immediate Anesthesia Transfer of Care Note  Patient: Jamie Hansen  Procedure(s) Performed: ENDOSCOPIC RETROGRADE CHOLANGIOPANCREATOGRAPHY (ERCP) SPHINCTEROTOMY REMOVAL OF STONES BILIARY STENT PLACEMENT  Patient Location: PACU  Anesthesia Type:General  Level of Consciousness: drowsy  Airway & Oxygen Therapy: Patient Spontanous Breathing and Patient connected to nasal cannula oxygen  Post-op Assessment: Report given to RN and Post -op Vital signs reviewed and stable  Post vital signs: Reviewed and stable  Last Vitals:  Vitals Value Taken Time  BP 102/87   Temp    Pulse 87   Resp 14   SpO2 100     Last Pain:  Vitals:   06/24/22 0727  TempSrc:   PainSc: 4       Patients Stated Pain Goal: 0 (06/23/22 0624)  Complications: No notable events documented.

## 2022-06-24 NOTE — Progress Notes (Signed)
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

## 2022-06-24 NOTE — Anesthesia Procedure Notes (Signed)
Procedure Name: Intubation Date/Time: 06/24/2022 7:48 AM  Performed by: Zollie Beckers, CRNAPre-anesthesia Checklist: Patient identified, Emergency Drugs available, Suction available and Patient being monitored Patient Re-evaluated:Patient Re-evaluated prior to induction Oxygen Delivery Method: Circle system utilized Preoxygenation: Pre-oxygenation with 100% oxygen Induction Type: IV induction Ventilation: Mask ventilation without difficulty Laryngoscope Size: Mac and 3 Grade View: Grade I Tube type: Oral Tube size: 7.0 mm Number of attempts: 1 Airway Equipment and Method: Stylet Placement Confirmation: ETT inserted through vocal cords under direct vision, positive ETCO2 and breath sounds checked- equal and bilateral Secured at: 22 cm Tube secured with: Tape Dental Injury: Teeth and Oropharynx as per pre-operative assessment

## 2022-06-25 LAB — CBC WITH DIFFERENTIAL/PLATELET
Abs Immature Granulocytes: 0.04 10*3/uL (ref 0.00–0.07)
Basophils Absolute: 0 10*3/uL (ref 0.0–0.1)
Basophils Relative: 0 %
Eosinophils Absolute: 0 10*3/uL (ref 0.0–0.5)
Eosinophils Relative: 0 %
HCT: 32.1 % — ABNORMAL LOW (ref 36.0–46.0)
Hemoglobin: 10.6 g/dL — ABNORMAL LOW (ref 12.0–15.0)
Immature Granulocytes: 0 %
Lymphocytes Relative: 14 %
Lymphs Abs: 1.8 10*3/uL (ref 0.7–4.0)
MCH: 25.7 pg — ABNORMAL LOW (ref 26.0–34.0)
MCHC: 33 g/dL (ref 30.0–36.0)
MCV: 77.9 fL — ABNORMAL LOW (ref 80.0–100.0)
Monocytes Absolute: 0.6 10*3/uL (ref 0.1–1.0)
Monocytes Relative: 4 %
Neutro Abs: 10.4 10*3/uL — ABNORMAL HIGH (ref 1.7–7.7)
Neutrophils Relative %: 82 %
Platelets: 298 10*3/uL (ref 150–400)
RBC: 4.12 MIL/uL (ref 3.87–5.11)
RDW: 15.2 % (ref 11.5–15.5)
WBC: 12.8 10*3/uL — ABNORMAL HIGH (ref 4.0–10.5)
nRBC: 0 % (ref 0.0–0.2)

## 2022-06-25 LAB — COMPREHENSIVE METABOLIC PANEL
ALT: 165 U/L — ABNORMAL HIGH (ref 0–44)
AST: 68 U/L — ABNORMAL HIGH (ref 15–41)
Albumin: 3 g/dL — ABNORMAL LOW (ref 3.5–5.0)
Alkaline Phosphatase: 283 U/L — ABNORMAL HIGH (ref 38–126)
Anion gap: 9 (ref 5–15)
BUN: <5 mg/dL — ABNORMAL LOW (ref 6–20)
CO2: 24 mmol/L (ref 22–32)
Calcium: 8.6 mg/dL — ABNORMAL LOW (ref 8.9–10.3)
Chloride: 104 mmol/L (ref 98–111)
Creatinine, Ser: 0.75 mg/dL (ref 0.44–1.00)
GFR, Estimated: >60 mL/min (ref >60)
Glucose, Bld: 100 mg/dL — ABNORMAL HIGH (ref 70–99)
Potassium: 3.4 mmol/L — ABNORMAL LOW (ref 3.5–5.1)
Sodium: 137 mmol/L (ref 135–145)
Total Bilirubin: 0.8 mg/dL (ref 0.3–1.2)
Total Protein: 7.3 g/dL (ref 6.5–8.1)

## 2022-06-25 LAB — CULTURE, BLOOD (ROUTINE X 2): Special Requests: ADEQUATE

## 2022-06-25 MED ORDER — ONDANSETRON HCL 4 MG PO TABS: 4.0000 mg | ORAL_TABLET | Freq: Every day | ORAL | 1 refills | Status: AC | PRN

## 2022-06-25 MED ORDER — OXYCODONE HCL 5 MG PO TABS: 5.0000 mg | ORAL_TABLET | Freq: Four times a day (QID) | ORAL | 0 refills | Status: AC | PRN

## 2022-06-25 MED ORDER — BENZONATATE 100 MG PO CAPS
100.0000 mg | ORAL_CAPSULE | Freq: Three times a day (TID) | ORAL | Status: DC
  Administered 2022-06-25: 100 mg via ORAL
  Filled 2022-06-25: qty 1

## 2022-06-25 MED ORDER — HYDROMORPHONE HCL 1 MG/ML IJ SOLN: 0.5000 mg | Freq: Three times a day (TID) | INTRAMUSCULAR | Status: DC | PRN

## 2022-06-25 MED ORDER — HYDROMORPHONE HCL 1 MG/ML IJ SOLN
0.5000 mg | INTRAMUSCULAR | Status: DC | PRN
  Administered 2022-06-25: 0.5 mg via INTRAVENOUS
  Filled 2022-06-25: qty 0.5

## 2022-06-25 MED ORDER — POLYSACCHARIDE IRON COMPLEX 150 MG PO CAPS: 150.0000 mg | ORAL_CAPSULE | Freq: Every day | ORAL | 1 refills | Status: AC

## 2022-06-25 MED ORDER — POTASSIUM CHLORIDE CRYS ER 20 MEQ PO TBCR: 40.0000 meq | EXTENDED_RELEASE_TABLET | Freq: Once | ORAL | Status: DC

## 2022-06-25 MED ORDER — MENTHOL 3 MG MT LOZG
1.0000 | LOZENGE | OROMUCOSAL | Status: DC | PRN
  Administered 2022-06-25: 3 mg via ORAL
  Filled 2022-06-25: qty 9

## 2022-06-25 MED ORDER — AMOXICILLIN-POT CLAVULANATE 875-125 MG PO TABS: 1.0000 | ORAL_TABLET | Freq: Two times a day (BID) | ORAL | 0 refills | Status: AC

## 2022-06-25 NOTE — Plan of Care (Signed)

## 2022-06-25 NOTE — TOC Transition Note (Signed)
Transition of Care [facility]) - CM/SW Discharge Note   Patient Details  Name: Jamie Hansen MRN: [id number] Date of Birth: May 29, 1995  Transition of Care [facility]) CM/SW Contact:  Janae Bridgeman, RN Phone Number: 06/25/2022, 11:17 AM   Clinical Narrative:    CM met with the patient at the bedside to discuss her discharge to return home.  The patient states that she does not want to go home until her infection and abdominal pain are gone.  I spoke with the patient at length that she was medically stable to discharge and that she would be provided with appropriate prescriptions post surgery and follow up.  The patient was angry and states that she does not want to go home since she is not out of pain and has children to care for at home.  Bedside nursing is aware and will follow up with the patient at the bedside with discharge instructions.  The patient states "Just have the nurse bring me all the paperwork".  I asked that the patient coordinate safe transportation home with family.  Bedside nursing will follow up at the bedside for discharge instructions for home today.  The patient states that she has a primary care - [facility] clinic was also provided since the patient was refusing to discuss further resources for discharge.   Final next level of care: Home/Self Care Barriers to Discharge: No Barriers Identified   Patient Goals and CMS Choice CMS Medicare.gov Compare Post Acute Care list provided to:: Patient Choice offered to / list presented to : Patient  Discharge Placement                         Discharge Plan and Services Additional resources added to the After Visit Summary for     Discharge Planning Services: CM Consult                                 Social Determinants of Health (SDOH) Interventions SDOH Screenings   Food Insecurity: No Food Insecurity (06/23/2022)  Housing: Low Risk  (06/23/2022)  Transportation Needs: No Transportation Needs  (06/23/2022)  Utilities: Not At Risk (06/23/2022)  Tobacco Use: Medium Risk (06/24/2022)     Readmission Risk Interventions    06/25/2022   11:17 AM 01/05/2022    8:53 AM  Readmission Risk Prevention Plan  Post Dischage Appt Complete Complete  Medication Screening Complete Complete  Transportation Screening Complete Complete

## 2022-06-25 NOTE — Discharge Summary (Signed)
Physician Discharge Summary   Patient: Jamie Hansen MRN: [id number] DOB: February 09, 1996  Admit date:     06/22/2022  Discharge date: 06/25/22  Discharge Physician: Thad Ranger, MD    PCP: Patient, No Pcp Per   Recommendations at discharge:   Continue Augmentin 875-125mg  1 tab BID x 5 days  Outpatient follow-up with [facility]   Discharge Diagnoses:    Choledocholithiasis with obstruction   Anxiety   Elevated liver enzymes   Cholangitis   Hyponatremia  Hospital Course: Patient is a 27 year old female with bipolar disorder, anxiety, previous choledocholithiasis status postcholecystectomy in 12/2021 and subsequent post ERCP pancreatitis.  Patient was admitted in 02/2022 for RUQ abdominal pain and found to have hyperbilirubinemia, MRCP showed dilated bile ducts.  Underwent EUS and ERCP which did not show specific blockages. Presented with nausea, vomiting, right upper quadrant abdominal pain.  Patient also reported some shortness of breath with chest discomfort. Noted to have some tachycardia, afebrile. WBC 13.9, sodium 132, AST 281, ALT 416, alk phos 358, T. bili 1.2, lipase normal, UA not suggestive of infection, urine pregnancy test negative, lactic acid normal, troponin normal, D-dimer 0.95.  RUQ US showed possible shadowing stone in CBD, recommended MRCP. GI consulted.   Assessment and Plan:  Transaminitis, intrahepatic biliary dilatation/?  Choledocholithiasis, with cholangitis -History of prior cholecystectomy, presented with nausea, vomiting, RUQ abdominal pain. -Normal lipase, LFTs elevated. -CT showed asymmetric intrahepatic biliary dilation and ultrasound showed possible CBD stone -MRCP showed choledocholithiasis with increased intra and extrahepatic biliary duct dilation and associated cholangitis -Patient was placed on IV Zosyn -ERCP 06/24/2022 showed choledocholithiasis, complete removal with biliary sphincterotomy and balloon extraction.  1 temporary plastic  biliary stent was placed into the common bile duct. -Currently tolerating soft solids without any difficulty.   -Leukocytosis, LFTs improving, continue Augmentin 875-125 mg p.o. twice daily for 5 more days, and outpatient follow-up with GI.      Bipolar disorder (HCC),   Anxiety -Not on any meds.       Hyponatremia -Presented with sodium of 132, started on IV fluids. -Currently tolerating diet without any difficulty, sodium 137 at discharge  Hypokalemia -Replaced    Sickle cell trait: -Per pt, she had history of sickle cell trait - currently no acute issues   Microcytic anemia, iron deficiency anemia - H/H close to baseline ~11, MCV  74.7 -Iron 10, percent saturation ratio 4, ferritin 123, B12 584, folate pending -Placed on iron supplementation     Estimated body mass index is 24.24 kg/m as calculated from the following:   Height as of this encounter: 4\' 11"  (1.499 m).   Weight as of this encounter: 54.4 kg.        Pain control - [employer] Controlled Substance Reporting System database was reviewed. and patient was instructed, not to drive, operate heavy machinery, perform activities at heights, swimming or participation in water activities or provide baby-sitting services while on Pain, Sleep and Anxiety Medications; until their outpatient Physician has advised to do so again. Also recommended to not to take more than prescribed Pain, Sleep and Anxiety Medications.  Consultants: Gastroenterology Procedures performed: MRCP, ERCP Disposition: Home Diet recommendation:  Discharge Diet Orders (From admission, onward)     Start     Ordered   06/25/22 0000  Diet - low sodium heart healthy        06/25/22 1204            DISCHARGE MEDICATION: Allergies as of 06/25/2022  Reactions   Latex Itching, Other (See Comments)   Itching and yeast like symptoms with latex condom use.        Medication List     TAKE these medications    amoxicillin-clavulanate  875-125 MG tablet Commonly known as: AUGMENTIN Take 1 tablet by mouth 2 (two) times daily for 5 days.   iron polysaccharides 150 MG capsule Commonly known as: NIFEREX Take 1 capsule (150 mg total) by mouth daily. Start taking on: Jun 26, 2022   ondansetron 4 MG tablet Commonly known as: Zofran Take 1 tablet (4 mg total) by mouth daily as needed for nausea or vomiting.   oxyCODONE 5 MG immediate release tablet Commonly known as: Oxy IR/ROXICODONE Take 1 tablet (5 mg total) by mouth every 6 (six) hours as needed for up to 5 days for severe pain.        Follow-up Information     [facility]. Schedule an appointment as soon as possible for a visit.   Why: Please call the clinic and schedule to have a primary care physician. Contact information: [address] [place] [address] [phone]        Vida Rigger, MD. Schedule an appointment as soon as possible for a visit in 2 week(s).   Specialty: Gastroenterology Why: for hospital follow-up Contact information: [address] [place] [address] [phone]                Discharge Exam: Ceasar Mons Weights   06/22/22 1530  Weight: 54.4 kg   S: No acute complaints this morning, tolerating diet, no fevers or chills.   BP 112/79 (BP Location: Right Arm)   Pulse 61   Temp 98.3 F (36.8 C)   Resp 18   Ht 4\' 11"  (1.499 m)   Wt 54.4 kg   LMP 05/03/2022   SpO2 100%   BMI 24.24 kg/m   Physical Exam General: Alert and oriented x 3, NAD Cardiovascular: S1 S2 clear, RRR.  Respiratory: CTAB Gastrointestinal: Soft, nontender, nondistended, NBS Ext: no pedal edema bilaterally Neuro: no new deficits  Condition at discharge: fair  The results of significant diagnostics from this hospitalization (including imaging, microbiology, ancillary and laboratory) are listed below for reference.   Imaging Studies: DG ERCP  Result Date:  06/24/2022 CLINICAL DATA:  [id number] Elective surgery [id number] EXAM: ERCP COMPARISON:  MRCP, 06/23/2022. ERCP, 02/24/2022. CT AP, 06/22/2022 and 02/22/2022. FLUOROSCOPY: Exposure Index (as provided by the fluoroscopic device): 40.97 mGy Kerma FINDINGS: Limited oblique planar images of the RIGHT upper quadrant obtained C-arm. Images demonstrating flexible endoscopy, biliary duct cannulation, sphincterotomy, retrograde cholangiogram and balloon sweep. No overt biliary ductal dilation or discrete evidence of biliary filling defect. Plastic biliary stent placement. IMPRESSION: Fluoroscopic imaging for ERCP and biliary stent placement. For complete description of intra procedural findings, please see performing service dictation. Electronically Signed   By: Roanna Banning M.D.   On: 06/24/2022 10:03   MR ABDOMEN MRCP W WO CONTAST  Result Date: 06/23/2022 CLINICAL DATA:  History of cholecystectomy complicated by ERCP induced pancreatitis presenting with fever and right upper quadrant abdominal and chest pain EXAM: MRI ABDOMEN WITHOUT AND WITH CONTRAST (INCLUDING MRCP) TECHNIQUE: Multiplanar multisequence MR imaging of the abdomen was performed both before and after the administration of intravenous contrast. Heavily T2-weighted images of the biliary and pancreatic ducts were obtained, and three-dimensional MRCP images were rendered by post processing. CONTRAST:  5mL GADAVIST GADOBUTROL 1 MMOL/ML  IV SOLN COMPARISON:  CT abdomen and pelvis dated 06/22/2022, MRCP dated 02/23/2022 FINDINGS: Lower chest: Left lower lobe linear atelectasis. Hepatobiliary: Heterogeneous T2 hyperintense parenchymal signal of the right lobe associated with diffusion restriction and diffuse heterogeneous enhancement of the right hepatic lobe. Increased intrahepatic bile duct dilation measuring up to 11 mm from 3 mm on the right and extrahepatic bile duct dilation measuring 8 mm, previously 5 mm, with multifocal filling defects noted in the downstream  right intrahepatic duct at the confluence and within the downstream common bile duct. Associated right intrahepatic biliary enhancement. There is marked effacement of the intrahepatic bile duct at the confluence (7:85). Cholecystectomy. Pancreas: No mass, inflammatory changes, or other parenchymal abnormality identified. Spleen:  Within normal limits in size and appearance. Adrenals/Urinary Tract: No adrenal nodules. No suspicious renal masses identified. No evidence of hydronephrosis. Stomach/Bowel: Visualized portions within the abdomen are unremarkable. Vascular/Lymphatic: No pathologically enlarged lymph nodes identified. No abdominal aortic aneurysm demonstrated. 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 Trace perisplenic free fluid. Musculoskeletal: No suspicious bone lesions identified. IMPRESSION: Findings of choledocholithiasis with increased intra and extrahepatic bile duct dilation and associated cholangitis. Marked effacement of the intrahepatic bile duct at the confluence, suspicious for underlying stenosis. Electronically Signed   By: Agustin Cree M.D.   On: 06/23/2022 09:57   MR 3D Recon At Scanner  Result Date: 06/23/2022 CLINICAL DATA:  History of cholecystectomy complicated by ERCP induced pancreatitis presenting with fever and right upper quadrant abdominal and chest pain EXAM: MRI ABDOMEN WITHOUT AND WITH CONTRAST (INCLUDING MRCP) TECHNIQUE: Multiplanar multisequence MR imaging of the abdomen was performed both before and after the administration of intravenous contrast. Heavily T2-weighted images of the biliary and pancreatic ducts were obtained, and three-dimensional MRCP images were rendered by post processing. CONTRAST:  5mL GADAVIST GADOBUTROL 1 MMOL/ML IV SOLN COMPARISON:  CT abdomen and pelvis dated 06/22/2022, MRCP dated 02/23/2022 FINDINGS: Lower chest: Left lower lobe linear atelectasis. Hepatobiliary: Heterogeneous T2 hyperintense parenchymal signal of the right lobe  associated with diffusion restriction and diffuse heterogeneous enhancement of the right hepatic lobe. Increased intrahepatic bile duct dilation measuring up to 11 mm from 3 mm on the right and extrahepatic bile duct dilation measuring 8 mm, previously 5 mm, with multifocal filling defects noted in the downstream right intrahepatic duct at the confluence and within the downstream common bile duct. Associated right intrahepatic biliary enhancement. There is marked effacement of the intrahepatic bile duct at the confluence (7:85). Cholecystectomy. Pancreas: No mass, inflammatory changes, or other parenchymal abnormality identified. Spleen:  Within normal limits in size and appearance. Adrenals/Urinary Tract: No adrenal nodules. No suspicious renal masses identified. No evidence of hydronephrosis. Stomach/Bowel: Visualized portions within the abdomen are unremarkable. Vascular/Lymphatic: No pathologically enlarged lymph nodes identified. No abdominal aortic aneurysm demonstrated. 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 Trace perisplenic free fluid. Musculoskeletal: No suspicious bone lesions identified. IMPRESSION: Findings of choledocholithiasis with increased intra and extrahepatic bile duct dilation and associated cholangitis. Marked effacement of the intrahepatic bile duct at the confluence, suspicious for underlying stenosis. Electronically Signed   By: Agustin Cree M.D.   On: 06/23/2022 09:57   CT Angio Chest PE W and/or Wo Contrast  Result Date: 06/22/2022 CLINICAL DATA:  Positive D-dimer. Low to intermediate probability of PE. Biliary obstruction suspected. EXAM: CT ANGIOGRAPHY CHEST CT ABDOMEN AND PELVIS WITH CONTRAST TECHNIQUE: Multidetector CT imaging of the chest was performed using the standard protocol during bolus administration of intravenous contrast. Multiplanar  CT image reconstructions and MIPs were obtained to evaluate the vascular anatomy. Multidetector CT imaging of the  abdomen and pelvis was performed using the standard protocol during bolus administration of intravenous contrast. RADIATION DOSE REDUCTION: This exam was performed according to the departmental dose-optimization program which includes automated exposure control, adjustment of the mA and/or kV according to patient size and/or use of iterative reconstruction technique. CONTRAST:  OMNIPAQUE IOHEXOL 350 MG/ML SOLN COMPARISON:  Chest radiograph 06/22/2022; abdominal ultrasound 06/22/2022; MRI 02/23/2022; CT abdomen and pelvis 02/22/2022 FINDINGS: CTA CHEST FINDINGS Cardiovascular: Satisfactory opacification of the pulmonary arteries to the segmental level. No evidence of pulmonary embolism. Normal heart size. No pericardial effusion. Mediastinum/Nodes: Unremarkable esophagus.  No thoracic adenopathy. Lungs/Pleura: No focal consolidation, pleural effusion, pneumothorax. Patent airways. Musculoskeletal: No acute fracture. Review of the MIP images confirms the above findings. CT ABDOMEN and PELVIS FINDINGS Hepatobiliary: Right-greater-than-left intrahepatic biliary dilation. This may be slightly increased compared to 02/22/2022. Unchanged caliber of the common bile duct measuring 8 mm diameter. No radiopaque stones. The prior pneumobilia has resolved. Unremarkable liver. Cholecystectomy. Pancreas: No acute abnormality. Spleen: Unremarkable. Adrenals/Urinary Tract: Normal adrenal glands. No urinary calculi or hydronephrosis. Unremarkable bladder. Stomach/Bowel: Normal caliber large and small bowel. No bowel wall thickening. Normal appendix. Stomach is within normal limits. Vascular/Lymphatic: No significant vascular findings are present. No enlarged abdominal or pelvic lymph nodes. Reproductive: Similar size of the left adnexal mass containing a 2.1 cm area of macroscopic fat. Right tubal ligation clips. Unremarkable uterus. Other: No free intraperitoneal fluid or air. Musculoskeletal: No acute fracture. Review of the  MIP images confirms the above findings. IMPRESSION: 1. No evidence of pulmonary embolism or other acute findings in the chest. 2. Right-greater-than-left intrahepatic biliary dilation. This may be slightly increased compared to 02/22/2022. Unchanged caliber of the common bile duct measuring 8 mm diameter. No radiopaque stones. The asymmetric intrahepatic biliary dilation remains nonspecific with differential considerations of stricture, occult intraductal calculi, or neoplasm. 3. Similar left adnexal ovarian dermoid. Electronically Signed   By: Minerva Fester M.D.   On: 06/22/2022 22:04   CT ABDOMEN PELVIS W CONTRAST  Result Date: 06/22/2022 CLINICAL DATA:  Positive D-dimer. Low to intermediate probability of PE. Biliary obstruction suspected. EXAM: CT ANGIOGRAPHY CHEST CT ABDOMEN AND PELVIS WITH CONTRAST TECHNIQUE: Multidetector CT imaging of the chest was performed using the standard protocol during bolus administration of intravenous contrast. Multiplanar CT image reconstructions and MIPs were obtained to evaluate the vascular anatomy. Multidetector CT imaging of the abdomen and pelvis was performed using the standard protocol during bolus administration of intravenous contrast. RADIATION DOSE REDUCTION: This exam was performed according to the departmental dose-optimization program which includes automated exposure control, adjustment of the mA and/or kV according to patient size and/or use of iterative reconstruction technique. CONTRAST:  OMNIPAQUE IOHEXOL 350 MG/ML SOLN COMPARISON:  Chest radiograph 06/22/2022; abdominal ultrasound 06/22/2022; MRI 02/23/2022; CT abdomen and pelvis 02/22/2022 FINDINGS: CTA CHEST FINDINGS Cardiovascular: Satisfactory opacification of the pulmonary arteries to the segmental level. No evidence of pulmonary embolism. Normal heart size. No pericardial effusion. Mediastinum/Nodes: Unremarkable esophagus.  No thoracic adenopathy. Lungs/Pleura: No focal consolidation,  pleural effusion, pneumothorax. Patent airways. Musculoskeletal: No acute fracture. Review of the MIP images confirms the above findings. CT ABDOMEN and PELVIS FINDINGS Hepatobiliary: Right-greater-than-left intrahepatic biliary dilation. This may be slightly increased compared to 02/22/2022. Unchanged caliber of the common bile duct measuring 8 mm diameter. No radiopaque stones. The prior pneumobilia has resolved. Unremarkable liver. Cholecystectomy. Pancreas: No acute abnormality.  Spleen: Unremarkable. Adrenals/Urinary Tract: Normal adrenal glands. No urinary calculi or hydronephrosis. Unremarkable bladder. Stomach/Bowel: Normal caliber large and small bowel. No bowel wall thickening. Normal appendix. Stomach is within normal limits. Vascular/Lymphatic: No significant vascular findings are present. No enlarged abdominal or pelvic lymph nodes. Reproductive: Similar size of the left adnexal mass containing a 2.1 cm area of macroscopic fat. Right tubal ligation clips. Unremarkable uterus. Other: No free intraperitoneal fluid or air. Musculoskeletal: No acute fracture. Review of the MIP images confirms the above findings. IMPRESSION: 1. No evidence of pulmonary embolism or other acute findings in the chest. 2. Right-greater-than-left intrahepatic biliary dilation. This may be slightly increased compared to 02/22/2022. Unchanged caliber of the common bile duct measuring 8 mm diameter. No radiopaque stones. The asymmetric intrahepatic biliary dilation remains nonspecific with differential considerations of stricture, occult intraductal calculi, or neoplasm. 3. Similar left adnexal ovarian dermoid. Electronically Signed   By: Minerva Fester M.D.   On: 06/22/2022 22:04   US Abdomen Limited RUQ (LIVER/GB)  Result Date: 06/22/2022 CLINICAL DATA:  [id number] RUQ abdominal pain [id number] EXAM: ULTRASOUND ABDOMEN LIMITED COMPARISON:  12/31/2021 FINDINGS: The liver demonstrates normal parenchymal echogenicity and homogeneous  texture without focal hepatic parenchymal lesions or intrahepatic ductal dilatation. Status post cholecystectomy. Possible stone in the proximal CBD which could be assessed further with MRCP. Common bile duct not visualized. Imaged portions of the pancreas were unremarkable. IMPRESSION: Possible shadowing stone in the CBD which was otherwise not well visualized. Status post cholecystectomy. Consider correlation with MRCP. Electronically Signed   By: Layla Maw M.D.   On: 06/22/2022 21:14   DG Chest Portable 1 View  Result Date: 06/22/2022 CLINICAL DATA:  r lower chest pain EXAM: PORTABLE CHEST - 1 VIEW COMPARISON:  02/22/2022 FINDINGS: Cardiac silhouette is unremarkable. No pneumothorax or pleural effusion. The lungs are clear. The visualized skeletal structures are unremarkable. IMPRESSION: No acute cardiopulmonary process. Electronically Signed   By: Layla Maw M.D.   On: 06/22/2022 20:36    Microbiology: Results for orders placed or performed during the hospital encounter of 06/22/22  Culture, blood (Routine X 2) w Reflex to ID Panel     Status: None (Preliminary result)   Collection Time: 06/23/22  3:45 PM   Specimen: BLOOD LEFT HAND  Result Value Ref Range Status   Specimen Description BLOOD LEFT HAND  Final   Special Requests   Final    BOTTLES DRAWN AEROBIC AND ANAEROBIC Blood Culture adequate volume   Culture   Final    NO GROWTH 2 DAYS Performed at [facility], [address], [place], [place] [address]    Report Status PENDING  Incomplete  Culture, blood (Routine X 2) w Reflex to ID Panel     Status: None (Preliminary result)   Collection Time: 06/23/22  3:47 PM   Specimen: BLOOD LEFT HAND  Result Value Ref Range Status   Specimen Description BLOOD LEFT HAND  Final   Special Requests   Final    BOTTLES DRAWN AEROBIC AND ANAEROBIC Blood Culture adequate volume   Culture   Final    NO GROWTH 2 DAYS Performed at [facility], [address],  [place], [place] [address]    Report Status PENDING  Incomplete    Labs: CBC: Recent Labs  Lab 06/22/22 1533 06/23/22 0559 06/24/22 1035 06/25/22 0812  WBC 13.9* 13.1* 13.3* 12.8*  NEUTROABS  --   --  12.0* 10.4*  HGB 12.6 11.3* 10.0* 10.6*  HCT  36.9 32.5* 30.4* 32.1*  MCV 75.5* 74.7* 77.9* 77.9*  PLT 343 319 269 298   Basic Metabolic Panel: Recent Labs  Lab 06/22/22 1533 06/23/22 0559 06/24/22 1035 06/25/22 0812  NA 132* 133* 135 137  K 3.8 3.4* 3.5 3.4*  CL 98 100 101 104  CO2 23 23 25 24   GLUCOSE 93 112* 138* 100*  BUN 9 5* <5* <5*  CREATININE 0.77 0.76 0.73 0.75  CALCIUM 8.7* 8.2* 8.1* 8.6*   Liver Function Tests: Recent Labs  Lab 06/22/22 1533 06/23/22 0559 06/24/22 1035 06/25/22 0812  AST 281* 122* 52* 68*  ALT 416* 295* 166* 165*  ALKPHOS 358* 301* 256* 283*  BILITOT 1.2 1.7* 1.5* 0.8  PROT 8.4* 7.0 6.9 7.3  ALBUMIN 3.6 2.9* 2.9* 3.0*   CBG: No results for input(s): "GLUCAP" in the last 168 hours.  Discharge time spent: greater than 30 minutes.  Signed: Thad Ranger, MD [facility] 06/25/2022

## 2022-06-25 NOTE — Progress Notes (Signed)
Discharge instruction given to patient in english and spanish. Explained the need to take all of the antibiotics prescribed, to make follow up appointment with PCP and Gastroenterology. Instructed to pick up medication at the designated pharmacy [facility]). Pt voiced understanding

## 2022-06-25 NOTE — Anesthesia Postprocedure Evaluation (Signed)
Anesthesia Post Note  Patient: Jakiyah Rosser  Procedure(s) Performed: ENDOSCOPIC RETROGRADE CHOLANGIOPANCREATOGRAPHY (ERCP) SPHINCTEROTOMY REMOVAL OF STONES BILIARY STENT PLACEMENT     Patient location during evaluation: PACU Anesthesia Type: General Level of consciousness: awake and alert Pain management: pain level controlled Vital Signs Assessment: post-procedure vital signs reviewed and stable Respiratory status: spontaneous breathing, nonlabored ventilation, respiratory function stable and patient connected to nasal cannula oxygen Cardiovascular status: blood pressure returned to baseline and stable Postop Assessment: no apparent nausea or vomiting Anesthetic complications: no  No notable events documented.  Last Vitals:  Vitals:   06/25/22 0550 06/25/22 0735  BP: 118/68 112/79  Pulse: (!) 59 61  Resp: 16 18  Temp: 36.7 C 36.8 C  SpO2: 100% 100%    Last Pain:  Vitals:   06/25/22 0951  TempSrc:   PainSc: 8                  Shekia Kuper L Namita Yearwood

## 2022-06-25 NOTE — Progress Notes (Signed)
Patient came out of room independently to nurses station. Patient visibly upset and stated she did not understand why she was being sent home when she still had an infection.   Patient began to raise her voice and stated that she wanted to leave immediately and was not waiting for anything. Patient was directed back to room by charge RN. Patient stated that she wanted everyone to hear what was happening to her and continued to raise her voice and speak negatively towards hospital and doctors. Patient began to start hyperventilating and coughing to the point of gagging. Patient informed that she needed to sit down and try to calm her breathing. Nurse manager arrived to room after overhearing her being belligerent and yelling in the hallway. Patient informed that she needed to calm down before we would continue to speak with her.  Rai, MD paged by primary nurse and asked to come speak with patient. Patient refused to wait to speak with doctor and left with DC paperwork. See Laverda Sorenson, RN progress note with discharge education details.

## 2022-06-25 NOTE — Progress Notes (Signed)
PT Cancellation Note  Patient Details Name: Jamie Hansen MRN: [id number] DOB: October 31, 1995   Cancelled Treatment:    Reason Eval/Treat Not Completed: PT screened, no needs identified, will sign off RN and unit nursing Production designer, theatre/television/film present and consoling pt who was emotionally labile and upset; RN stated pt was independent and ambulated hallway. No acute PT needs. Thank you for this consult.  Lillia Pauls, PT, DPT Acute Rehabilitation Services Office [phone]    Norval Morton 06/25/2022, 11:40 AM

## 2022-06-25 NOTE — Progress Notes (Signed)
Jamie Hansen 9:38 AM  Subjective: Patient doing well status post ERCP does have some hiccups and burping but abdomen is better and no new complaints and we discussed the procedure and answered all of her questions and discussed importance of follow-up as well and she is eating her breakfast  Objective: Vital signs stable afebrile no acute distress abdomen is soft essentially nontender bili decreased other liver tests essentially the same white count slight lower  Assessment: Status post ERCP with stone extraction sphincterotomy and stenting  Plan: Please call Dr. Marca Ancona this weekend if any question or problem hopefully she can go home soon and I instructed her to follow-up with me in the office in 1 to 2 weeks for repeat liver test symptom check and further discussion on repeat ERCP and possible dilation and/or further stenting [facility]  office [phone] After 5PM or if no answer call [phone]

## 2022-06-25 NOTE — Plan of Care (Signed)
Patient is currently sitting in bed with stable vitals. All meds have been given. Patient is set to be discharged. Plan of care is ongoing.  Problem: Education: Goal: Knowledge of General Education information will improve Description: Including pain rating scale, medication(s)/side effects and non-pharmacologic comfort measures Outcome: Progressing   Problem: Health Behavior/Discharge Planning: Goal: Ability to manage health-related needs will improve Outcome: Progressing   Problem: Clinical Measurements: Goal: Ability to maintain clinical measurements within normal limits will improve Outcome: Progressing Goal: Will remain free from infection Outcome: Progressing Goal: Diagnostic test results will improve Outcome: Progressing Goal: Respiratory complications will improve Outcome: Progressing Goal: Cardiovascular complication will be avoided Outcome: Progressing   Problem: Activity: Goal: Risk for activity intolerance will decrease Outcome: Progressing   Problem: Nutrition: Goal: Adequate nutrition will be maintained Outcome: Progressing   Problem: Coping: Goal: Level of anxiety will decrease Outcome: Progressing   Problem: Elimination: Goal: Will not experience complications related to bowel motility Outcome: Progressing Goal: Will not experience complications related to urinary retention Outcome: Progressing   Problem: Pain Managment: Goal: General experience of comfort will improve Outcome: Progressing   Problem: Safety: Goal: Ability to remain free from injury will improve Outcome: Progressing   Problem: Skin Integrity: Goal: Risk for impaired skin integrity will decrease Outcome: Progressing

## 2022-06-26 LAB — CULTURE, BLOOD (ROUTINE X 2)
Culture: NO GROWTH
Culture: NO GROWTH
Special Requests: ADEQUATE

## 2022-06-28 ENCOUNTER — Encounter (HOSPITAL_COMMUNITY): Payer: Self-pay | Admitting: Gastroenterology

## 2024-02-06 ENCOUNTER — Other Ambulatory Visit: Payer: Self-pay

## 2024-02-06 ENCOUNTER — Emergency Department (HOSPITAL_BASED_OUTPATIENT_CLINIC_OR_DEPARTMENT_OTHER)
Admission: EM | Admit: 2024-02-06 | Discharge: 2024-02-06 | Disposition: A | Discharge status: Home / Self Care | Attending: Emergency Medicine | Admitting: Emergency Medicine

## 2024-02-06 ENCOUNTER — Encounter (HOSPITAL_BASED_OUTPATIENT_CLINIC_OR_DEPARTMENT_OTHER): Payer: Self-pay | Admitting: Emergency Medicine

## 2024-02-06 DIAGNOSIS — Z91040 Latex allergy status: Secondary | ICD-10-CM | POA: Insufficient documentation

## 2024-02-06 DIAGNOSIS — B349 Viral infection, unspecified: Secondary | ICD-10-CM | POA: Insufficient documentation

## 2024-02-06 DIAGNOSIS — R059 Cough, unspecified: Secondary | ICD-10-CM | POA: Diagnosis present

## 2024-02-06 LAB — RESP PANEL BY RT-PCR (RSV, FLU A&B, COVID)  RVPGX2
Influenza A by PCR: NEGATIVE
Influenza B by PCR: NEGATIVE
Resp Syncytial Virus by PCR: NEGATIVE
SARS Coronavirus 2 by RT PCR: NEGATIVE

## 2024-02-06 NOTE — ED Notes (Signed)
 Discharge instructions reviewed with patient. Patient verbalizes understanding, no further questions at this time. Medications and follow up information provided. No acute distress noted at time of departure.

## 2024-02-06 NOTE — ED Provider Notes (Signed)
°  [facility] Provider Note   CSN: [id number] Arrival date & time: 02/06/24  1529     Patient presents with: URI  HPI Jamie Hansen is a 28 y.o. female presenting for cough body aches rhinorrhea and mild headache that all started this morning.  Endorses sick contacts at work with similar symptoms.  Took ibuprofen  around 7 this morning.  Denies nausea vomiting diarrhea abdominal pain.  Denies chest pain or shortness of breath.    URI      Prior to Admission medications  Medication Sig Start Date End Date Taking? Authorizing Provider  iron  polysaccharides (NIFEREX) 150 MG capsule Take 1 capsule (150 mg total) by mouth daily. 06/26/22   Rai, Nydia POUR, MD    Allergies: Latex    Review of Systems See HPI  Updated Vital Signs BP (!) 135/91 (BP Location: Right Arm)   Pulse 98   Temp 98.2 F (36.8 C)   Resp 20   Ht 4' 11 (1.499 m)   Wt 56.7 kg   SpO2 100%   BMI 25.25 kg/m   Physical Exam Vitals and nursing note reviewed.  HENT:     Head: Normocephalic and atraumatic.     Mouth/Throat:     Mouth: Mucous membranes are moist.  Eyes:     General:        Right eye: No discharge.        Left eye: No discharge.     Conjunctiva/sclera: Conjunctivae normal.  Cardiovascular:     Rate and Rhythm: Normal rate and regular rhythm.     Pulses: Normal pulses.     Heart sounds: Normal heart sounds.  Pulmonary:     Effort: Pulmonary effort is normal.     Breath sounds: Normal breath sounds. No wheezing or rales.  Abdominal:     General: Abdomen is flat.     Palpations: Abdomen is soft.  Skin:    General: Skin is warm and dry.  Neurological:     General: No focal deficit present.  Psychiatric:        Mood and Affect: Mood normal.     (all labs ordered are listed, but only abnormal results are displayed) Labs Reviewed  RESP PANEL BY RT-PCR (RSV, FLU A&B, COVID)  [id number]    EKG: None  Radiology: No results  found.   Procedures   Medications Ordered in the ED - No data to display                                  Medical Decision Making  28 year old well-appearing female presenting for URI symptoms.  Exam was unremarkable.  Blood pressure slightly elevated but otherwise vitals are normal.  Overall she looks well, nontoxic hemodynamically stable in no acute distress.  But this is likely a viral illness.  Considered sepsis, pneumonia, meningitis, other but unlikely given how well she appears.  Advised abortive treatment at home.  Discussed return precautions.  Discharge.     Final diagnoses:  Viral illness    ED Discharge Orders     None          Lang Norleen POUR, PA-C 02/06/24 1708

## 2024-02-06 NOTE — Discharge Instructions (Signed)
 Evaluation today was overall reassuring.  Suspect this is a viral illness.  As we discussed treatment is supportive.  Encouraged rest and hydration with water and Gatorade you can take Tylenol  and ibuprofen  for symptomatic relief.  If your symptoms worsen please return to the ED for further evaluation.

## 2024-02-06 NOTE — ED Triage Notes (Signed)
 Pt declined interpreter- c/o cough, body aches, rhinorrhea, headache since this AM.   Took ibuprofen  appx 0700. Denies n/v/d.
